# Patient Record
Sex: Male | Born: 1935 | Race: White | Hispanic: No | Marital: Married | Smoking: Former smoker
Health system: Southern US, Community
[De-identification: ages and names within clinical notes are randomized; demographics above are authoritative.]

## PROBLEM LIST (undated history)

## (undated) DIAGNOSIS — R05 Cough: Secondary | ICD-10-CM

## (undated) DIAGNOSIS — I639 Cerebral infarction, unspecified: Secondary | ICD-10-CM

## (undated) DIAGNOSIS — I219 Acute myocardial infarction, unspecified: Secondary | ICD-10-CM

## (undated) DIAGNOSIS — K219 Gastro-esophageal reflux disease without esophagitis: Secondary | ICD-10-CM

## (undated) DIAGNOSIS — F419 Anxiety disorder, unspecified: Secondary | ICD-10-CM

## (undated) DIAGNOSIS — R058 Other specified cough: Secondary | ICD-10-CM

## (undated) DIAGNOSIS — J40 Bronchitis, not specified as acute or chronic: Secondary | ICD-10-CM

## (undated) DIAGNOSIS — M2550 Pain in unspecified joint: Secondary | ICD-10-CM

## (undated) DIAGNOSIS — E119 Type 2 diabetes mellitus without complications: Secondary | ICD-10-CM

## (undated) DIAGNOSIS — J449 Chronic obstructive pulmonary disease, unspecified: Secondary | ICD-10-CM

## (undated) DIAGNOSIS — E785 Hyperlipidemia, unspecified: Secondary | ICD-10-CM

## (undated) DIAGNOSIS — M1990 Unspecified osteoarthritis, unspecified site: Secondary | ICD-10-CM

## (undated) HISTORY — DX: Anxiety disorder, unspecified: F41.9

## (undated) HISTORY — DX: Chronic obstructive pulmonary disease, unspecified: J44.9

## (undated) HISTORY — DX: Acute myocardial infarction, unspecified: I21.9

## (undated) HISTORY — DX: Pain in unspecified joint: M25.50

## (undated) HISTORY — DX: Cough: R05

## (undated) HISTORY — PX: LUMBAR DISC SURGERY: SHX700

## (undated) HISTORY — DX: Unspecified osteoarthritis, unspecified site: M19.90

## (undated) HISTORY — DX: Bronchitis, not specified as acute or chronic: J40

## (undated) HISTORY — DX: Hyperlipidemia, unspecified: E78.5

## (undated) HISTORY — DX: Other specified cough: R05.8

---

## 1998-04-30 ENCOUNTER — Ambulatory Visit (HOSPITAL_COMMUNITY): Admission: RE | Admit: 1998-04-30 | Discharge: 1998-04-30 | Payer: Self-pay | Admitting: Orthopedic Surgery

## 1998-05-14 ENCOUNTER — Observation Stay (HOSPITAL_COMMUNITY): Admission: RE | Admit: 1998-05-14 | Discharge: 1998-05-15 | Payer: Self-pay | Admitting: Orthopedic Surgery

## 1998-09-28 ENCOUNTER — Encounter: Payer: Self-pay | Admitting: Orthopedic Surgery

## 1998-10-02 ENCOUNTER — Encounter: Payer: Self-pay | Admitting: Orthopedic Surgery

## 1998-10-02 ENCOUNTER — Ambulatory Visit (HOSPITAL_COMMUNITY): Admission: RE | Admit: 1998-10-02 | Discharge: 1998-10-02 | Payer: Self-pay | Admitting: Orthopedic Surgery

## 1998-10-04 ENCOUNTER — Encounter: Payer: Self-pay | Admitting: Orthopedic Surgery

## 1998-10-04 ENCOUNTER — Observation Stay (HOSPITAL_COMMUNITY): Admission: RE | Admit: 1998-10-04 | Discharge: 1998-10-05 | Payer: Self-pay | Admitting: Orthopedic Surgery

## 1999-05-08 ENCOUNTER — Ambulatory Visit (HOSPITAL_COMMUNITY): Admission: RE | Admit: 1999-05-08 | Discharge: 1999-05-08 | Payer: Self-pay | Admitting: Cardiology

## 1999-09-25 ENCOUNTER — Encounter: Payer: Self-pay | Admitting: *Deleted

## 1999-09-26 ENCOUNTER — Ambulatory Visit: Admission: RE | Admit: 1999-09-26 | Discharge: 1999-09-26 | Payer: Self-pay | Admitting: *Deleted

## 1999-10-01 ENCOUNTER — Encounter: Payer: Self-pay | Admitting: *Deleted

## 1999-10-02 ENCOUNTER — Inpatient Hospital Stay (HOSPITAL_COMMUNITY): Admission: RE | Admit: 1999-10-02 | Discharge: 1999-10-04 | Payer: Self-pay | Admitting: *Deleted

## 1999-12-25 ENCOUNTER — Ambulatory Visit (HOSPITAL_COMMUNITY): Admission: RE | Admit: 1999-12-25 | Discharge: 1999-12-25 | Payer: Self-pay | Admitting: *Deleted

## 1999-12-25 ENCOUNTER — Encounter: Payer: Self-pay | Admitting: *Deleted

## 2001-09-15 DIAGNOSIS — I219 Acute myocardial infarction, unspecified: Secondary | ICD-10-CM

## 2001-09-15 HISTORY — PX: CORONARY ARTERY BYPASS GRAFT: SHX141

## 2001-09-15 HISTORY — DX: Acute myocardial infarction, unspecified: I21.9

## 2001-09-15 HISTORY — PX: FEMORAL ARTERY - FEMORAL ARTERY BYPASS GRAFT: SUR179

## 2002-04-15 ENCOUNTER — Encounter: Payer: Self-pay | Admitting: Cardiothoracic Surgery

## 2002-04-16 ENCOUNTER — Inpatient Hospital Stay (HOSPITAL_COMMUNITY): Admission: AD | Admit: 2002-04-16 | Discharge: 2002-04-23 | Payer: Self-pay | Admitting: Cardiology

## 2002-04-18 ENCOUNTER — Encounter: Payer: Self-pay | Admitting: Thoracic Surgery (Cardiothoracic Vascular Surgery)

## 2002-04-19 ENCOUNTER — Encounter: Payer: Self-pay | Admitting: Thoracic Surgery (Cardiothoracic Vascular Surgery)

## 2002-04-20 ENCOUNTER — Encounter: Payer: Self-pay | Admitting: Thoracic Surgery (Cardiothoracic Vascular Surgery)

## 2002-06-27 ENCOUNTER — Encounter (HOSPITAL_COMMUNITY): Admission: RE | Admit: 2002-06-27 | Discharge: 2002-09-25 | Payer: Self-pay | Admitting: Cardiology

## 2002-09-26 ENCOUNTER — Encounter (HOSPITAL_COMMUNITY): Admission: RE | Admit: 2002-09-26 | Discharge: 2002-09-29 | Payer: Self-pay | Admitting: Cardiology

## 2002-09-29 ENCOUNTER — Encounter (HOSPITAL_COMMUNITY): Admission: RE | Admit: 2002-09-29 | Discharge: 2002-12-28 | Payer: Self-pay | Admitting: Cardiology

## 2003-01-14 ENCOUNTER — Encounter (HOSPITAL_COMMUNITY): Admission: RE | Admit: 2003-01-14 | Discharge: 2003-04-14 | Payer: Self-pay | Admitting: Cardiology

## 2003-04-16 ENCOUNTER — Encounter (HOSPITAL_COMMUNITY): Admission: RE | Admit: 2003-04-16 | Discharge: 2003-07-15 | Payer: Self-pay | Admitting: Cardiology

## 2003-07-17 ENCOUNTER — Encounter (HOSPITAL_COMMUNITY): Admission: RE | Admit: 2003-07-17 | Discharge: 2003-10-15 | Payer: Self-pay | Admitting: Cardiology

## 2003-09-16 HISTORY — PX: TOTAL HIP ARTHROPLASTY: SHX124

## 2003-10-02 ENCOUNTER — Inpatient Hospital Stay (HOSPITAL_COMMUNITY): Admission: RE | Admit: 2003-10-02 | Discharge: 2003-10-07 | Payer: Self-pay | Admitting: Orthopedic Surgery

## 2004-01-30 ENCOUNTER — Encounter (HOSPITAL_COMMUNITY): Admission: RE | Admit: 2004-01-30 | Discharge: 2004-04-29 | Payer: Self-pay | Admitting: Cardiology

## 2004-05-16 ENCOUNTER — Encounter (HOSPITAL_COMMUNITY): Admission: RE | Admit: 2004-05-16 | Discharge: 2004-08-14 | Payer: Self-pay | Admitting: Cardiology

## 2004-06-12 ENCOUNTER — Ambulatory Visit (HOSPITAL_COMMUNITY): Admission: RE | Admit: 2004-06-12 | Discharge: 2004-06-12 | Payer: Self-pay | Admitting: Gastroenterology

## 2004-06-12 ENCOUNTER — Encounter (INDEPENDENT_AMBULATORY_CARE_PROVIDER_SITE_OTHER): Payer: Self-pay | Admitting: *Deleted

## 2004-08-15 ENCOUNTER — Encounter (HOSPITAL_COMMUNITY): Admission: RE | Admit: 2004-08-15 | Discharge: 2004-11-13 | Payer: Self-pay | Admitting: Cardiology

## 2004-12-14 ENCOUNTER — Encounter (HOSPITAL_COMMUNITY): Admission: RE | Admit: 2004-12-14 | Discharge: 2005-03-14 | Payer: Self-pay | Admitting: Cardiology

## 2005-02-26 ENCOUNTER — Ambulatory Visit (HOSPITAL_COMMUNITY): Admission: RE | Admit: 2005-02-26 | Discharge: 2005-02-26 | Payer: Self-pay | Admitting: Endocrinology

## 2005-03-18 ENCOUNTER — Encounter (HOSPITAL_COMMUNITY): Admission: RE | Admit: 2005-03-18 | Discharge: 2005-06-16 | Payer: Self-pay | Admitting: Cardiology

## 2005-06-17 ENCOUNTER — Encounter (HOSPITAL_COMMUNITY): Admission: RE | Admit: 2005-06-17 | Discharge: 2005-09-14 | Payer: Self-pay | Admitting: Cardiology

## 2005-09-16 ENCOUNTER — Encounter (HOSPITAL_COMMUNITY): Admission: RE | Admit: 2005-09-16 | Discharge: 2005-12-15 | Payer: Self-pay | Admitting: Cardiology

## 2005-12-16 ENCOUNTER — Encounter (HOSPITAL_COMMUNITY): Admission: RE | Admit: 2005-12-16 | Discharge: 2006-03-16 | Payer: Self-pay | Admitting: Cardiology

## 2006-03-17 ENCOUNTER — Encounter (HOSPITAL_COMMUNITY): Admission: RE | Admit: 2006-03-17 | Discharge: 2006-05-20 | Payer: Self-pay | Admitting: Cardiology

## 2008-05-26 ENCOUNTER — Ambulatory Visit: Payer: Self-pay | Admitting: Internal Medicine

## 2008-05-26 DIAGNOSIS — F411 Generalized anxiety disorder: Secondary | ICD-10-CM | POA: Insufficient documentation

## 2008-05-26 DIAGNOSIS — J449 Chronic obstructive pulmonary disease, unspecified: Secondary | ICD-10-CM | POA: Insufficient documentation

## 2008-07-25 ENCOUNTER — Ambulatory Visit: Payer: Self-pay | Admitting: Internal Medicine

## 2009-12-24 ENCOUNTER — Ambulatory Visit: Payer: Self-pay | Admitting: Surgery

## 2010-02-04 ENCOUNTER — Ambulatory Visit: Payer: Self-pay | Admitting: Surgery

## 2011-01-28 NOTE — Assessment & Plan Note (Signed)
OFFICE VISIT   ROLLINS, WRIGHTSON  DOB:  1935/06/15                                       02/04/2010  EAVWU#:[id number]   Patient returns today for follow-up of bilateral claudication.  He is a  former patient of Dr. Madilyn Fireman, who has been having calf pain for the past  6 months.  He is having difficulty to get to his mailbox, which is 75  feet away.  The right leg is equal to the left.  His pain is alleviated  with resting.  He has a right-to-left femoral-femoral bypass graft  placed by Madilyn Fireman in 2001 for a left external iliac occlusion.  He has a  known contrast reaction consisting of itching and eye swelling.  He has  had a subsequent catheterization, where premedication prevented him from  having a problem.  When I saw him last, I had given him a prescription  for Pletal to see how this would manage his symptoms.  He said with  taking the medicine, he did have relief of his symptoms.  However, he  could not continue the medicine due to headaches.   PHYSICAL EXAMINATION:  Heart rate is 88, blood pressure 122/68, O2 sat  98%.  Generally, he is well-appearing in no distress.  HEENT:  Within  normal limits.  Respirations are nonlabored.  Extremities are warm and  well-perfused.  No ulceration.  Pedal pulses are not palpable.  Neuro:  He has a nonfocal exam.   DIAGNOSTIC STUDIES:  I have independently reviewed his ultrasound.  This  reveals patent right-to-left femoral-femoral bypass graft.  There are  elevated velocities within the right superficial femoral artery.  ABI on  the right is 0.81.  On the left is 0.85.   ASSESSMENT:  Bilateral claudication.   PLAN:  Since the patient had a good response to full-dose Pletal but had  to come off the medication secondary to side effects, I feel like it is  reasonable to proceed with cutting the dose in half and seeing if this  helps to manage his symptoms.  I told him to give this a 3-week period,  assuming he does  not have any further side effects and to see how his  symptoms are.  If he is still not tolerating this, we will schedule him  for an arteriogram with a bilateral runoff.  He will need to be  premedicated with prednisone, Benadryl and Pepcid prior to this  procedure to prevent his contrast reaction.     Jorge Ny, MD  Electronically Signed   VWB/MEDQ  D:  02/04/2010  T:  02/05/2010  Job:  [id number]   cc:   Georga Hacking, M.D.  Veverly Fells. Altheimer, M.D.

## 2011-01-28 NOTE — Procedures (Signed)
BYPASS GRAFT EVALUATION   INDICATION:  Follow up fem-fem bypass graft.   HISTORY:  Diabetes:  No.  Cardiac:  CABG.  Hypertension:  Yes.  Smoking:  Previous.  Previous Surgery:  Fem-fem bypass graft in 2001.   SINGLE LEVEL ARTERIAL EXAM                               RIGHT              LEFT  Brachial:                    149                157  Anterior tibial:             87                 133  Posterior tibial:            127                127  Peroneal:  Ankle/brachial index:        0.81               0.85   PREVIOUS ABI:  Date: 12/24/09  RIGHT:  0.87  LEFT:  0.90   LOWER EXTREMITY BYPASS GRAFT DUPLEX EXAM:   DUPLEX:  Patent right fem-fem bypass graft.  Elevated velocities noted  in the right SFA in the proximal region of 412 cm/s and in the mid  region of 298 cm/s.   IMPRESSION:  1. Ankle brachial indices bilaterally suggest mild disease with      biphasic waveforms on the left and monophasic waveforms on the      right.  2. Patent femoral-femoral bypass graft with elevated velocities in the      right superficial femoral artery proximal and mid section, as noted      above.   ___________________________________________  V. Charlena Cross, MD   NT/MEDQ  D:  02/04/2010  T:  02/04/2010  Job:  [id number]

## 2011-01-28 NOTE — Assessment & Plan Note (Signed)
OFFICE VISIT   Ronald Frank, Ronald Frank  DOB:  03-22-1935                                       12/24/2009  ZOXWR#:[id number]   REASON FOR VISIT:  Bilateral leg pain.   HISTORY:  Patient is a very pleasant 76 year old gentleman, former  patient of Dr. Madilyn Fireman that I am seeing at the request of Dr. Leslie Dales  for bilateral leg pain.  The patient states that for the past 6 months,  he has been having calf cramping, only when he walks out to his mailbox,  which is approximately 75 feet away.  The right leg cramping is equal to  that in the left leg.  It is alleviated with rest.  The patient has a  history of undergoing a right-to-left femoral-femoral bypass graft by  Dr. Madilyn Fireman in 2001 for left external iliac occlusion.  During the time of  his procedure in 2001, he did have a contrast reaction, which consist of  itching and eye-swelling.  It was treated with Benadryl.  He has  subsequent had a cardiac catheterization, which he was premedicated for  and did not have any problems.   The patient has a history of coronary artery disease and suffered a  heart attack in 2003, at which time he underwent open heart surgery.  He  also suffers from COPD.  He is a diabetic, which is managed medically.  His blood sugars run in the 90-120 range.  Diabetes was diagnosed in  2010.  He suffers from hyperlipidemia, which is managed medically.   REVIEW OF SYSTEMS:  GENERAL:  Negative fevers, chills, weight gain,  weight loss.  CARDIAC:  Positive chest pain and shortness breath on exertion.  PULMONARY:  Positive for bronchitis.  GI:  Negative.  GU:  Negative.  VASCULAR:  Pain in the legs with walking.  NEURO:  Positive for dizziness.  MUSCULOSKELETAL:  Positive for arthritis and muscle pain.  PSYCH:  Positive for anxiety.  SKIN:  Positive for a rash on his arms.  EENT:  Negative.  HEME:  Negative.   PAST MEDICAL HISTORY:  Positive for type 2 diabetes, hyperlipidemia,  coronary  artery disease, peripheral artery disease, COPD, vitamin D  deficiency,  benign prostatic hypertrophy, osteoarthritis,  diverticulitis, chronic insomnia.   FAMILY HISTORY:  Negative for cardiovascular disease at an early age.   SOCIAL HISTORY:  He is married with 2 children.  He works as a [employer] officer.  He has a history smoking, quit in 2003.   ALLERGIES:  Erythromycin, Lipitor, Actos, Lexapro, Contrast dye.   MEDICATIONS:  Please see medical record.   PHYSICAL EXAMINATION:  Heart rate is 85, blood pressure is 135/79, O2  sats are 98%.  Generally, he is well-appearing in no distress.  HEENT:  Within normal limits.  Lungs are clear bilaterally.  Cardiovascular:  Regular rate and rhythm.  Abdomen:  Soft, nontender, obese.  Musculoskeletal is without major deformities.  Neuro:  He has no focal  weakness or paresthesias.  Vascular:  He has a palpable femoral-femoral  bypass graft.  Feet are warm and well-perfused.  No ulceration.   Diagnostic studies also have been independently reviewed.  ABI on the  right is 0.87.  The left is 0.90.  He has monophasic waveforms on the  right and biphasic on the left.   ASSESSMENT:  Bilateral leg pain.  PLAN:  The patient's description of his symptoms appear classic for  peripheral vascular disease.  However, by ultrasound, his disease is not  that significant.  He has a history of a femoral-femoral bypass graft  which remains patent.  I suspect that if we exercise the patient, his  ABIs would go down.  Regardless, the severity of his symptoms is not  lifestyle-limiting and with his contrast dye allergy, I have recommended  treating him medically first.  I am going to add cilostazol to his  medication list.  He does not have a history of congestive failure and  has no signs of congestive failure at this time.  I am going to give him  6 week's worth of a trial and then reevaluate at that time.  When he  comes back in for reevaluation, he will have a  formal arterial study to  evaluate for stenosis within his bypass graft as well as for inflow-  outflow disease.  If he has had no relief from the cilostazol and wishes  to have an intervention done, we can consider getting an arteriogram in  6 weeks.     Jorge Ny, MD  Electronically Signed   VWB/MEDQ  D:  12/24/2009  Frank:  12/25/2009  Job:  [id number]   cc:   Georga Hacking, M.D.  Veverly Fells. Altheimer, M.D.

## 2011-01-31 NOTE — Op Note (Signed)
Ronald Frank, RORIE NO.:  [id number]   MEDICAL RECORD NO.:  [id number]                   PATIENT TYPE:  INP   LOCATION:  2020                                 FACILITY:  [facility]   PHYSICIAN:  Salvatore Decent. Dorris Fetch, M.D.         DATE OF BIRTH:  Feb 19, 1935   DATE OF PROCEDURE:  04/18/2002  DATE OF DISCHARGE:                                 OPERATIVE REPORT   PREOPERATIVE DIAGNOSIS:  Left main disease with unstable angina.   POSTOPERATIVE DIAGNOSIS:  Left main disease with unstable angina.   PROCEDURE:  Median sternotomy, extracorporeal circulation, coronary artery  bypass grafting x4  (left internal mammary to left anterior descending,  saphenous vein graft to ramus intermedius, sequential saphenous vein graft  to first and second obtuse marginal).   SURGEON:  Salvatore Decent. Dorris Fetch, M.D.   ASSISTANT:  Jody P. Diamond Nickel.   ANESTHESIA:  General.   FINDINGS:  Good quality targets, good quality conduits.  Left PDA too small  to graft.   INDICATIONS:  Mr. Holderness is a 76 year old gentleman who presented with  unstable angina.  He underwent cardiac catheterization which revealed a left  dominant circulation with significant left main stenosis.  He also had an  ostial circumflex lesion.  The patient was referred for coronary artery  bypass graft.  The indications, risks, benefits and alternative treatments  were discussed in detail with the patient.  He understood and accepted the  risks associated with coronary artery bypass grafting and agreed to proceed.   DESCRIPTION OF PROCEDURE:  Mr. Butler was brought to the preop holding area  on April 18, 2002.  Lines were placed to monitor, central venous and  pulmonary artery pressure.  EKG leads were placed for continuous telemetry.  The patient was taken to the operating room, anesthetized and intubated.  A  Foley catheter was placed.  Intravenous antibiotics were administered.  The  chest, abdomen,  and legs were prepared and draped in the usual fashion.   A median sternotomy then was  performed.  The left internal mammary artery  was harvested using standard technique.  Simultaneously an incision was made  in the medial aspect of the right leg and the greater saphenous vein was  harvested from the ankle to the knee.  Of note, the saphenous vein and  mammary artery were of good quality.  The patient was fully heparinized  prior to dilating the distal end of the mammary artery.   The pericardium was opened, and the ascending aorta was inspected and  palpated.  There was no palpable atherosclerotic disease.  The aorta was  cannulated via concentric 2-0 Ethibond nonpledgeted pursestring sutures.  A  dual staged venous cannula was placed via pursestring suture in the right  atrial appendage.  Cardiopulmonary bypass was instituted, and the patient  was cooled at 32 degrees Celsius.  The coronary arteries were inspected and  there anastomotic sites chosen.  The conduits were inspected and cut to  length.  foam pad was placed in the pericardium to protect the left phrenic nerves.  A temperature probe was placed in the myocardial septum and a cardioplegia  cannula was placed in the ascending aorta.   The aorta was cross-clamped.  The left ventricle was entered via the aortic  root.  Cardiac arrest then was achieved with a combination of cold antegrade  cardioplegia. and topical iced saline.  After achieving complete diastolic  arrest and myocardial septal temperature of 12 degrees Celsius, the  following distal anastomosis were performed.   First, a reverse saphenous vein graft was placed end-to-side to the ramus  intermedius.  This was a 1.5 mm good quality vessel.  The anastomosis was  performed with a running 7-0 Prolene suture in an end-to-side fashion.  At  its completion, this anastomosis and all subsequent ones were probed  proximally and distally to verify patency.  The graft  flushed easily.  Cardioplegia was administered, and there was good hemostasis.   Next, a reverse saphenous vein graft was placed sequentially to obtuse  marginals I and II.  These arose as a large common dominant lateral branch.  There was plaque at the bifurcation of this vessel involving the takeoff of  OM-I.  Therefore both distal branches were grated.  A side-to-side  anastomosis was performed to the first was performed to the first branch  using a running 7-0 Prolene suture, and an end-to-side was performed to the  second branch.  Both were performed using running 7-0 Prolene sutures.  The  graft flushed easily.  Again, cardioplegia was administered was  administered, and there was good hemostasis.   Next, the left internal mammary artery was brought through the window in the  pericardium and the distal end of the mammary artery was spatulated.  It was  anastomosed end-to-side to the distal LAD.  The LAD was a diffusely diseased  vessel.  There was palpable plaque throughout its course.  The site of the  anastomosis was of good quality.  It was 2 mm in diameter.  The mammary  artery was  approximately 2.5 mm in diameter.  The anastomosis was performed  with a running 8-0 Prolene suture in an end-to-side fashion.  After  completion of the mammary to the LAD anastomosis, the clamps, the bulldog  clamps were briefly removed to check for hemostasis but then was replaced.   The vein grafts were cut to length.  The cardioplegia cannula was removed  from the ascending aorta.  The proximal vein graft anastomosis were  performed to 4.0 mm punch aortotomies with running 6-0 Prolene sutures.  At  the completion of the final proximal anastomosis, the patient was placed in  Trendelenburg position.  The bulldog clamp was removed from the mammary  artery.  Immediate and rapid septal rewarming was noted.  Lidocaine was administered.  The aortic root was deaired.  The cross-clamp was removed.  The  total cross-clamp time was 57 minutes.   All proximal and distal anastomoses were inspected for hemostasis.  Epicardial pacing wires were placed on the right ventricle and right atrium.  The patient did not require defibrillation but was bradycardiac and required  DDD sequential pacing.  After the patient had been rewarmed to a core  temperature of 37 degrees Celsius, he was weaned from cardiopulmonary bypass  without difficulty.  He was DDD paced for rate and then on no inotropic  support at the time of  separation from bypass.  Total bypass time was 111  minutes.   The initial cardiac index was greater than 2 liters per minute per sq m. and  the patient remained hemodynamically stable throughout the post bypass  period.  A test dose of Protamine was administered and was well tolerated.  The atrial and aortic cannulae were removed.  The remainder of the Protamine  was administered without incident.  The chest was irrigated with one liter  of warm normal saline containing 1 g of vancomycin.  Hemostasis was  achieved.  The left pleural and two mediastinal pleural and two mediastinal  chest tubes were placed for through separate subcostal incisions.  The  pericardium was loosely reapproximated with interrupted 3-0 silk sutures  that came together easily without tension.  The sternum was closed with  heavy gauge interrupted stainless steel wires.  The pectoralis fascia was  closed with a running #1 Vicryl suture.  The subcutaneous tissue was closed  with a running 2-0  Vicryl suture, and the skin was closed with a 3-0 Vicryl subcuticular  suture.  All sponge, needle and instrument counts were correct at the end of  the procedure.  The patient was taken from the operating room to the  surgical intensive care unit intubated and in stable condition.                                               Salvatore Decent Dorris Fetch, M.D.    SCH/MEDQ  D:  04/18/2002  T:  04/22/2002  Job:  [id number]   cc:    W. Ashley Royalty., M.D.

## 2011-01-31 NOTE — Discharge Summary (Signed)
[facility]  Patient:    Ronald Frank                         MRN: [id number] Adm. Date:  [id number] Disc. Date: 10/04/99 Attending:  Melvenia Needles Dictator:   Loura Pardon, P.A. CC:         Denman George, M.D.             Darden Palmer., M.D.             Veverly Fells. Altheimer, M.D.                           Discharge Summary  DATE OF BIRTH:  Jun 28, 1935  FINAL DIAGNOSES: 1. 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 2. Left lower extremity claudication.  SECONDARY DIAGNOSES: 1. History of long-term, continued tobacco habituation. 2. Degenerative joint disease. 3. Anxiety. 4. Insomnia. 5. Hypercholesterolemia. 6. Status post lumbar surgery x 2 in 1999 and in 2000. 7. 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  PROCEDURES:  October 02, 1999, right-to-left femoral-to-femoral bypass with placement of 8 mm Sulzer vascular Dacron graft.  SURGEON:  Dr. Denman George.  DISPOSITION:  Ronald Frank is judged a suitable candidate for discharge on  postoperative day #2.  He has been afebrile in the postoperative period.  His appetite is good.  He has full GI tract function.  His mental status is clear. His incisions are healing nicely in both groins.  He has palpable pedal pulses bilaterally.  He is ambulating independently.  DISCHARGE MEDICATIONS: 1. Percocet 5/325 1-2 tablets p.o. q.4-6h. p.r.n. pain. 2. Vioxx 25 mg daily. 3. Nicoderm patch 21 mg per day, apply daily to the upper outer arm. 4. Pravachol 40 mg at bedtime. 5. Ambien 10 mg at bedtime. 6. Viagra as needed. 7. Lorazepam 0.5 mg p.o. b.i.d.  ACTIVITY:  Ambulation as tolerated.  He is asked not to drive while taking Percocet or Vicodin.  DIET:  Low sodium, low cholesterol.  WOUND CARE:  He may shower daily, keeping his incisions clean and dry.  FOLLOW-UP:  Office visit [facility] on Monday, October 14, 1999, at 10 a.m. for staple removal.  He has an office visit scheduled with Dr. Madilyn Fireman for two weeks after discharge.  This will be arranged October 14, 1999.  HISTORY OF PRESENT ILLNESS:  Ronald Frank is a 76 year old male with a history of left lower extremity claudication.  He presented in October 2000, with complaints of claudication symptoms originally.  Doppler studies showed ankle brachial indices of 0.63 and 1.0 on the right.  He was scheduled for an angiogram to further evaluate the condition.  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  An angiogram was performed September 25, 1999.  The study showed left iliac artery occlusion, which is consistent with left lower extremity claudication.  Ankle brachial indices were retaken on October 01, 1999.  On the left they were 0.51 nd on the right 1.0.  His claudication symptoms have increased since October 2000.  They start after he walks about one block.  He has occasional rest pain and pain at night.  He presents for surgery electively on October 02, 1999.  HOSPITAL COURSE:  Ronald Frank was admitted to  [facility] on October 02, 1999.  He underwent right-to-left femoral-to-femoral bypass the same day.  He tolerated the procedure well, and was transferred to the recovery room in stable and satisfactory condition.  His postoperative course is as mentioned in the discharge disposition.  He has palpable pedal pulses.  He is ambulating independently.  He has no recurrence of left lower extremity claudication symptoms. His graft has a palpable pulse.  He is taking all nourishment well and tolerating it.  His mental status has been clear.  His incisions are healing nicely.  He will go home on the medications as dictated, with follow-up with Dr. Madilyn Fireman. DD:  10/03/99 TD:  10/04/99 Job: [id number]

## 2011-01-31 NOTE — Cardiovascular Report (Signed)
NAMEROMELO, SCIANDRA NO.:  [id number]   MEDICAL RECORD NO.:  [id number]                   PATIENT TYPE:  INP   LOCATION:  2303                                 FACILITY:  [facility]   PHYSICIAN:  W. Ashley Royalty., M.D.         DATE OF BIRTH:  1935-03-25   DATE OF PROCEDURE:  04/14/2002  DATE OF DISCHARGE:                              CARDIAC CATHETERIZATION   HISTORY:  A 76 year old male with peripheral vascular disease, cigarette  smoking, and a recent chest pain history that has occurred at rest.   PROCEDURE:  Left heart catheterization with coronary angiograms and left  ventriculogram.   COMMENTS ABOUT PROCEDURE:  The patient was premedicated with prednisone,  Pepcid, and Benadryl and tolerated the procedure well.  The procedure was  done through the right groin.  There was significant catheter  ventricularization and damping with both the standard 6 [place] and 5 [place]  catheters.  Multiple attempts were made, but there was severe damping and  ventricularization with all left coronary injections despite angulation and  views.  He tolerated the procedure well and following the procedure had good  hemostasis and peripheral pulses present.   HEMODYNAMIC DATA:  1. Aorta postcontrast 116/52.  2. LV postcontrast 116/8.   ANGIOGRAPHIC DATA::  1. Left ventriculogram:  Performed in the 30 degree RAO projection.  The     aortic valve was normal.  The mitral valve was normal. The left ventricle     appears normal in size.  The estimated ejection fraction is 55-60%.     Coronary arteries arise and distribute normally.  Calcifications noted in     the left coronary system.  This system is left dominant.  2. Left main coronary artery:  Diffuse 60-70% narrowing with calcification.  3. Left anterior descending:  There is moderate irregularity proximally.     There is moderate diffuse disease in the mid portion with a     calcification.  4. Circumflex  coronary artery dominant vessel:  There is an ostial 80%     stenosis.  The vessel is dominant with several marginal branches in the     posterolateral branch noted.   Right coronary artery is a nondominant vessel which is congenitally small  and __________ in right ventricular branches.    IMPRESSION:  1. Significant left main coronary artery disease  2. Ostial circumflex coronary artery disease with dominant circulation.   RECOMMENDATIONS:  Consideration of a coronary artery bypass grafting.                                               Darden Palmer., M.D.    WST/MEDQ  D:  04/14/2002  T:  04/19/2002  Job:  [id number]   cc:   Veverly Fells. Altheimer, M.D.  Cardiac Cath Lab

## 2011-01-31 NOTE — Discharge Summary (Signed)
Ronald Frank, Ronald Frank NO.:  [id number]   MEDICAL RECORD NO.:  [id number]                   PATIENT TYPE:  INP   LOCATION:  2020                                 FACILITY:  [facility]   PHYSICIAN:  Salvatore Decent. Dorris Fetch, M.D.         DATE OF BIRTH:  05/08/1935   DATE OF ADMISSION:  04/14/2002  DATE OF DISCHARGE:                                 DISCHARGE SUMMARY   ADMISSION DIAGNOSES:  Coronary artery disease.   SECONDARY DIAGNOSES:  1. Hypertension.  2. Postoperative anemia secondary to blood loss.  3. __________.   DISCHARGE DIAGNOSES:  Coronary artery disease.   HISTORY OF PRESENT ILLNESS:  The patient was admitted to [facility]  on April 14, 2002, and underwent cardiac catheterization.  Catheterization  revealed the patient had significant coronary artery disease.  Because of  this Dr. Dorris Fetch was consulted.  On April 18, 2002, the patient  underwent a coronary artery bypass graft x4 to the left internal mammary  artery anastomosed to the left anterior descending artery, saphenous vein  graft to the ramus artery and sequential saphenous vein graft to the first  and second obtuse marginal arteries.  No complications were noted during the  procedure.  Postoperatively the patient had a relatively uneventful hospital  course.  His hemoglobin and hematocrit were stable at 10 and 31,  respectively.  BUN and creatinine were 16 and 1.1.  He did have some  postoperative nausea.  This was alleviated on its own without any  intervention.  He was subsequently deemed stable for discharge home on  postoperative day #5, April 23, 2002.  No new medications at the time of  discharge.   DISCHARGE MEDICATIONS:  1. Vicodin 1-2 tablets p.o. q.4-6h. as needed for pain.  2. Aspirin 325 mg one daily.  3. Wellbutrin 150 mg one twice daily.  4. Toprol XL 25 mg one daily.  5. Pravachol 40 mg one daily.   DISCHARGE ACTIVITIES:  The patient was told no  driving, strenuous activity  or lifting heavy objects.  He was told to walk daily and continue bending  exercises.   DISCHARGE DIET:  Low-fat and low-salt.   WOUND CARE:  The patient was told he could shower and clean his incisions  with soap and water.   DISPOSITION:  To home.    FOLLOWUP APPOINTMENTS:  The patient was told to call his cardiologist, Dr.  __________, for a two-week appointment.  The patient was told to see Dr.  Dorris Fetch in his office in approximately three weeks.  He is also to call  and verify the time of his appointment.  He was told to bring a chest x-ray  with him.     Levin Erp. Steward, P.A.                      Salvatore Decent Dorris Fetch, M.D.    BGS/MEDQ  D:  04/23/2002  T:  04/27/2002  Job:  [id number]   cc:   Darden Palmer., M.D.

## 2011-01-31 NOTE — Discharge Summary (Signed)
NAMEEKANSH, SHERK NO.:  [id number]   MEDICAL RECORD NO.:  [id number]                   PATIENT TYPE:  INP   LOCATION:  0452                                 FACILITY:  [facility]   PHYSICIAN:  Georges Lynch. Darrelyn Hillock, M.D.             DATE OF BIRTH:  1934-12-26   DATE OF ADMISSION:  10/02/2003  DATE OF DISCHARGE:  10/07/2003                                 DISCHARGE SUMMARY   ADMISSION DIAGNOSES:  1. Degenerative arthritis, left hip.  2. Anxiety.  3. Coronary artery disease.  4. Chronic obstructive pulmonary disease.  5. Hypertension.  6. Benign prostatic hypertrophy.  7. Hypercholesterolemia.   DISCHARGE DIAGNOSES:  1. Degenerative arthritis, left hip, status post left total hip     arthroplasty.  2. Anxiety.  3. Coronary artery disease.  4. Chronic obstructive pulmonary disease.  5. Degenerative arthritis.  6. Hypertension.  7. Benign prostatic hypertrophy.  8. Hypercholesterolemia.   PROCEDURES:  The patient was taken to the operating room on October 02, 2003  to undergo a left total hip arthroplasty.  Surgeon was Ranee Gosselin, M.D.  Assistant was Marlowe Kays, M.D.  Surgery was performed under spinal  anesthesia.   LABORATORY DATA:  Admission CBC revealed WBC 7.6, RBC 4.33, hemoglobin 14.3,  hematocrit 41.6, platelet count 262.  Admission PT 12.3, INR 0.9, PTT 32.  Admission chemistry revealed sodium high at 148, potassium 4.8, chloride  111, CO2 29, glucose elevated at 143, BUN 17, creatinine 1.3, calcium 9.4,  albumin 4.1.  Admission urinalysis is normal.  The patient's blood type is O  positive, negative antibody screen.  Preoperative x-ray of the left hip  revealed advanced osteoarthritis of left hip.  Postoperative x-ray of the  left hip revealed prosthesis to be in good alignment.  Preoperative chest x-  ray and EKG - unable to locate on medical record.   HOSPITAL COURSE:  The patient was admitted to [facility] and  taken  to the operating room.  He underwent the above-stated procedure without  complication.  The patient tolerated the procedure well, was returned to the  recovery room, and then to the orthopedic floor to continue his  postoperative care.  PCA was ordered for pain control.  The patient's pain  was very well controlled, and he was weaned over to oral analgesics, and the  PCA was discontinued on postoperative day #2.  PT was consulted for gait  training ambulation.  The patient was very slow to progress, fatigued very  easily, and it was felt that he could possibly benefit from a __________  rehabilitation admission.  He was evaluated for admission, but, with no beds  available, he was unable to be transferred.  The patient's hemoglobin and  hematocrit were followed throughout his hospitalization.  He did have a  postoperative decrease in his hemoglobin, but that stabilized prior to blood  transfusion, and his discharge hemoglobin was  10.2.  The patient continued  to work with the therapist, and he was able to gain some strength, was able  to ambulate greater than 100 feet by postoperative day #5, and it was felt  that he could safely be discharged home.   DISPOSITION:  The patient was discharged home on October 07, 2003.   DISCHARGE MEDICATIONS:  1. Percocet 10/650, 1-2 q.6h. as needed for pain.  2. Robaxin 500 mg one q.4-6h. as needed for muscle spasm.  3. Coumadin 2 mg daily.  4. Keflex 500 mg four times daily for 1 week.   DIET:  As tolerated.   ACTIVITY:  Touchdown weightbearing with walker.  [facility] for home care.   FOLLOW UP:  The patient is scheduled to follow up with Dr. Darrelyn Hillock 2 weeks  from the date of surgery.   WOUND CARE:  The patient should keep the wound dry and clean.   CONDITION AT THE TIME OF DISCHARGE:  Improved.     Ronald Frank. Paitsel, P.A.                     Ronald A. Darrelyn Hillock, M.D.    Tilden Dome  D:  11/08/2003  T:  11/08/2003  Job:  [id number]

## 2011-01-31 NOTE — Op Note (Signed)
NAMERIKER, COLLIER NO.:  [id number]   MEDICAL RECORD NO.:  [id number]          PATIENT TYPE:  AMB   LOCATION:  ENDO                         FACILITY:  [facility]   PHYSICIAN:  Graylin Shiver, M.D.   DATE OF BIRTH:  1934/09/27   DATE OF PROCEDURE:  06/12/2004  DATE OF DISCHARGE:                                 OPERATIVE REPORT   PROCEDURE PERFORMED:  Colonoscopy.   INDICATIONS FOR PROCEDURE:  Screening.   Informed consent was obtained after explanation of the risks of bleeding,  infection, and perforation.   PREMEDICATIONS:  Fentanyl 100 mcg  IV, Versed 10 mg IV.   DESCRIPTION OF PROCEDURE:  With the patient in the left lateral decubitus  position, a rectal exam was performed and no masses were felt.  The Olympus  colonoscope was inserted into the rectum and advanced around the colon to  the cecum.  Cecal landmarks were identified.  The cecum and ascending colon  were normal.  The transverse colon normal.  The descending colon and sigmoid  revealed a few scattered diverticula.  The rectum revealed a 3 mm polyp  biopsied off with cold forceps.  The patient tolerated the procedure well  without complications.   IMPRESSION:  1.  Diverticulosis.  2.  Small rectal polyp, 2114.   PLAN:  The pathology will be checked.       SFG/MEDQ  D:  06/12/2004  T:  06/12/2004  Job:  [id number]   cc:   Veverly Fells. Altheimer, M.D.  [address]  [place]  [place] [address]  Fax: [phone]

## 2011-01-31 NOTE — Op Note (Signed)
NAMEJAGAR, LUA NO.:  [id number]   MEDICAL RECORD NO.:  [id number]                   PATIENT TYPE:  INP   LOCATION:  0452                                 FACILITY:  [facility]   PHYSICIAN:  Georges Lynch. Gioffre, M.D.             DATE OF BIRTH:  Jun 20, 1935   DATE OF PROCEDURE:  10/02/2003  DATE OF DISCHARGE:                                 OPERATIVE REPORT   PREOPERATIVE DIAGNOSIS:  Severe degenerative arthritis, left hip.   POSTOPERATIVE DIAGNOSIS:  Severe degenerative arthritis, left hip.   OPERATION:  Left total hip arthroplasty utilizing the [employer] system.  This is a porous coated-type prosthesis.  The sizes used were a size 56 mm  Trident cup with two cancellous bone screws for fixation.  The insert was a  10 degree polyethylene insert, 36 mm diameter.  The femoral component was a  size 10 primary Secure Fit Plus stem.  The C-tapered head was a +5 C-tapered  head.   SURGEON:  Georges Lynch. Darrelyn Hillock, M.D.   ASSISTANT:  Marlowe Kays, M.D.   DESCRIPTION OF PROCEDURE:  Under spinal anesthesia, routine orthopedic prep  and draping of the left hip was carried out.  The patient had 1 g of IV  Ancef.  The incision was made over the posterolateral aspect of the left  hip, the self-retaining retractor were inserted, bleeders identified and  cauterized.  I then incised the iliotibial band.  I then dissected the  distal gluteal muscles by blunt dissection.  Bleeders identified and  cauterized.  At all times great care was taken not to injure the sciatic  nerve.  Following this I did a partial release of the Zickel band.  I then  detached the external rotators in the usual fashion with great care taken to  protect the sciatic nerve.  Following this I did a capsulectomy, dislocated  the hip and amputated the femoral head at the appropriate neck length.  At  this time a guide pin was inserted down into the femoral canal.  An initial  drill hole was made, and  I then reamed and rasped the femoral shaft up to a  size 10 Secure Fit stem, porous-coated.  The distal tip was reamed to a  14.5.  Following this I then prepared the acetabulum by completing the  capsulectomy.  I then reamed the acetabulum up to a 54 mm in diameter.  I  then went through the appropriate trials in the usual fashion.  We finally  selected a +5 C-tapered head because it was the most stable, and it was a 36  mm in diameter head.  We then removed the trials, inserted our permanent  Trident cup.  I inserted the trial insert and went through trials once again  for positioning purposes.  We had excellent position and excellent  stability.  I then removed the trial insert and then utilized two cancellous  screws for fixation of the cup.  Following this I inserted a 10 degree  polyethylene insert at the appropriate angle.  I did utilize the bur to bur  down the medial wall of the acetabulum in order to get a nice secure fit  with the cup.  I then inserted the permanent cup, as I mentioned.  We then  went down and inserted our permanent size 10 Secure Fit femoral stem.  We  then went through trials once again and selected a +5 C-tapered head because  of stability purposes.  We then removed the trial and inserted our permanent  36 mm diameter +5 C-tapered head, reduced the hip after we cleared the  acetabulum, took the hip through motion, and we had excellent stability.  We  thoroughly  irrigated out the area.  I reattached the external rotators and closed the  main part of the wound in layers in the usual fashion.  The skin was closed  with metal staples.  A sterile Neosporin dressing was applied.  He had 1 g  of IV Ancef preop.                                               Ronald A. Darrelyn Hillock, M.D.    RAG/MEDQ  D:  10/02/2003  T:  10/02/2003  Job:  [id number]

## 2011-01-31 NOTE — Consult Note (Signed)
NAMEGREENE, DIODATO NO.:  [id number]   MEDICAL RECORD NO.:  [id number]                   PATIENT TYPE:  OIB   LOCATION:  3707                                 FACILITY:  [facility]   PHYSICIAN:  Salvatore Decent. Dorris Fetch, M.D.         DATE OF BIRTH:  02/07/1935   DATE OF CONSULTATION:  04/14/2002  DATE OF DISCHARGE:                       CARDIOVASCULAR/THORACIC CONSULTATION   REASON FOR CONSULTATION:  Left main disease.   CHIEF COMPLAINT:  Chest pain.   HISTORY OF PRESENT ILLNESS:  The patient is a 76 year old gentleman with a  history of tobacco abuse, peripheral vascular disease, anxiety and  hypercholesterolemia.  He presents with a two to three-week history of chest  pain.  He says it starts in the mid to left side of his chest, radiates  around towards the back and often affects his left arm as well.  This is  usually exertional although he has had a couple of episodes that occurred at  rest.  He has taken aspirin and the pain resolves.  He was seen in  consultation by Dr. Viann Fish and cardiac catheterization was  recommended.  Cardiac catheterization performed today revealed 70% left main  stenosis and 80% ostial left circumflex in a left dominant circulation.  His  ejection fraction was approximately 55% with apical hypokinesis.  The  patient has not experienced nausea, vomiting or diaphoresis with his  discomfort.  He has experienced shortness of breath.  He also has exertional  shortness of breath.  He denies wheezing.  He denies orthopnea, paroxysmal  nocturnal dyspnea or peripheral edema.  He has had no syncope or presyncope.   MEDICATIONS ON ADMISSION:  1. Ativan 0.5 mg p.o. b.i.d.  2. Aspirin 325 mg p.o. q.d.  3. Vicodin 5/500 one to two p.o. b.i.d.  4. __________  80 mg p.o. q.d.  5. Norvasc 5 mg p.o. q.d.  6. Toprol 25 mg p.o. q.d.  7. Zetia 10 mg p.o. q.d.  8. Zyban 150 mg p.o. b.i.d.   ALLERGIES:  Erythromycin.   PAST  MEDICAL HISTORY:  Peripheral vascular disease, status post right to  left fem-fem bypass in 2001.  History of long-term tobacco abuse.  Degenerative joint disease.  Anxiety.  Insomnia.  Hypercholesterolemia.  Hypertension.  Previous lumbar surgery times two.  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   FAMILY HISTORY:  No coronary disease.   SOCIAL HISTORY:  The patient is married.  He has two children and six  grandchildren.  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  He smoked a  pack and a half of cigarettes for 45 years.  He does not use ethanol.   REVIEW OF SYSTEMS:  No history of stroke or transient ischemic attack.  No  abnormal bleeding or clotting.  No claudication.  Denies wheezing or  frequent bronchitis.  All other systems are negative.   PHYSICAL EXAMINATION:  GENERAL:  The patient is  a 76 year old white male who  is well-developed, well-nourished and in no acute distress.  VITAL SIGNS:  Blood pressure is 124/64, pulse is 80, respirations are 16.  NEUROLOGIC:  He is alert and oriented times three and grossly intact.  HEENT:  He has had previous ear surgery.  Otherwise unremarkable.  NECK:  Supple with a faint right carotid bruit.  No thyromegaly or  adenopathy.  CARDIAC:  Regular rate and rhythm, normal S1 and S2, no gross murmurs or  gallops.  LUNGS:  Clear to auscultation and percussion with no wheezing bilaterally.  ABDOMEN:  Benign.  EXTREMITIES:  Without cyanosis, clubbing or edema.  He has 2+ dorsalis pedis  pulses bilaterally, 2+ radial pulses bilaterally.  SKIN:  Warm, pink and dry.   LABORATORY DATA:  White blood cell count 9.6, hematocrit 40, platelet count  240,000.  PTT 25.8,  PTT 11.5.  Electrolytes were within normal limits.  BUN  and creatinine 16 and 1.1.  EKG was normal sinus rhythm.  Cardiac  catheterization is described in HPI.   IMPRESSION:  Mr. Battaglini is a 76 year old gentleman who presents with new  onset unstable angina.  He is found to have  left main disease in a left  dominant circulation.  He has reasonably well preserved left ventricular  function.  Coronary artery bypass grafting is indicated for left main  disease.  The indications, risks, benefits and alternative procedures were  discussed in detail with the patient and his family.  They understand that  the risks of surgery include but are not limited to death, stroke, MI,  bleeding, possible need for blood transfusions, infections, DVT, PE, as well  as other __________  dysfunction including respiratory, renal, hepatic or GI  complications.  He understands these risks and agrees to proceed.  Mr.  Sacks is currently stable.  Surgery will be planned for Monday morning,  August 4 as the first case.  If he were to develop recurrent symptoms in the  interim, he may need to be done more urgently as operating room time allows.                                               Salvatore Decent Dorris Fetch, M.D.    SCH/MEDQ  D:  04/14/2002  T:  04/20/2002  Job:  [id number]   cc:   W. Ashley Royalty., M.D.   Veverly Fells. Altheimer, M.D.

## 2011-01-31 NOTE — H&P (Signed)
Ronald Frank, Ronald Frank NO.:  [id number]   MEDICAL RECORD NO.:  [id number]                    PATIENT TYPE:   LOCATION:                                       FACILITY:   PHYSICIAN:  W. Ashley Royalty., M.D.         DATE OF BIRTH:   DATE OF ADMISSION:  04/14/2002  DATE OF DISCHARGE:                                HISTORY & PHYSICAL   HISTORY:  A 76 year old male who was brought in for elective cardiac  catheterization.  The patient has a history of atypical chest pain and  dyspnea, a long-standing history of smoking, and enteric glucose tolerance.  He was evaluated in 2000 at which time he had atypical chest pain and had a  normal Adenosine Cardiolite scan.  He developed severe peripheral vascular  disease and had a right femoral to left femoral bypass graft done because of  an occluded left iliac artery.  He has continued to smoke since that time.  He presented to Dr. Altheimer's office one week ago with a two week history  of progressive chest pain.  The chest pain began on and off about two weeks  lasting around 20 minutes and was described as a left-sided aching with  radiation into the left arm.  He has become significantly short of breath  and had difficulty with the pain and it was not sharp.  It would usually  occur in the morning hours and would be described as a catch in the  shoulder.  When he was seen at the office he was placed on low dose Toprol  as well as Norvasc and given nitroglycerin.  Since that time he has had  continued chest discomfort and has taken nitroglycerin for relief on at  least one occasion.  Because of his continued chest pain, multiple risk  factors, it is recommended that he be admitted at this time for  catheterization to exclude significant coronary artery disease as the cause  for his symptoms.   PAST MEDICAL HISTORY:  Remarkable for hypertension previously, long-standing  cigarette abuse, previous atherosclerotic  peripheral vascular disease.   PAST SURGICAL HISTORY:  Lumbar laminectomy twice, polyps removed nose and  throat, cyst removed left face, right toe surgery, and tonsillectomy.   ALLERGIES:  Intolerance to ERYTHROMYCIN.   CURRENT MEDICATIONS:  1. Lorazepam 0.5 b.i.d.  2. Ambien 10 q.h.s.  3. Vioxx 25 daily.  4. Vicodin 5/500 b.i.d.  5. Pravachol 40 daily.  6. Aspirin daily.  7. Multivitamins daily.  8. Norvasc daily.  9. Toprol XL 25 daily.  10.      Zetia daily.  11.      Zyban daily.   FAMILY HISTORY:  Father died age 35 with alcohol abuse.  Mother died age 58  with alcohol abuse.  Half-brother has a brain tumor previously.  One sister  and daughter healthy.   SOCIAL HISTORY:  Part owner of [employer].  Smoked one and a half  to two packs a day for greater than 50 years.  He is a recovered alcoholic.  Has not abused it since 1983.  He has been married for many years.   REVIEW OF SYMPTOMS:  History of prostatitis and microhematuria.  He had  chronic left pain and numbness of toes.  Significant peripheral vascular  disease for which he was taken care of previously by Dr. Madilyn Fireman.  He has a  history of dyslipidemia, chronic bronchitis, history of erectile dysfunction  treated previously with Viagra which he does not take any longer,  significant anxiety and insomnia.  Other than as noted above, remainder of  the review of systems unremarkable.   PHYSICAL EXAMINATION:   GENERAL:  He is an elderly  male who smells heavily of cigarette smoke.   VITAL SIGNS:  Blood pressure 158/70 sitting, 160/70 standing, pulse 88,  weight 190.   SKIN:  Warm and dry.  Some seborrheic keratoses are noted.   HEENT:  He wears glasses.  EOMI.  PERLA.  CNS clear.  Fundi unremarkable.  Pharynx negative.   NECK:  Supple without masses.  No JVD.  No carotid bruits.   LUNGS:  Clear.   CARDIAC:  Normal S1 and S2.  There is no S3.   ABDOMEN:  Soft and nontender.  No hepatosplenomegaly or  mass.  Peripheral  pulse present.  No aneurysm.  There is soft bilateral femoral bruits noted.  Peripheral pulses are 2+ on the right and 1+ on the left.   LABORATORIES:  A 12-lead electrocardiogram was normal.   IMPRESSION:  1. Chest discomfort, some typical, other atypical features in a patient with     multiple risk factors occurring at rest in spite intensive medical     therapy.  Rule out coronary artery disease.  2. Peripheral vascular disease with previous femoral-femoral bypass.  3. Dyslipidemia.  4. Cigarette abuse, ongoing.  5. Anxiety.  6. Osteoarthritis.   RECOMMENDATIONS:  Brought in at this time for same day cardiac  catheterization.  The procedure discussed with the patient fully including  risks of myocardial infarction, objective cerebrovascular accident and he is  agreeable and willing to proceed.                                                 Darden Palmer., M.D.   WST/MEDQ  D:  04/12/2002  T:  04/13/2002  Job:  [id number]   cc:   Veverly Fells. Altheimer, M.D.

## 2011-01-31 NOTE — H&P (Signed)
Ronald Frank, Ronald Frank NO.:  [id number]   MEDICAL RECORD NO.:  [id number]                   PATIENT TYPE:   LOCATION:                                       FACILITY:   PHYSICIAN:  Georges Lynch. Gioffre, M.D.             DATE OF BIRTH:  1935/06/12   DATE OF ADMISSION:  10/02/2003  DATE OF DISCHARGE:                                HISTORY & PHYSICAL   HISTORY:  The patient has had left hip pain for the past 10 years.  He has  had increasing pain.  He has been on nonsteroidal anti-inflammatories in the  past.  They do not seem to be helping.  His symptoms were better when he was  able to take the Vioxx.  Since that has been withdrawn from the market he  has tried Celebrex but he does not get pain relief.  His increasing hip pain  has made it difficult for him to ambulate.  The patient was evaluated in the  office by Dr. Darrelyn Hillock.  Was found to have significant degenerative arthritis  left hip and the patient elects to proceed with a left total hip  arthroplasty.  The patient's primary care [place] Winf[place] is Dr. Leslie Dales,  cardiologist Dr. Donnie Aho.   ALLERGIES:  ERYTHROMYCIN and IVP DYE cause a rash.   PAST MEDICAL HISTORY:  1. Anxiety.  2. Coronary artery disease.  3. COPD.  4. Degenerative arthritis.  5. Hypertension.  6. BPH.  7. Hypercholesterolemia.   PAST SURGICAL HISTORY:  1. The patient had quadruple bypass surgery in 2003.  2. In 2001 had fem-pop bypass in his leg.  3. In 2000 he had back surgery.  4. In 1999 he had back surgery.   CURRENT MEDICATIONS:  1. Aspirin 325 mg daily.  2. Flomax 0.4 mg daily.  3. Toprol 12.5 mg daily.  4. Diovan 80 mg daily.  5. Lorazepam 0.5 mg t.i.d. p.r.n. anxiety.  6. Ambien 10 mg h.s.  7. Crestor 10 mg daily.  8. Sublingual nitroglycerin p.r.n. chest pain.  9. Chlorzoxazone b.i.d. p.r.n. leg cramps.   FAMILY HISTORY:  Not available.   REVIEW OF SYSTEMS:  GENERAL:  Denies weight change, fever, chills,  fatigue.  HEENT:  Denies headache, visual changes, tinnitus, hearing loss, sore  throat.  CARDIOVASCULAR:  Occasionally has chest pain which is relieved  generally with sublingual nitroglycerin.  He does have some shortness of  breath with exertion.  PULMONARY:  Denies dyspnea, wheezing, cough, sputum  production, hemoptysis.  GASTROINTESTINAL:  Denies dysphagia, nausea,  vomiting, hematemesis, nor abdominal pain.  GENITOURINARY:  Denies dysuria,  frequency, urgency, hematuria.  ENDOCRINE:  Denies polyuria, polydipsia,  appetite change, heat or cold intolerance.  MUSCULOSKELETAL:  The patient  has left hip pain worse with ambulation.  NEUROLOGIC:  Denies dizziness,  vertigo, syncope, seizures.  SKIN:  Denies itching, rashes, masses, or  moles.   PHYSICAL EXAMINATION:  VITAL  SIGNS:  Temperature 98.8, pulse 68,  respirations 18, blood pressure 110/70 left arm sitting.  GENERAL:  A 76 year old male in no acute distress.  HEENT:  PERRL.  EOMs intact.  Pharynx clear.  NECK:  Supple without masses.  CHEST:  Clear to auscultation bilaterally.  HEART:  Regular rate and rhythm without murmur.  ABDOMEN:  Positive bowel sounds, soft, nontender.  EXTREMITIES:  Examination of his left hip reveals leg length discrepancy.  His left leg is 1/2 inch shorter than the right.  He has painful range of  motion which is limited.  SKIN:  Warm and dry.   LABORATORIES:  X-ray of his left hip reveals degenerative arthritis of his  left hip.   IMPRESSION:  Degenerative arthritis of left hip.   PLAN:  The patient is to be admitted to [facility] October 02, 2003 to undergo a left total hip arthroplasty.     Ebbie Ridge. Paitsel, P.A.                     Ronald A. Darrelyn Hillock, M.D.    [place] Dome  D:  09/28/2003  T:  09/28/2003  Job:  [id number]

## 2011-03-31 ENCOUNTER — Encounter (INDEPENDENT_AMBULATORY_CARE_PROVIDER_SITE_OTHER): Payer: Medicare Other

## 2011-03-31 ENCOUNTER — Ambulatory Visit (INDEPENDENT_AMBULATORY_CARE_PROVIDER_SITE_OTHER): Payer: Medicare Other | Admitting: Surgery

## 2011-03-31 DIAGNOSIS — I739 Peripheral vascular disease, unspecified: Secondary | ICD-10-CM

## 2011-03-31 DIAGNOSIS — I70219 Atherosclerosis of native arteries of extremities with intermittent claudication, unspecified extremity: Secondary | ICD-10-CM

## 2011-03-31 DIAGNOSIS — Z48812 Encounter for surgical aftercare following surgery on the circulatory system: Secondary | ICD-10-CM

## 2011-04-01 NOTE — Assessment & Plan Note (Signed)
OFFICE VISIT  DHYAN, NOAH DOB:  06-08-1935                                       03/31/2011 HQION#:[id number]  The patient comes back today for followup of his claudication.  It has been over a year since I last saw him.  He has been dealing with his wife's health and has not been able to follow up.  He is a former patient of Dr. Madilyn Fireman who I saw in May of 2011 for claudication.  He has a history of a right to left femoral-femoral bypass graft in 2001 for left external iliac occlusion.  He also has a history of contrast reaction consisting of itching and eye swelling.  He did have benefit from Pletal however he could not tolerate it secondary to its side effects.  He states that currently he can walk approximately 100 feet before his legs start to hurt initially in the calf but occasionally in the thigh.  These are tolerable for him at this time.  PHYSICAL EXAMINATION:  Vital signs:  Heart rate 84, blood pressure 149/82, O2 sat 97%.  General:  He is well-appearing, in no distress. Respirations:  Nonlabored.  Abdomen:  Obese but soft.  Extremities: Warm and well-perfused.  Pedal pulses are not palpable.  DIAGNOSTIC STUDIES:  Ultrasound was performed today which shows ABIs of 0.88 on the left and 0.9 on the right.  These are essentially unchanged from a year ago.  There are elevated velocities within the distal external iliac artery on the right and proximal fem-fem bypass graft. The highest velocity is 303 within the proximal bypass.  ASSESSMENT AND PLAN:  Bilateral claudication status post femoral-femoral bypass.  I presented 2 scenarios for the patient; 1) would be to premedicate him and proceed with angiogram and possible intervention to further define his arterial anatomy, 2) the other would be to follow up in 6 months with a repeat ultrasound paying close attention to the stenosis within the native external iliac artery on the right as well  as the proximal bypass graft.  At this time the patient wishes to tolerate his symptoms and come back to see me in 6 months.    Jorge Ny, MD Electronically Signed  VWB/MEDQ  D:  03/31/2011  T:  04/01/2011  Job:  [id number]  cc:   Georga Hacking, M.D. Veverly Fells. Altheimer, M.D.

## 2011-04-07 NOTE — Procedures (Unsigned)
BYPASS GRAFT EVALUATION  INDICATION:  Followup peripheral vascular disease.  HISTORY: Diabetes:  No. Cardiac:  CABG. Hypertension:  Yes. Smoking:  Previous. Previous Surgery:  Right to left femoral to femoral bypass graft in 2003.  SINGLE LEVEL ARTERIAL EXAM                              RIGHT              LEFT Brachial:                    168                164 Anterior tibial:             146                136 Posterior tibial:            151                148 Peroneal: Ankle/brachial index:        0.90               0.88  PREVIOUS ABI:  Date:  02/04/2010  RIGHT:  0.81  LEFT:  0.85  LOWER EXTREMITY BYPASS GRAFT DUPLEX EXAM:  DUPLEX:  Elevated velocities in the distal external iliac artery on the right lower extremity in the 50%-75% stenosis range and the proximal femoral to femoral bypass graft anastomosis.  IMPRESSION: 1. Patent right to left femoral to femoral bypass graft with stenosis     as noted above. 2. Right inflow stenosis in the right distal external iliac artery as     noted above. 3. Stable ankle brachial indices bilaterally since previous study on     02/04/2010.         ___________________________________________ V. Charlena Cross, MD  SH/MEDQ  D:  03/31/2011  T:  03/31/2011  Job:  [id number]

## 2011-08-25 ENCOUNTER — Encounter: Payer: Self-pay | Admitting: Surgery

## 2011-09-26 ENCOUNTER — Encounter: Payer: Self-pay | Admitting: Surgery

## 2011-09-29 ENCOUNTER — Ambulatory Visit (INDEPENDENT_AMBULATORY_CARE_PROVIDER_SITE_OTHER): Payer: Medicare Other | Admitting: Surgery

## 2011-09-29 ENCOUNTER — Ambulatory Visit: Payer: Medicare Other | Admitting: Surgery

## 2011-09-29 ENCOUNTER — Other Ambulatory Visit: Payer: Medicare Other

## 2011-09-29 ENCOUNTER — Ambulatory Visit (INDEPENDENT_AMBULATORY_CARE_PROVIDER_SITE_OTHER): Payer: Medicare Other | Admitting: *Deleted

## 2011-09-29 ENCOUNTER — Other Ambulatory Visit (INDEPENDENT_AMBULATORY_CARE_PROVIDER_SITE_OTHER): Payer: Medicare Other | Admitting: *Deleted

## 2011-09-29 ENCOUNTER — Encounter: Payer: Self-pay | Admitting: Surgery

## 2011-09-29 VITALS — BP 152/82 | HR 90 | Resp 16 | Ht 70.0 in | Wt 226.0 lb

## 2011-09-29 DIAGNOSIS — I739 Peripheral vascular disease, unspecified: Secondary | ICD-10-CM

## 2011-09-29 DIAGNOSIS — Z48812 Encounter for surgical aftercare following surgery on the circulatory system: Secondary | ICD-10-CM

## 2011-09-29 NOTE — Progress Notes (Signed)
[facility] [place]   Patient name: Ronald Frank MRN: [id number] DOB: 08/03/1935 Sex: male     Chief Complaint  Patient presents with  . PVD    6 month f/up -  bilateral calf's  hurts    HISTORY OF PRESENT ILLNESS: The patient is back today for continued followup is bilateral claudication. He is status post right to left femoral-femoral bypass graft in 2001 by Dr. Madilyn Fireman for a left external iliac occlusion. He does have a history of a contrast reaction consisting of itching and I swelling. He has subsequently had contrast with premedication and no symptoms. A year ago he can walk approximately 100 feet before his legs began to her and clamp now let us down to approximately 50-60 feet. We have discussed intervening and his proximal fem-fem bypass graft stenosis a year ago however due to his wife's health he had elected not to get this done. He is back today for further followup. He does not have any active ulceration at this time.  Past Medical History  Diagnosis Date  . COPD (chronic obstructive pulmonary disease)   . Hyperlipidemia   . Myocardial infarction 2003  . Arthritis   . Joint pain   . Bronchitis   . Productive cough   . Anxiety     Past Surgical History  Procedure Date  . Total hip arthroplasty 2005  . Coronary artery bypass graft 2003  . Femoral artery - femoral artery bypass graft 2003    right to left  . Lumbar disc surgery     x2 1999 & 2000    History   Social History  . Marital Status: Married    Spouse Name: N/A    Number of Children: N/A  . Years of Education: N/A   Occupational History  . Not on file.   Social History Main Topics  . Smoking status: Former Smoker    Types: Cigarettes    Quit date: 09/15/2001  . Smokeless tobacco: Not on file  . Alcohol Use: No  . Drug Use:   . Sexually Active:    Other Topics Concern  . Not on file   Social History Narrative  . No narrative on file    History reviewed. No  pertinent family history.  Allergies as of 09/29/2011 - Review Complete 09/29/2011  Allergen Reaction Noted  . Actos (pioglitazone hydrochloride)  08/25/2011  . Erythromycin  08/25/2011  . Lexapro  08/25/2011  . Lipitor (atorvastatin calcium)  08/25/2011  . Omnipaque (iohexol)  08/25/2011  . Pletaal (cilostazol)  08/25/2011    Current Outpatient Prescriptions on File Prior to Visit  Medication Sig Dispense Refill  . ALBUTEROL IN Inhale into the lungs.        Marland Kitchen aspirin EC 81 MG tablet Take 81 mg by mouth daily.        . Cinnamon 500 MG capsule Take 500 mg by mouth daily.        Marland Kitchen ezetimibe (ZETIA) 10 MG tablet Take 10 mg by mouth daily.        Marland Kitchen Fexofenadine HCl (ALLEGRA PO) Take by mouth 2 (two) times daily.        Marland Kitchen HYDROcodone-acetaminophen (VICODIN) 5-500 MG per tablet Take 1 tablet by mouth every 6 (six) hours as needed.        Marland Kitchen LORazepam (ATIVAN) 0.5 MG tablet Take 1-1.5 mg by mouth daily.        Marland Kitchen losartan (COZAAR) 50 MG tablet Take 50 mg  by mouth 2 (two) times daily.        . metFORMIN (GLUCOPHAGE) 500 MG tablet Take 500 mg by mouth 2 (two) times daily with a meal.        . metoprolol succinate (TOPROL-XL) 12.5 mg TB24 Take 12.5 mg by mouth daily.        Marland Kitchen omeprazole (PRILOSEC) 20 MG capsule Take 20 mg by mouth daily.        . rosuvastatin (CRESTOR) 40 MG tablet Take 40 mg by mouth daily.        . Tamsulosin HCl (FLOMAX) 0.4 MG CAPS Take 0.4 mg by mouth daily.        Marland Kitchen tiotropium (SPIRIVA) 18 MCG inhalation capsule Place 18 mcg into inhaler and inhale daily.        Marland Kitchen venlafaxine (EFFEXOR-XR) 75 MG 24 hr capsule Take 75 mg by mouth 2 (two) times daily.        . vitamin C (ASCORBIC ACID) 500 MG tablet Take 500 mg by mouth daily.        . Vitamin D, Ergocalciferol, (DRISDOL) 50000 UNITS CAPS Take 50,000 Units by mouth every 7 (seven) days.        Marland Kitchen zolpidem (AMBIEN) 10 MG tablet Take 10 mg by mouth at bedtime as needed.           REVIEW OF SYSTEMS: Positive for chest pain,  shortness of breath when lying flat shortness of breath with exertion pain in his legs with walking weakness in his arms and legs numbness in his arms and legs and a skin rash. All other review of systems are negative.  PHYSICAL EXAMINATION:   Vital signs are BP 152/82  Pulse 90  Resp 16  Ht 5\' 10"  (1.778 m)  Wt 226 lb (102.513 kg)  BMI 32.43 kg/m2  SpO2 90% General: The patient appears their stated age. HEENT:  No gross abnormalities Pulmonary:  Non labored breathing Abdomen: Soft and non-tender, obese Musculoskeletal: There are no major deformities. Neurologic: No focal weakness or paresthesias are detected, Skin: There are no ulcer or rashes noted. Psychiatric: The patient has normal affect. Cardiovascular: There is a regular rate and rhythm without significant murmur appreciated. Nonpalpable pedal pulse   Diagnostic Studies Duplex ultrasound was performed today and interpreted. This shows an elevation in the proximal portion of the femoral-femoral bypass graft on the right groin. Peak velocity of 403 cm/s. This is increased from 300 one year ago. There are elevated velocities in the external iliac artery on the right measuring 209 cm/s. There is a mid graft stenosis with velocities of 173 cm/s  Assessment: Bilateral claudication with history of right to left femoral-femoral bypass graft, now with bypass graft stenosis. Plan: The patient's bypass graft stenosis is in the origin of the right groin anastomosis. I don't isolate make this describes or explains his bilateral symptoms however I do feel that he needs to have a formal arteriogram to have this formally evaluated. I believe the best way to start [place] to access the femoral-femoral bypass graft in the left groin and to try to get a catheter into the aorta from the air I can then perform bilateral runoff to get an evaluation both legs and potentially intervene in the right groin. The patient will contact me to get this scheduled as he  is to arrange transportation with his daughter. He will be premedicated porous contrast allergy prior to the procedure  V. Charlena Cross, M.D. [facility]  Office: [phone] Pager:  [phone]

## 2011-10-06 NOTE — Procedures (Unsigned)
BYPASS GRAFT EVALUATION  INDICATION:  Followup right to left fem-fem graft  HISTORY: Diabetes:  No Cardiac: Hypertension:  Yes Smoking:  Previous Previous Surgery:  Fem-fem graft 2001  SINGLE LEVEL ARTERIAL EXAM                              RIGHT              LEFT Brachial: Anterior tibial: Posterior tibial: Peroneal: Ankle/brachial index:        0.91               0.87  PREVIOUS ABI:  Date:  03/31/2011  RIGHT:  0.88  LEFT:  0.90  LOWER EXTREMITY BYPASS GRAFT DUPLEX EXAM:  DUPLEX:  Elevated velocities are observed at the proximal anastomosis of the right to left fem-fem graft.  Mild to moderate diffuse disease is observed in the right external iliac native inflow artery.  However, 2-D image of plaque amount does not support the stenosis category.  Angle of anastomosis with graft is suspected for some degree of elevated velocities, however, cannot rule out severe stenosis.  IMPRESSION: 1. Patent right to left femoral-femoral graft with significantly     elevated velocities at the proximal anastomosis as described above. 2. Please see attached diagram for details.  ___________________________________________ V. Charlena Cross, MD  LT/MEDQ  D:  09/30/2011  T:  09/30/2011  Job:  [id number]

## 2011-10-17 ENCOUNTER — Other Ambulatory Visit: Payer: Self-pay | Admitting: Allergy

## 2011-10-17 MED ORDER — ALBUTEROL SULFATE HFA 108 (90 BASE) MCG/ACT IN AERS: 2.0000 | INHALATION_SPRAY | Freq: Four times a day (QID) | RESPIRATORY_TRACT | Status: DC | PRN

## 2011-10-17 NOTE — Telephone Encounter (Signed)
[facility]  VENTOLIN   2 PUFFS 4 TIMES DAY AS NEEDED PT NEEDS AN APPT BEFORE REFILL RUNS OUT.

## 2011-10-23 ENCOUNTER — Other Ambulatory Visit: Payer: Self-pay | Admitting: Allergy

## 2011-10-23 MED ORDER — ALBUTEROL SULFATE HFA 108 (90 BASE) MCG/ACT IN AERS: 2.0000 | INHALATION_SPRAY | Freq: Four times a day (QID) | RESPIRATORY_TRACT | Status: DC | PRN

## 2011-11-28 ENCOUNTER — Other Ambulatory Visit: Payer: Self-pay

## 2011-11-28 DIAGNOSIS — Z91041 Radiographic dye allergy status: Secondary | ICD-10-CM

## 2011-11-28 MED ORDER — PREDNISONE 50 MG PO TABS: ORAL_TABLET | ORAL | Status: AC

## 2011-11-28 MED ORDER — DIPHENHYDRAMINE HCL 50 MG PO CAPS: ORAL_CAPSULE | ORAL | Status: DC

## 2011-12-17 ENCOUNTER — Encounter (HOSPITAL_COMMUNITY): Payer: Self-pay | Admitting: Pharmacy Technician

## 2011-12-29 MED ORDER — SODIUM CHLORIDE 0.9 % IV SOLN
INTRAVENOUS | Status: DC
  Administered 2011-12-30: 08:00:00 via INTRAVENOUS

## 2011-12-30 ENCOUNTER — Ambulatory Visit (HOSPITAL_COMMUNITY)
Admission: RE | Admit: 2011-12-30 | Discharge: 2011-12-30 | Disposition: A | Discharge status: Home / Self Care | Payer: Medicare Other | Source: Ambulatory Visit | Attending: Surgery | Admitting: Surgery

## 2011-12-30 ENCOUNTER — Encounter (HOSPITAL_COMMUNITY)
Admission: RE | Disposition: A | Discharge status: Home / Self Care | Payer: Self-pay | Source: Ambulatory Visit | Attending: Surgery

## 2011-12-30 DIAGNOSIS — I70219 Atherosclerosis of native arteries of extremities with intermittent claudication, unspecified extremity: Secondary | ICD-10-CM | POA: Insufficient documentation

## 2011-12-30 DIAGNOSIS — E785 Hyperlipidemia, unspecified: Secondary | ICD-10-CM | POA: Insufficient documentation

## 2011-12-30 DIAGNOSIS — Z96649 Presence of unspecified artificial hip joint: Secondary | ICD-10-CM | POA: Insufficient documentation

## 2011-12-30 DIAGNOSIS — J4489 Other specified chronic obstructive pulmonary disease: Secondary | ICD-10-CM | POA: Insufficient documentation

## 2011-12-30 DIAGNOSIS — J449 Chronic obstructive pulmonary disease, unspecified: Secondary | ICD-10-CM | POA: Insufficient documentation

## 2011-12-30 HISTORY — PX: ABDOMINAL AORTAGRAM: SHX5454

## 2011-12-30 HISTORY — PX: PERCUTANEOUS STENT INTERVENTION: SHX5500

## 2011-12-30 LAB — POCT I-STAT, CHEM 8
BUN: 22 mg/dL (ref 6–23)
Chloride: 108 mEq/L (ref 96–112)
Glucose, Bld: 240 mg/dL — ABNORMAL HIGH (ref 70–99)
HCT: 44 % (ref 39.0–52.0)
Potassium: 5.3 mEq/L — ABNORMAL HIGH (ref 3.5–5.1)

## 2011-12-30 LAB — GLUCOSE, CAPILLARY: Glucose-Capillary: 155 mg/dL — ABNORMAL HIGH (ref 70–99)

## 2011-12-30 LAB — POCT ACTIVATED CLOTTING TIME
Activated Clotting Time: 221 seconds
Activated Clotting Time: 166 seconds

## 2011-12-30 SURGERY — ABDOMINAL AORTAGRAM
Anesthesia: LOCAL | Laterality: Right

## 2011-12-30 MED ORDER — ONDANSETRON HCL 4 MG/2ML IJ SOLN: 4.0000 mg | Freq: Four times a day (QID) | INTRAMUSCULAR | Status: DC | PRN

## 2011-12-30 MED ORDER — ACETAMINOPHEN 325 MG RE SUPP: 325.0000 mg | RECTAL | Status: DC | PRN

## 2011-12-30 MED ORDER — METOPROLOL TARTRATE 1 MG/ML IV SOLN: 2.0000 mg | INTRAVENOUS | Status: DC | PRN

## 2011-12-30 MED ORDER — MORPHINE SULFATE 10 MG/ML IJ SOLN: 2.0000 mg | INTRAMUSCULAR | Status: DC | PRN

## 2011-12-30 MED ORDER — HEPARIN SODIUM (PORCINE) 1000 UNIT/ML IJ SOLN
INTRAMUSCULAR | Status: AC
  Filled 2011-12-30: qty 1

## 2011-12-30 MED ORDER — OXYCODONE HCL 5 MG PO TABS: 5.0000 mg | ORAL_TABLET | ORAL | Status: DC | PRN

## 2011-12-30 MED ORDER — CLONIDINE HCL 0.2 MG PO TABS: 0.2000 mg | ORAL_TABLET | ORAL | Status: DC | PRN

## 2011-12-30 MED ORDER — FENTANYL CITRATE 0.05 MG/ML IJ SOLN
INTRAMUSCULAR | Status: AC
  Filled 2011-12-30: qty 2

## 2011-12-30 MED ORDER — LIDOCAINE HCL (PF) 1 % IJ SOLN
INTRAMUSCULAR | Status: AC
  Filled 2011-12-30: qty 30

## 2011-12-30 MED ORDER — HEPARIN (PORCINE) IN NACL 2-0.9 UNIT/ML-% IJ SOLN
INTRAMUSCULAR | Status: AC
  Filled 2011-12-30: qty 1000

## 2011-12-30 MED ORDER — MIDAZOLAM HCL 2 MG/2ML IJ SOLN
INTRAMUSCULAR | Status: AC
  Filled 2011-12-30: qty 2

## 2011-12-30 MED ORDER — CLOPIDOGREL BISULFATE 75 MG PO TABS: 75.0000 mg | ORAL_TABLET | Freq: Every day | ORAL | Status: AC

## 2011-12-30 MED ORDER — ACETAMINOPHEN 325 MG PO TABS: 325.0000 mg | ORAL_TABLET | ORAL | Status: DC | PRN

## 2011-12-30 MED ORDER — SODIUM CHLORIDE 0.9 % IV SOLN: 1.0000 mL/kg/h | INTRAVENOUS | Status: DC

## 2011-12-30 NOTE — H&P (Signed)
Jorge Ny, MD 09/29/2011 6:00 PM Signed  [facility] [place]  Patient name: Ronald Frank MRN: [id number] DOB: 1934-11-11 Sex: male  Chief Complaint   Patient presents with   .  PVD     6 month f/up - bilateral calf's hurts    HISTORY OF PRESENT ILLNESS:  The patient is back today for continued followup is bilateral claudication. He is status post right to left femoral-femoral bypass graft in 2001 by Dr. Madilyn Fireman for a left external iliac occlusion. He does have a history of a contrast reaction consisting of itching and I swelling. He has subsequently had contrast with premedication and no symptoms. A year ago he could walk approximately 100 feet before his legs began to her and clamp now let us down to approximately 50-60 feet. We have discussed intervening and his proximal fem-fem bypass graft stenosis a year ago however due to his wife's health he had elected not to get this done. He is back today for further followup. He does not have any active ulceration at this time.  Past Medical History   Diagnosis  Date   .  COPD (chronic obstructive pulmonary disease)    .  Hyperlipidemia    .  Myocardial infarction  2003   .  Arthritis    .  Joint pain    .  Bronchitis    .  Productive cough    .  Anxiety     Past Surgical History   Procedure  Date   .  Total hip arthroplasty  2005   .  Coronary artery bypass graft  2003   .  Femoral artery - femoral artery bypass graft  2003     right to left   .  Lumbar disc surgery      x2 1999 & 2000    History    Social History   .  Marital Status:  Married     Spouse Name:  N/A     Number of Children:  N/A   .  Years of Education:  N/A    Occupational History   .  Not on file.    Social History Main Topics   .  Smoking status:  Former Smoker     Types:  Cigarettes     Quit date:  09/15/2001   .  Smokeless tobacco:  Not on file   .  Alcohol Use:  No   .  Drug Use:    .  Sexually Active:     Other Topics   Concern   .  Not on file    Social History Narrative   .  No narrative on file    History reviewed. No pertinent family history.  Allergies as of 09/29/2011 - Review Complete 09/29/2011   Allergen  Reaction  Noted   .  Actos (pioglitazone hydrochloride)   08/25/2011   .  Erythromycin   08/25/2011   .  Lexapro   08/25/2011   .  Lipitor (atorvastatin calcium)   08/25/2011   .  Omnipaque (iohexol)   08/25/2011   .  Pletaal (cilostazol)   08/25/2011    Current Outpatient Prescriptions on File Prior to Visit   Medication  Sig  Dispense  Refill   .  ALBUTEROL IN  Inhale into the lungs.     Marland Kitchen  aspirin EC 81 MG tablet  Take 81 mg by mouth daily.     Hassan Buckler  500 MG capsule  Take 500 mg by mouth daily.     Marland Kitchen  ezetimibe (ZETIA) 10 MG tablet  Take 10 mg by mouth daily.     Marland Kitchen  Fexofenadine HCl (ALLEGRA PO)  Take by mouth 2 (two) times daily.     Marland Kitchen  HYDROcodone-acetaminophen (VICODIN) 5-500 MG per tablet  Take 1 tablet by mouth every 6 (six) hours as needed.     Marland Kitchen  LORazepam (ATIVAN) 0.5 MG tablet  Take 1-1.5 mg by mouth daily.     Marland Kitchen  losartan (COZAAR) 50 MG tablet  Take 50 mg by mouth 2 (two) times daily.     .  metFORMIN (GLUCOPHAGE) 500 MG tablet  Take 500 mg by mouth 2 (two) times daily with a meal.     .  metoprolol succinate (TOPROL-XL) 12.5 mg TB24  Take 12.5 mg by mouth daily.     Marland Kitchen  omeprazole (PRILOSEC) 20 MG capsule  Take 20 mg by mouth daily.     .  rosuvastatin (CRESTOR) 40 MG tablet  Take 40 mg by mouth daily.     .  Tamsulosin HCl (FLOMAX) 0.4 MG CAPS  Take 0.4 mg by mouth daily.     Marland Kitchen  tiotropium (SPIRIVA) 18 MCG inhalation capsule  Place 18 mcg into inhaler and inhale daily.     Marland Kitchen  venlafaxine (EFFEXOR-XR) 75 MG 24 hr capsule  Take 75 mg by mouth 2 (two) times daily.     .  vitamin C (ASCORBIC ACID) 500 MG tablet  Take 500 mg by mouth daily.     .  Vitamin D, Ergocalciferol, (DRISDOL) 50000 UNITS CAPS  Take 50,000 Units by mouth every 7 (seven) days.     Marland Kitchen  zolpidem (AMBIEN)  10 MG tablet  Take 10 mg by mouth at bedtime as needed.      REVIEW OF SYSTEMS:  Positive for chest pain, shortness of breath when lying flat shortness of breath with exertion pain in his legs with walking weakness in his arms and legs numbness in his arms and legs and a skin rash. All other review of systems are negative.  PHYSICAL EXAMINATION:  Vital signs are BP 152/82  Pulse 90  Resp 16  Ht 5\' 10"  (1.778 m)  Wt 226 lb (102.513 kg)  BMI 32.43 kg/m2  SpO2 90%  General: The patient appears their stated age.  HEENT: No gross abnormalities  Pulmonary: Non labored breathing  Abdomen: Soft and non-tender, obese  Musculoskeletal: There are no major deformities.  Neurologic: No focal weakness or paresthesias are detected,  Skin: There are no ulcer or rashes noted.  Psychiatric: The patient has normal affect.  Cardiovascular: There is a regular rate and rhythm without significant murmur appreciated. Nonpalpable pedal pulse  Diagnostic Studies  Duplex ultrasound was performed today and interpreted. This shows an elevation in the proximal portion of the femoral-femoral bypass graft on the right groin. Peak velocity of 403 cm/s. This is increased from 300 one year ago. There are elevated velocities in the external iliac artery on the right measuring 209 cm/s. There is a mid graft stenosis with velocities of 173 cm/s  Assessment:  Bilateral claudication with history of right to left femoral-femoral bypass graft, now with bypass graft stenosis.  Plan:  The patient's bypass graft stenosis is in the origin of the right groin anastomosis. I don't think this explains his bilateral symptoms however I do feel that he needs to have a formal arteriogram to have  this formally evaluated. I believe the best way to start is to access the femoral-femoral bypass graft in the left groin and to try to get a catheter into the aorta from there I can then perform bilateral runoff to get an evaluation both legs and  potentially intervene in the right groin. The patient will contact me to get this scheduled as he is to arrange transportation with his daughter. He will be premedicated for his contrast allergy prior to the procedure    V. Charlena Cross, M.D.  [facility] [place]  Office: [phone]  Pager: [phone]

## 2011-12-30 NOTE — Progress Notes (Signed)
Left groin dressing changed and placed bandaid to site.  D/c instructions reviewed with pt and pt family.  Prescription given to son. Orthostatic vs done and pt tolerated well.  Pt ambulated in hall and tolerated well.  Pt D/C home with son via wheelchair.

## 2011-12-30 NOTE — Op Note (Signed)
[facility] [place]  Patient name: Ronald Frank MRN: [id number] DOB: 12-23-1934 Sex: male  12/30/2011 Pre-operative Diagnosis: bilateral claudication Post-operative diagnosis:  Same Surgeon:  Jorge Ny Procedure Performed:  1.  Ultra sound access  2.  Aortogram with bilateral lower extremity  3.  stent right superficial femoral and popliteal artery    Indications:  The patient has a history of a right to left femoral-femoral bypass graft. He has severe lifestyle limiting claudication. Ultrasound has suggested there may be a stenosis in the right femoral anastomosis. He comes in today for evaluation and possible intervention  Procedure:  The patient was identified in the holding area and taken to room 8.  The patient was then placed supine on the table and prepped and draped in the usual sterile fashion.  A time out was called.  Ultrasound was used to evaluate the femoral-femoral bypass graft.  It was patent .  A digital ultrasound image was acquired.  A micropuncture needle was used to access the graft on the left side under ultrasound guidance.  An 018 wire was advanced without resistance and a micropuncture sheath was placed.  The 018 wire was removed and a Amplatz wire was placed.  The micropuncture sheath was exchanged for a 5 french sheath.  An omniflush catheter was used to navigate it into the aorta. The catheter was then advanced to the level of L-1.  An abdominal angiogram was obtained.  The catheter was then brought down to the aortic bifurcation and bilateral lower extremity runoff was performed  Findings:   Aortogram:  The visualized portions of the suprarenal abdominal aorta showed no significant disease. There are single renal arteries which are widely patent. The infrarenal abdominal aorta is patent throughout it's course. There is occlusion of the left iliac arterial system. I did not see any stenosis within the right common or external iliac  artery. There is a patent right to left femoral-femoral bypass graft without evidence of stenosis at the groin anastomoses.  Right Lower Extremity:  The right femoral anastomosis is widely patent. The right profunda femoris artery is widely patent. There is diffuse disease throughout the proximal superficial femoral artery with a total occlusion distal superficial femoral artery and reconstitution of the above-knee popliteal artery. The dominant runoff vessel is the posterior tibial artery, however all 3 vessels are patent to the ankle  Left Lower Extremity:  The left limb of the femoral-femoral bypass graft is widely patent. The anastomosis is to the superficial femoral artery. The superficial femoral artery is patent throughout its course however in the adductor canal there is a high grade, greater than 80% stenosis. The remaining portion the popliteal artery is widely patent is three-vessel runoff.  Intervention:  After the above images were obtained the decision was made to intervene. Over an Amplatz wire a 6 [place] 1 sheath was advanced into the right superficial femoral artery. The patient was fully heparinized. I used a Glidewire and a quick cross catheter to cross the occlusion in the superficial femoral artery. Once the occlusion was crossed a Margarito Schein wire was placed. I elected to primarily stent in this area. 3 overlapping 7 x 100 Abbott  absolute pro stents were placed. They were then dilated using a 6 mm balloon. A completion arteriogram was then performed there did appear to be a more prominent lesion distal to the stent which I felt needed to be addressed. I extended the stents with a 7 x 80  and molded it with a 6 mm balloon. A completion arteriogram revealed widely patent flow through the superficial femoral artery with runoff that was unchanged.  At this point the decision was made to terminate the procedure. Catheters and wires were removed. The patient was taken to the holding area  for sheath pull.  Impression:  #1  widely patent right to left femoral-femoral bypass graft with no evidence of anastomotic stenosis. The in flow to the bypass graft within the right iliac system is widely patent.  #2  successful stenting of a right superficial femoral and popliteal artery occlusion using 7 mm self-expanding stents  #3  high-grade left superficial femoral artery stenosis within the adductor canal    V. Durene Cal, M.D. [facility] [place] Office: [phone] Pager:  [phone]

## 2011-12-30 NOTE — Discharge Instructions (Signed)

## 2012-01-05 ENCOUNTER — Other Ambulatory Visit: Payer: Self-pay | Admitting: *Deleted

## 2012-01-05 ENCOUNTER — Encounter: Payer: Self-pay | Admitting: *Deleted

## 2012-01-05 ENCOUNTER — Encounter (HOSPITAL_COMMUNITY): Payer: Self-pay | Admitting: Pharmacy Technician

## 2012-01-05 DIAGNOSIS — Z91041 Radiographic dye allergy status: Secondary | ICD-10-CM

## 2012-01-05 MED ORDER — PREDNISONE 50 MG PO TABS: ORAL_TABLET | ORAL | Status: DC

## 2012-01-19 MED ORDER — SODIUM CHLORIDE 0.9 % IV SOLN
INTRAVENOUS | Status: DC
  Administered 2012-01-20: 1000 mL via INTRAVENOUS

## 2012-01-20 ENCOUNTER — Telehealth: Payer: Self-pay | Admitting: Vascular Surgery

## 2012-01-20 ENCOUNTER — Encounter (HOSPITAL_COMMUNITY)
Admission: RE | Disposition: A | Discharge status: Home / Self Care | Payer: Self-pay | Source: Ambulatory Visit | Attending: Surgery

## 2012-01-20 ENCOUNTER — Telehealth: Payer: Self-pay | Admitting: Surgery

## 2012-01-20 ENCOUNTER — Ambulatory Visit (HOSPITAL_COMMUNITY)
Admission: RE | Admit: 2012-01-20 | Discharge: 2012-01-20 | Disposition: A | Discharge status: Home / Self Care | Payer: Medicare Other | Source: Ambulatory Visit | Attending: Surgery | Admitting: Surgery

## 2012-01-20 ENCOUNTER — Other Ambulatory Visit: Payer: Self-pay | Admitting: *Deleted

## 2012-01-20 DIAGNOSIS — I70219 Atherosclerosis of native arteries of extremities with intermittent claudication, unspecified extremity: Secondary | ICD-10-CM

## 2012-01-20 DIAGNOSIS — T82898A Other specified complication of vascular prosthetic devices, implants and grafts, initial encounter: Secondary | ICD-10-CM | POA: Insufficient documentation

## 2012-01-20 DIAGNOSIS — E785 Hyperlipidemia, unspecified: Secondary | ICD-10-CM | POA: Insufficient documentation

## 2012-01-20 DIAGNOSIS — M129 Arthropathy, unspecified: Secondary | ICD-10-CM | POA: Insufficient documentation

## 2012-01-20 DIAGNOSIS — Y831 Surgical operation with implant of artificial internal device as the cause of abnormal reaction of the patient, or of later complication, without mention of misadventure at the time of the procedure: Secondary | ICD-10-CM | POA: Insufficient documentation

## 2012-01-20 DIAGNOSIS — Z96649 Presence of unspecified artificial hip joint: Secondary | ICD-10-CM | POA: Insufficient documentation

## 2012-01-20 DIAGNOSIS — J449 Chronic obstructive pulmonary disease, unspecified: Secondary | ICD-10-CM | POA: Insufficient documentation

## 2012-01-20 DIAGNOSIS — F411 Generalized anxiety disorder: Secondary | ICD-10-CM | POA: Insufficient documentation

## 2012-01-20 DIAGNOSIS — J4489 Other specified chronic obstructive pulmonary disease: Secondary | ICD-10-CM | POA: Insufficient documentation

## 2012-01-20 HISTORY — PX: OTHER SURGICAL HISTORY: SHX169

## 2012-01-20 HISTORY — PX: ABDOMINAL AORTAGRAM: SHX5454

## 2012-01-20 LAB — POCT I-STAT, CHEM 8
Calcium, Ion: 1.14 mmol/L (ref 1.12–1.32)
Chloride: 106 mEq/L (ref 96–112)
Creatinine, Ser: 1.3 mg/dL (ref 0.50–1.35)
Glucose, Bld: 167 mg/dL — ABNORMAL HIGH (ref 70–99)
HCT: 41 % (ref 39.0–52.0)
Hemoglobin: 13.9 g/dL (ref 13.0–17.0)

## 2012-01-20 LAB — GLUCOSE, CAPILLARY: Glucose-Capillary: 143 mg/dL — ABNORMAL HIGH (ref 70–99)

## 2012-01-20 SURGERY — ABDOMINAL AORTAGRAM
Anesthesia: LOCAL | Laterality: Right

## 2012-01-20 MED ORDER — HEPARIN SODIUM (PORCINE) 1000 UNIT/ML IJ SOLN
INTRAMUSCULAR | Status: AC
  Filled 2012-01-20: qty 1

## 2012-01-20 MED ORDER — MIDAZOLAM HCL 2 MG/2ML IJ SOLN
INTRAMUSCULAR | Status: AC
  Filled 2012-01-20: qty 2

## 2012-01-20 MED ORDER — HEPARIN (PORCINE) IN NACL 2-0.9 UNIT/ML-% IJ SOLN
INTRAMUSCULAR | Status: AC
  Filled 2012-01-20: qty 1000

## 2012-01-20 MED ORDER — FENTANYL CITRATE 0.05 MG/ML IJ SOLN
INTRAMUSCULAR | Status: AC
  Filled 2012-01-20: qty 2

## 2012-01-20 MED ORDER — LIDOCAINE HCL (PF) 1 % IJ SOLN
INTRAMUSCULAR | Status: AC
  Filled 2012-01-20: qty 30

## 2012-01-20 NOTE — Op Note (Addendum)
[facility] [place]  Patient name: Ronald Frank MRN: [id number] DOB: 1935/01/18 Sex: male  01/20/2012 Pre-operative Diagnosis: Right bypass graft stenosis Post-operative diagnosis:  Same Surgeon:  Jorge Ny Procedure Performed:  1.  ultrasound access left femoral artery  2.  abdominal aortogram  3.  bilateral lower extremity runoff  4.  second order catheterization  5.  angioplasty right femoral artery  6.  followup x1    Indications:  The patient has a history of right to left femoral-femoral bypass graft and right superficial stenting. Ultrasound detected an elevation in the velocities at the right femoral anastomosis. The patient has had some recurrence of his symptoms he comes in today for further evaluation and possible intervention  Procedure:  The patient was identified in the holding area and taken to room 8.  The patient was then placed supine on the table and prepped and draped in the usual sterile fashion.  A time out was called.  Ultrasound was used to evaluate the left common femoral artery.  It was patent .  A digital ultrasound image was acquired.  A micropuncture needle was used to access the femoral-femoral bypass graft within the left groin. Micropuncture wire was advanced N/A sheath followed. I then inserted an Amplatz wire and a 5 French sheath was placed. Using a crossover catheter and a Benson wire access was obtained into the abdominal aorta. An omni-flush catheter was advanced to the level of L1 and an abdominal aortogram was performed. Next the catheter was pulled down to the aortic bifurcation and a pelvic angiogram was performed. I then pulled the Omni flush catheter down into the external iliac artery on the right and bilateral lower extremity runoff was performed. Findings:   Aortogram:  The visualized portions of the suprarenal abdominal aorta showed no significant disease. The right renal artery is widely patent. I do not see the  left renal artery. The infrarenal abdominal aorta is widely patent. The left common iliac artery is occluded. No stenosis is identified within the right common or external iliac artery.  Right Lower Extremity:  The right common femoral artery is widely patent. There is a stenosis within the proximal portion of the bypass graft. The right profunda femoral artery is widely patent. The stents within the right superficial femoral artery are widely patent. There are three-vessel runoff however proximal disease and slow passage of the contrast limits. Visualization.  Left Lower Extremity:  The left common femoral artery is patent throughout it's course left profunda and superficial femoral artery are widely patent. Within the adductor canal, the stenosis was approximately 30-40%.  The popliteal artery is patent throughout it's course. There is three-vessel runoff however these are not well visualized to 2 progression of contrast. The femoral-femoral bypass graft is patent however multiple areas of irregularity are identified.  Intervention:  Over a Amplatz superstiff wire a 6 French sheath was placed. The patient was fully heparinized. I advanced an 014 wire into the aorta and a 5 x 40 angiosculpt was used to perform balloon angioplasty within the right common femoral artery and the proximal anastomosis to the femoral-femoral bypass graft. The balloon was taken to 10 atmospheres for 1 minute. I then perform balloon angioplasty within the femoral-femoral bypass graft taking the balloon to 18 atmospheres. A followup study showed suboptimal result and therefore I elected to up size the balloon. Over a 014 wire a 7 x 40 [employer] balloon was used. This is taken to 15  atmospheres within the right femoral anastomosis and throughout the bypass graft. Followup angiography revealed improved results within the right groin as well as within the bypass graft. There was approximately a 10 mm gradient between the  aorta and the left groin. I felt this is most likely due to 2 the femoral-femoral bypass graft and therefore did not intervene further. The catheters and wires were removed and the patient will be taken to the holding area for sheath pull once his heparin is reversed.  Impression:  #1  successful angioplasty of the stenosis within the proximal anastomosis of the right to left femoral-femoral bypass                        graft within the right groin. This was done with a 7 x 40 balloon.  #2  widely patent right superficial femoral artery stents.  #3  widely patent inflow to the right to left femoral-femoral bypass graft  #4  three-vessel runoff bilaterally  #5  The left SFA lesion did not look as significant today    V. Durene Cal, M.D. [facility] [place] Office: [phone] Pager:  [phone]

## 2012-01-20 NOTE — Telephone Encounter (Signed)
Message copied by Rosalyn Charters on Tue Jan 20, 2012  2:45 PM ------      Message from: Melene Plan      Created: Tue Jan 20, 2012 12:24 PM       IMAGINE I HAVE BEEN INTERRUPTED & HAVE NOOOO IDEA IF THIS WAS SENT ALREADY      ----- Message -----         From: Nada Libman, MD         Sent: 01/20/2012  11:49 AM           To: Almetta Lovely, RN            01/20/2012, the patient had the following procedures:             1.  ultrasound access left femoral artery       2.  abdominal aortogram       3.  bilateral lower extremity runoff       4.  second order catheterization       5.  angioplasty right femoral artery       6.  followup x1                  Please schedule the patient to come back in 3 months for followup with a duplex ultrasound of his bilateral lower extremity and ABI

## 2012-01-20 NOTE — Discharge Instructions (Signed)

## 2012-01-20 NOTE — Interval H&P Note (Signed)
History and Physical Interval Note:  01/20/2012 9:49 AM  Ronald Frank  has presented today for surgery, with the diagnosis of PVD  The various methods of treatment have been discussed with the patient and family. After consideration of risks, benefits and other options for treatment, the patient has consented to  Procedure(s) (LRB): ABDOMINAL AORTAGRAM (N/A) as a surgical intervention .  The patients' history has been reviewed, patient examined, no change in status, stable for surgery.  I have reviewed the patients' chart and labs.  Questions were answered to the patient's satisfaction.     Lindzee Gouge IV, V. WELLS

## 2012-01-20 NOTE — Telephone Encounter (Addendum)
Message copied by Rosalyn Charters on Tue Jan 20, 2012  2:36 PM ------      Message from: Melene Plan      Created: Tue Jan 20, 2012 12:24 PM       IMAGINE I HAVE BEEN INTERRUPTED & HAVE NOOOO IDEA IF THIS WAS SENT ALREADY      ----- Message -----         From: Nada Libman, MD         Sent: 01/20/2012  11:49 AM           To: Almetta Lovely, RN            01/20/2012, the patient had the following procedures:             1.  ultrasound access left femoral artery       2.  abdominal aortogram       3.  bilateral lower extremity runoff       4.  second order catheterization       5.  angioplasty right femoral artery       6.  followup x1                  Please schedule the patient to come back in 3 months for followup with a duplex ultrasound of his bilateral lower extremity and ABI        05-07-013 UNABLE TO REACH PT. BY PHONE MAILED APPT. INFO [employer]

## 2012-01-20 NOTE — H&P (View-Only) (Signed)
Notnamed Croucher IV, V. WELLS, MD 09/29/2011 6:00 PM Signed  [facility]   Patient name: Ronald Frank MRN: [id number] DOB: 04/19/1935 Sex: male  Chief Complaint   Patient presents with   .  PVD     6 month f/up - bilateral calf's hurts    HISTORY OF PRESENT ILLNESS:  The patient is back today for continued followup is bilateral claudication. He is status post right to left femoral-femoral bypass graft in 2001 by Dr. Hayes for a left external iliac occlusion. He does have a history of a contrast reaction consisting of itching and I swelling. He has subsequently had contrast with premedication and no symptoms. A year ago he could walk approximately 100 feet before his legs began to her and clamp now let us down to approximately 50-60 feet. We have discussed intervening and his proximal fem-fem bypass graft stenosis a year ago however due to his wife's health he had elected not to get this done. He is back today for further followup. He does not have any active ulceration at this time.  Past Medical History   Diagnosis  Date   .  COPD (chronic obstructive pulmonary disease)    .  Hyperlipidemia    .  Myocardial infarction  2003   .  Arthritis    .  Joint pain    .  Bronchitis    .  Productive cough    .  Anxiety     Past Surgical History   Procedure  Date   .  Total hip arthroplasty  2005   .  Coronary artery bypass graft  2003   .  Femoral artery - femoral artery bypass graft  2003     right to left   .  Lumbar disc surgery      x2 1999 & 2000    History    Social History   .  Marital Status:  Married     Spouse Name:  N/A     Number of Children:  N/A   .  Years of Education:  N/A    Occupational History   .  Not on file.    Social History Main Topics   .  Smoking status:  Former Smoker     Types:  Cigarettes     Quit date:  09/15/2001   .  Smokeless tobacco:  Not on file   .  Alcohol Use:  No   .  Drug Use:    .  Sexually Active:     Other Topics   Concern   .  Not on file    Social History Narrative   .  No narrative on file    History reviewed. No pertinent family history.  Allergies as of 09/29/2011 - Review Complete 09/29/2011   Allergen  Reaction  Noted   .  Actos (pioglitazone hydrochloride)   08/25/2011   .  Erythromycin   08/25/2011   .  Lexapro   08/25/2011   .  Lipitor (atorvastatin calcium)   08/25/2011   .  Omnipaque (iohexol)   08/25/2011   .  Pletaal (cilostazol)   08/25/2011    Current Outpatient Prescriptions on File Prior to Visit   Medication  Sig  Dispense  Refill   .  ALBUTEROL IN  Inhale into the lungs.     .  aspirin EC 81 MG tablet  Take 81 mg by mouth daily.     .  Cinnamon   500 MG capsule  Take 500 mg by mouth daily.     .  ezetimibe (ZETIA) 10 MG tablet  Take 10 mg by mouth daily.     .  Fexofenadine HCl (ALLEGRA PO)  Take by mouth 2 (two) times daily.     .  HYDROcodone-acetaminophen (VICODIN) 5-500 MG per tablet  Take 1 tablet by mouth every 6 (six) hours as needed.     .  LORazepam (ATIVAN) 0.5 MG tablet  Take 1-1.5 mg by mouth daily.     .  losartan (COZAAR) 50 MG tablet  Take 50 mg by mouth 2 (two) times daily.     .  metFORMIN (GLUCOPHAGE) 500 MG tablet  Take 500 mg by mouth 2 (two) times daily with a meal.     .  metoprolol succinate (TOPROL-XL) 12.5 mg TB24  Take 12.5 mg by mouth daily.     .  omeprazole (PRILOSEC) 20 MG capsule  Take 20 mg by mouth daily.     .  rosuvastatin (CRESTOR) 40 MG tablet  Take 40 mg by mouth daily.     .  Tamsulosin HCl (FLOMAX) 0.4 MG CAPS  Take 0.4 mg by mouth daily.     .  tiotropium (SPIRIVA) 18 MCG inhalation capsule  Place 18 mcg into inhaler and inhale daily.     .  venlafaxine (EFFEXOR-XR) 75 MG 24 hr capsule  Take 75 mg by mouth 2 (two) times daily.     .  vitamin C (ASCORBIC ACID) 500 MG tablet  Take 500 mg by mouth daily.     .  Vitamin D, Ergocalciferol, (DRISDOL) 50000 UNITS CAPS  Take 50,000 Units by mouth every 7 (seven) days.     .  zolpidem (AMBIEN)  10 MG tablet  Take 10 mg by mouth at bedtime as needed.      REVIEW OF SYSTEMS:  Positive for chest pain, shortness of breath when lying flat shortness of breath with exertion pain in his legs with walking weakness in his arms and legs numbness in his arms and legs and a skin rash. All other review of systems are negative.  PHYSICAL EXAMINATION:  Vital signs are BP 152/82  Pulse 90  Resp 16  Ht 5' 10" (1.778 m)  Wt 226 lb (102.513 kg)  BMI 32.43 kg/m2  SpO2 90%  General: The patient appears their stated age.  HEENT: No gross abnormalities  Pulmonary: Non labored breathing  Abdomen: Soft and non-tender, obese  Musculoskeletal: There are no major deformities.  Neurologic: No focal weakness or paresthesias are detected,  Skin: There are no ulcer or rashes noted.  Psychiatric: The patient has normal affect.  Cardiovascular: There is a regular rate and rhythm without significant murmur appreciated. Nonpalpable pedal pulse  Diagnostic Studies  Duplex ultrasound was performed today and interpreted. This shows an elevation in the proximal portion of the femoral-femoral bypass graft on the right groin. Peak velocity of 403 cm/s. This is increased from 300 one year ago. There are elevated velocities in the external iliac artery on the right measuring 209 cm/s. There is a mid graft stenosis with velocities of 173 cm/s  Assessment:  Bilateral claudication with history of right to left femoral-femoral bypass graft, now with bypass graft stenosis.  Plan:  The patient's bypass graft stenosis is in the origin of the right groin anastomosis. I don't think this explains his bilateral symptoms however I do feel that he needs to have a formal arteriogram to have   this formally evaluated. I believe the best way to start is to access the femoral-femoral bypass graft in the left groin and to try to get a catheter into the aorta from there I can then perform bilateral runoff to get an evaluation both legs and  potentially intervene in the right groin. The patient will contact me to get this scheduled as he is to arrange transportation with his daughter. He will be premedicated for his contrast allergy prior to the procedure    V. Wells Demian Maisel IV, M.D.  [facility] [place]  Office: [phone]  Pager: [phone]      

## 2012-04-16 ENCOUNTER — Encounter: Payer: Self-pay | Admitting: Neurosurgery

## 2012-04-19 ENCOUNTER — Encounter (INDEPENDENT_AMBULATORY_CARE_PROVIDER_SITE_OTHER): Payer: Medicare Other | Admitting: *Deleted

## 2012-04-19 ENCOUNTER — Ambulatory Visit (INDEPENDENT_AMBULATORY_CARE_PROVIDER_SITE_OTHER): Payer: Medicare Other | Admitting: Neurosurgery

## 2012-04-19 ENCOUNTER — Encounter: Payer: Self-pay | Admitting: Neurosurgery

## 2012-04-19 VITALS — BP 150/87 | HR 98 | Resp 20 | Ht 70.5 in | Wt 228.2 lb

## 2012-04-19 DIAGNOSIS — I70219 Atherosclerosis of native arteries of extremities with intermittent claudication, unspecified extremity: Secondary | ICD-10-CM

## 2012-04-19 DIAGNOSIS — I739 Peripheral vascular disease, unspecified: Secondary | ICD-10-CM

## 2012-04-19 DIAGNOSIS — Z48812 Encounter for surgical aftercare following surgery on the circulatory system: Secondary | ICD-10-CM

## 2012-04-19 DIAGNOSIS — T82898A Other specified complication of vascular prosthetic devices, implants and grafts, initial encounter: Secondary | ICD-10-CM

## 2012-04-19 NOTE — Progress Notes (Addendum)
[facility] [place] PAD/PVD Office Note  CC: Annual PVD surveillance Referring Physician: Brabham  History of Present Illness: 77 year old male patient of Dr. Myra Gianotti that status post a right to left fem-fem bypass graft with Dr. Madilyn Fireman in 2001. The patient states he does have some calf pain from time to time but is mostly when he is climbing steps or an incline. The patient does not report true claudication, rest pain or open ulcerations on the lower extremities. The patient denies any new medical diagnoses recent surgery.  Past Medical History  Diagnosis Date  . COPD (chronic obstructive pulmonary disease)   . Hyperlipidemia   . Myocardial infarction 2003  . Arthritis   . Joint pain   . Bronchitis   . Productive cough   . Anxiety     ROS: [x]  Positive   [ ]  Denies    General: [ ]  Weight loss, [ ]  Fever, [ ]  chills Neurologic: [ ]  Dizziness, [ ]  Blackouts, [ ]  Seizure [ ]  Stroke, [ ]  "Mini stroke", [ ]  Slurred speech, [ ]  Temporary blindness; x ] weakness in arms or legs, [ ]  Hoarseness Cardiac: [ ]  Chest pain/pressure, [x ] Shortness of breath at rest [ ]  Shortness of breath with exertion, [ ]  Atrial fibrillation or irregular heartbeat Vascular: [ x] Pain in legs with walking, [ ]  Pain in legs at rest, [ ]  Pain in legs at night,  [ ]  Non-healing ulcer, [ ]  Blood clot in vein/DVT,   Pulmonary: [ ]  Home oxygen, [ ]  Productive cough, [ ]  Coughing up blood, [ ]  Asthma,  [ ]  Wheezing Musculoskeletal:  [ ]  Arthritis, [ ]  Low back pain, [ ]  Joint pain Hematologic: [ ]  Easy Bruising, [ ]  Anemia; [ ]  Hepatitis Gastrointestinal: [ ]  Blood in stool, [ ]  Gastroesophageal Reflux/heartburn, [ ]  Trouble swallowing Urinary: [ ]  chronic Kidney disease, [ ]  on HD - [ ]  MWF or [ ]  TTHS, [ ]  Burning with urination, [ ]  Difficulty urinating Skin: [ ]  Rashes, [ ]  Wounds Psychological: [ ]  Anxiety, [ ]  Depression   Social History History  Substance Use Topics  . Smoking  status: Former Smoker    Types: Cigarettes    Quit date: 09/15/2001  . Smokeless tobacco: Not on file  . Alcohol Use: No    Family History History reviewed. No pertinent family history.  Allergies  Allergen Reactions  . Actos (Pioglitazone Hydrochloride) Other (See Comments)    unkknown  . Erythromycin Hives and Itching  . Escitalopram Oxalate Hives and Itching  . Lipitor (Atorvastatin Calcium) Other (See Comments)    Weakness   . Omnipaque (Iohexol) Other (See Comments)    unknown  . Pletaal (Cilostazol) Other (See Comments)    unknown    Current Outpatient Prescriptions  Medication Sig Dispense Refill  . albuterol (PROVENTIL HFA;VENTOLIN HFA) 108 (90 BASE) MCG/ACT inhaler Inhale 2 puffs into the lungs every 6 (six) hours as needed. For wheezing      . aspirin EC 81 MG tablet Take 81 mg by mouth daily.        . Cinnamon 500 MG capsule Take 500 mg by mouth daily.       . clopidogrel (PLAVIX) 75 MG tablet Take 1 tablet (75 mg total) by mouth daily.  30 tablet  11  . ezetimibe (ZETIA) 10 MG tablet Take 10 mg by mouth daily.        Marland Kitchen Fexofenadine HCl (ALLEGRA PO) Take 1 tablet  by mouth 2 (two) times daily.       Marland Kitchen HYDROcodone-acetaminophen (VICODIN) 5-500 MG per tablet Take 1 tablet by mouth 2 (two) times daily as needed. For pain      . LORazepam (ATIVAN) 0.5 MG tablet Take 1 mg by mouth 3 (three) times daily. For anxiety      . losartan (COZAAR) 50 MG tablet Take 50 mg by mouth 2 (two) times daily.        . metFORMIN (GLUCOPHAGE) 500 MG tablet Take 500 mg by mouth 2 (two) times daily with a meal.        . methocarbamol (ROBAXIN) 500 MG tablet Take by mouth as needed.      . metoprolol succinate (TOPROL-XL) 12.5 mg TB24 Take 12.5 mg by mouth daily.        . Multiple Vitamins-Minerals (EYE VITAMINS PO) Take 1 tablet by mouth 2 (two) times daily.       Marland Kitchen omeprazole (PRILOSEC) 20 MG capsule Take 20 mg by mouth daily.        . rosuvastatin (CRESTOR) 40 MG tablet Take 20 mg by mouth  daily.       . Tamsulosin HCl (FLOMAX) 0.4 MG CAPS Take 0.4 mg by mouth daily.       Marland Kitchen tiotropium (SPIRIVA) 18 MCG inhalation capsule Place 18 mcg into inhaler and inhale daily.       Marland Kitchen venlafaxine (EFFEXOR-XR) 75 MG 24 hr capsule Take 75 mg by mouth daily.       . vitamin C (ASCORBIC ACID) 500 MG tablet Take 500 mg by mouth daily.        . Vitamin D, Ergocalciferol, (DRISDOL) 50000 UNITS CAPS Take 50,000 Units by mouth every 7 (seven) days. On Fridays      . zolpidem (AMBIEN) 10 MG tablet Take 10 mg by mouth at bedtime.         Physical Examination  Filed Vitals:   04/19/12 1536  BP: 150/87  Pulse: 98  Resp: 20    Body mass index is 32.28 kg/(m^2).  General:  WDWN in NAD Gait: Normal HEENT: WNL Eyes: Pupils equal Pulmonary: normal non-labored breathing , without Rales, rhonchi,  wheezing Cardiac: RRR, without  Murmurs, rubs or gallops; No carotid bruits Abdomen: soft, NT, no masses Skin: no rashes, ulcers noted Vascular Exam/Pulses: Palpable femoral pulses bilaterally, PT and DP pulses are present bilaterally  Extremities without ischemic changes, no Gangrene , no cellulitis; no open wounds;  Musculoskeletal: no muscle wasting or atrophy  Neurologic: A&O X 3; Appropriate Affect ; SENSATION: normal; MOTOR FUNCTION:  moving all extremities equally. Speech is fluent/normal  Non-Invasive Vascular Imaging: ABIs today are 1.37 and biphasic on the right, 0.85 and biphasic on the left with a patent right to left fem-fem bypass graft with some elevation in velocities at the proximal anastomosis.  ASSESSMENT/PLAN: Patient is asymptomatic from symptoms of claudication. The patient will followup here in one year with repeat ABIs and graft duplex. The patient's questions were encouraged and answered, he is in agreement with this plan.  Lauree Chandler ANP  Clinic M.D.: Myra Gianotti

## 2012-04-26 NOTE — Procedures (Unsigned)
BYPASS GRAFT EVALUATION  INDICATION:  Right-to-left femoral-to-femoral bypass graft placed 2001 by Dr. Madilyn Fireman.  HISTORY: Diabetes:  Yes Cardiac: Hypertension:  Yes. Smoking:  Previous. Previous Surgery:  Right-to-left femoral-to-femoral bypass graft.  SINGLE LEVEL ARTERIAL EXAM                              RIGHT              LEFT Brachial: Anterior tibial: Posterior tibial: Peroneal: Ankle/brachial index:        1.37               0.85  PREVIOUS ABI:  Date:  09/29/2011  RIGHT:  0.91  LEFT:  0.87  LOWER EXTREMITY BYPASS GRAFT DUPLEX EXAM:  DUPLEX:  Elevated velocities are observed at the proximal anastomosis of the right-to-left femoral-femoral bypass graft.  Mild to moderate diffuse disease is observed in the right external iliac native inflow artery.  IMPRESSION: 1. Patent right-to-left femoral-to-femoral bypass graft with elevated     velocities at the proximal anastomosis/proximal graft. 2. Please see attached diagram for details.  ___________________________________________ V. Charlena Cross, MD  SS/MEDQ  D:  04/19/2012  T:  04/19/2012  Job:  [id number]

## 2012-06-04 ENCOUNTER — Ambulatory Visit
Admission: RE | Admit: 2012-06-04 | Discharge: 2012-06-04 | Disposition: A | Discharge status: Home / Self Care | Payer: Medicare Other | Source: Ambulatory Visit | Attending: Cardiology | Admitting: Cardiology

## 2012-06-04 ENCOUNTER — Other Ambulatory Visit: Payer: Self-pay | Admitting: Cardiology

## 2012-06-04 DIAGNOSIS — R0789 Other chest pain: Secondary | ICD-10-CM

## 2012-06-16 ENCOUNTER — Other Ambulatory Visit: Payer: Self-pay | Admitting: *Deleted

## 2012-06-16 DIAGNOSIS — I70219 Atherosclerosis of native arteries of extremities with intermittent claudication, unspecified extremity: Secondary | ICD-10-CM

## 2012-06-16 DIAGNOSIS — Z48812 Encounter for surgical aftercare following surgery on the circulatory system: Secondary | ICD-10-CM

## 2012-07-24 ENCOUNTER — Emergency Department (HOSPITAL_COMMUNITY): Payer: Medicare Other

## 2012-07-24 ENCOUNTER — Encounter (HOSPITAL_COMMUNITY): Payer: Self-pay | Admitting: *Deleted

## 2012-07-24 ENCOUNTER — Inpatient Hospital Stay (HOSPITAL_COMMUNITY)
Admission: EM | Admit: 2012-07-24 | Discharge: 2012-07-27 | DRG: 065 | Disposition: A | Discharge status: Home Health | Payer: Medicare Other | Attending: Family Medicine | Admitting: Family Medicine

## 2012-07-24 DIAGNOSIS — F411 Generalized anxiety disorder: Secondary | ICD-10-CM | POA: Diagnosis present

## 2012-07-24 DIAGNOSIS — I6350 Cerebral infarction due to unspecified occlusion or stenosis of unspecified cerebral artery: Principal | ICD-10-CM

## 2012-07-24 DIAGNOSIS — Z951 Presence of aortocoronary bypass graft: Secondary | ICD-10-CM

## 2012-07-24 DIAGNOSIS — G8190 Hemiplegia, unspecified affecting unspecified side: Secondary | ICD-10-CM | POA: Diagnosis present

## 2012-07-24 DIAGNOSIS — I639 Cerebral infarction, unspecified: Secondary | ICD-10-CM | POA: Diagnosis present

## 2012-07-24 DIAGNOSIS — Z79899 Other long term (current) drug therapy: Secondary | ICD-10-CM

## 2012-07-24 DIAGNOSIS — Z7982 Long term (current) use of aspirin: Secondary | ICD-10-CM

## 2012-07-24 DIAGNOSIS — Z888 Allergy status to other drugs, medicaments and biological substances status: Secondary | ICD-10-CM

## 2012-07-24 DIAGNOSIS — J449 Chronic obstructive pulmonary disease, unspecified: Secondary | ICD-10-CM

## 2012-07-24 DIAGNOSIS — R471 Dysarthria and anarthria: Secondary | ICD-10-CM | POA: Diagnosis present

## 2012-07-24 DIAGNOSIS — E785 Hyperlipidemia, unspecified: Secondary | ICD-10-CM | POA: Diagnosis present

## 2012-07-24 DIAGNOSIS — E119 Type 2 diabetes mellitus without complications: Secondary | ICD-10-CM | POA: Diagnosis present

## 2012-07-24 DIAGNOSIS — I10 Essential (primary) hypertension: Secondary | ICD-10-CM | POA: Diagnosis present

## 2012-07-24 DIAGNOSIS — Z881 Allergy status to other antibiotic agents status: Secondary | ICD-10-CM

## 2012-07-24 DIAGNOSIS — J4489 Other specified chronic obstructive pulmonary disease: Secondary | ICD-10-CM | POA: Diagnosis present

## 2012-07-24 DIAGNOSIS — Z96649 Presence of unspecified artificial hip joint: Secondary | ICD-10-CM

## 2012-07-24 DIAGNOSIS — I739 Peripheral vascular disease, unspecified: Secondary | ICD-10-CM | POA: Diagnosis present

## 2012-07-24 DIAGNOSIS — I2510 Atherosclerotic heart disease of native coronary artery without angina pectoris: Secondary | ICD-10-CM

## 2012-07-24 DIAGNOSIS — Z7902 Long term (current) use of antithrombotics/antiplatelets: Secondary | ICD-10-CM

## 2012-07-24 DIAGNOSIS — Z87891 Personal history of nicotine dependence: Secondary | ICD-10-CM

## 2012-07-24 HISTORY — DX: Gastro-esophageal reflux disease without esophagitis: K21.9

## 2012-07-24 HISTORY — DX: Type 2 diabetes mellitus without complications: E11.9

## 2012-07-24 HISTORY — DX: Cerebral infarction, unspecified: I63.9

## 2012-07-24 LAB — CBC WITH DIFFERENTIAL/PLATELET
Eosinophils Relative: 1 % (ref 0–5)
Lymphocytes Relative: 26 % (ref 12–46)
Lymphs Abs: 2.2 10*3/uL (ref 0.7–4.0)
MCV: 96 fL (ref 78.0–100.0)
Neutrophils Relative %: 66 % (ref 43–77)
Platelets: 213 10*3/uL (ref 150–400)
RBC: 4.03 MIL/uL — ABNORMAL LOW (ref 4.22–5.81)
WBC: 8.4 10*3/uL (ref 4.0–10.5)

## 2012-07-24 LAB — POCT I-STAT, CHEM 8
Chloride: 105 mEq/L (ref 96–112)
Creatinine, Ser: 1 mg/dL (ref 0.50–1.35)
Glucose, Bld: 126 mg/dL — ABNORMAL HIGH (ref 70–99)
Potassium: 4.1 mEq/L (ref 3.5–5.1)

## 2012-07-24 LAB — CBC
HCT: 36.9 % — ABNORMAL LOW (ref 39.0–52.0)
Hemoglobin: 12.7 g/dL — ABNORMAL LOW (ref 13.0–17.0)
MCH: 33.2 pg (ref 26.0–34.0)
MCHC: 34.4 g/dL (ref 30.0–36.0)
MCV: 96.3 fL (ref 78.0–100.0)
RBC: 3.83 MIL/uL — ABNORMAL LOW (ref 4.22–5.81)

## 2012-07-24 LAB — PROTIME-INR
INR: 0.88 (ref 0.00–1.49)
Prothrombin Time: 11.9 seconds (ref 11.6–15.2)

## 2012-07-24 LAB — CREATININE, SERUM: Creatinine, Ser: 0.95 mg/dL (ref 0.50–1.35)

## 2012-07-24 LAB — GLUCOSE, CAPILLARY

## 2012-07-24 MED ORDER — SODIUM CHLORIDE 0.9 % IV SOLN
INTRAVENOUS | Status: AC
  Administered 2012-07-24: 22:00:00 via INTRAVENOUS

## 2012-07-24 MED ORDER — CLOPIDOGREL BISULFATE 75 MG PO TABS
75.0000 mg | ORAL_TABLET | Freq: Every day | ORAL | Status: DC
Start: 2012-07-24 — End: 2012-07-24

## 2012-07-24 MED ORDER — LORAZEPAM 1 MG PO TABS
1.0000 mg | ORAL_TABLET | Freq: Three times a day (TID) | ORAL | Status: DC
  Administered 2012-07-24 – 2012-07-26 (×7): 1 mg via ORAL
  Filled 2012-07-24 (×7): qty 1

## 2012-07-24 MED ORDER — CLOPIDOGREL 0.02 MG/30 ML ORAL SUSPENSION: 0.0200 mg | Freq: Every day | ORAL | Status: DC

## 2012-07-24 MED ORDER — VENLAFAXINE HCL ER 75 MG PO CP24
75.0000 mg | ORAL_CAPSULE | Freq: Two times a day (BID) | ORAL | Status: DC
  Filled 2012-07-24: qty 1

## 2012-07-24 MED ORDER — ASPIRIN 325 MG PO TABS
325.0000 mg | ORAL_TABLET | Freq: Every day | ORAL | Status: DC
  Administered 2012-07-24 – 2012-07-25 (×2): 325 mg via ORAL
  Filled 2012-07-24 (×2): qty 1

## 2012-07-24 MED ORDER — SENNOSIDES-DOCUSATE SODIUM 8.6-50 MG PO TABS: 1.0000 | ORAL_TABLET | Freq: Every evening | ORAL | Status: DC | PRN

## 2012-07-24 MED ORDER — TAMSULOSIN HCL 0.4 MG PO CAPS
0.4000 mg | ORAL_CAPSULE | Freq: Every day | ORAL | Status: DC
  Administered 2012-07-24 – 2012-07-27 (×4): 0.4 mg via ORAL
  Filled 2012-07-24 (×4): qty 1

## 2012-07-24 MED ORDER — CLOPIDOGREL BISULFATE 75 MG PO TABS
75.0000 mg | ORAL_TABLET | Freq: Every day | ORAL | Status: DC
  Administered 2012-07-25 – 2012-07-27 (×3): 75 mg via ORAL
  Filled 2012-07-24 (×4): qty 1

## 2012-07-24 MED ORDER — VENLAFAXINE HCL ER 150 MG PO CP24
150.0000 mg | ORAL_CAPSULE | Freq: Every day | ORAL | Status: DC
  Administered 2012-07-24 – 2012-07-26 (×3): 150 mg via ORAL
  Filled 2012-07-24 (×4): qty 1

## 2012-07-24 MED ORDER — ZOLPIDEM TARTRATE 10 MG PO TABS
10.0000 mg | ORAL_TABLET | Freq: Every day | ORAL | Status: DC
  Administered 2012-07-24 – 2012-07-25 (×2): 10 mg via ORAL
  Filled 2012-07-24 (×2): qty 1

## 2012-07-24 MED ORDER — SODIUM CHLORIDE 0.9 % IV SOLN
INTRAVENOUS | Status: DC
  Administered 2012-07-24: 1000 mL via INTRAVENOUS
  Administered 2012-07-24: 17:00:00 via INTRAVENOUS

## 2012-07-24 MED ORDER — ONDANSETRON HCL 4 MG/2ML IJ SOLN: 4.0000 mg | Freq: Four times a day (QID) | INTRAMUSCULAR | Status: DC | PRN

## 2012-07-24 MED ORDER — ATORVASTATIN CALCIUM 40 MG PO TABS
40.0000 mg | ORAL_TABLET | Freq: Every day | ORAL | Status: DC
  Administered 2012-07-24: 40 mg via ORAL
  Filled 2012-07-24 (×2): qty 1

## 2012-07-24 MED ORDER — ASPIRIN 300 MG RE SUPP
300.0000 mg | Freq: Every day | RECTAL | Status: DC
  Filled 2012-07-24 (×2): qty 1

## 2012-07-24 MED ORDER — METOPROLOL SUCCINATE 12.5 MG HALF TABLET
12.5000 mg | ORAL_TABLET | Freq: Every day | ORAL | Status: DC
  Administered 2012-07-25 – 2012-07-27 (×3): 12.5 mg via ORAL
  Filled 2012-07-24 (×3): qty 1

## 2012-07-24 MED ORDER — HYDRALAZINE HCL 20 MG/ML IJ SOLN
10.0000 mg | Freq: Four times a day (QID) | INTRAMUSCULAR | Status: DC | PRN
  Filled 2012-07-24: qty 0.5

## 2012-07-24 MED ORDER — ENOXAPARIN SODIUM 40 MG/0.4ML ~~LOC~~ SOLN
40.0000 mg | SUBCUTANEOUS | Status: DC
  Administered 2012-07-24 – 2012-07-26 (×3): 40 mg via SUBCUTANEOUS
  Filled 2012-07-24 (×4): qty 0.4

## 2012-07-24 MED ORDER — PANTOPRAZOLE SODIUM 40 MG PO TBEC
40.0000 mg | DELAYED_RELEASE_TABLET | Freq: Every day | ORAL | Status: DC
  Administered 2012-07-25 – 2012-07-26 (×2): 40 mg via ORAL
  Filled 2012-07-24: qty 1

## 2012-07-24 MED ORDER — TIOTROPIUM BROMIDE MONOHYDRATE 18 MCG IN CAPS
18.0000 ug | ORAL_CAPSULE | Freq: Every day | RESPIRATORY_TRACT | Status: DC
  Filled 2012-07-24: qty 5

## 2012-07-24 MED ORDER — EZETIMIBE 10 MG PO TABS
10.0000 mg | ORAL_TABLET | Freq: Every day | ORAL | Status: DC
  Administered 2012-07-25 – 2012-07-27 (×3): 10 mg via ORAL
  Filled 2012-07-24 (×3): qty 1

## 2012-07-24 MED ORDER — INSULIN ASPART 100 UNIT/ML ~~LOC~~ SOLN
0.0000 [IU] | Freq: Three times a day (TID) | SUBCUTANEOUS | Status: DC
  Administered 2012-07-25 – 2012-07-26 (×4): 1 [IU] via SUBCUTANEOUS

## 2012-07-24 NOTE — ED Notes (Signed)
I gave the patient a cup of ice and a diet coke. 

## 2012-07-24 NOTE — ED Notes (Signed)
Report called to [facility] RN room [phone] bed is ready

## 2012-07-24 NOTE — Consult Note (Signed)
Referring Physician: Radford Pax    Chief Complaint: Left sided weakness  HPI: Ronald Frank is a (RH) 77 y.o. male who reports noting at about 3AM that he was unable to use his left arm well.  The patient reports that he was unable to sleep because he did not fel well.  Went to reach for something and noted that his left arm was weak.  He waited to see if the weakness would resolve on its own but it did not.  His daughter called EMS and the patient was brought in for evaluation.   Patient on ASA and Plavix at home.  LSN: [id number] tPA Given: No: Outside time window  Past Medical History  Diagnosis Date  . COPD (chronic obstructive pulmonary disease)   . Hyperlipidemia   . Myocardial infarction 2003  . Arthritis   . Joint pain   . Bronchitis   . Productive cough   . Anxiety     Past Surgical History  Procedure Date  . Total hip arthroplasty 2005  . Coronary artery bypass graft 2003  . Femoral artery - femoral artery bypass graft 2003    right to left  . Lumbar disc surgery     x2 1999 & 2000  . Aortagram 01/20/2012    Abdominal Aortagram    Family history:  No family history of stroke.  Both mother and father are deceased and died of complications of alcoholism.  Social History:  reports that he quit smoking about 10 years ago. His smoking use included Cigarettes. He does not have any smokeless tobacco history on file. He reports that he does not drink alcohol or use illicit drugs.  Allergies:  Allergies  Allergen Reactions  . Actos (Pioglitazone Hydrochloride) Other (See Comments)    unkknown  . Erythromycin Hives and Itching  . Escitalopram Oxalate Hives and Itching  . Lipitor (Atorvastatin Calcium) Other (See Comments)    Weakness   . Omnipaque (Iohexol) Other (See Comments)    unknown  . Pletaal (Cilostazol) Other (See Comments)    unknown    Medications: I have reviewed the patient's current medications. Prior to Admission:   Current outpatient  prescriptions: albuterol (PROVENTIL HFA;VENTOLIN HFA) 108 (90 BASE) MCG/ACT inhaler, Inhale 2 puffs into the lungs every 6 (six) hours as needed. For wheezing, Disp: , Rfl: ;   aspirin EC 81 MG tablet, Take 81 mg by mouth daily.  , Disp: , Rfl: ;   clopidogrel (PLAVIX) 75 MG tablet, Take 1 tablet (75 mg total) by mouth daily., Disp: 30 tablet, Rfl: 11;   ezetimibe (ZETIA) 10 MG tablet, Take 10 mg by mouth daily.  , Disp: , Rfl:  Fexofenadine HCl (ALLEGRA PO), Take 1 tablet by mouth 2 (two) times daily. , Disp: , Rfl: ;   HYDROcodone-acetaminophen (VICODIN) 5-500 MG per tablet, Take 1 tablet by mouth 2 (two) times daily as needed. For pain, Disp: , Rfl: ;  LORazepam (ATIVAN) 0.5 MG tablet, Take 1 mg by mouth 3 (three) times daily. For anxiety, Disp: , Rfl: ;   losartan (COZAAR) 50 MG tablet, Take 50 mg by mouth 2 (two) times daily.  , Disp: , Rfl:  metFORMIN (GLUCOPHAGE) 500 MG tablet, Take 500 mg by mouth 2 (two) times daily with a meal.  , Disp: , Rfl: ;   metoprolol succinate (TOPROL-XL) 12.5 mg TB24, Take 12.5 mg by mouth daily.  , Disp: , Rfl: ;   Multiple Vitamins-Minerals (EYE VITAMINS PO), Take 1 tablet by mouth  2 (two) times daily. , Disp: , Rfl: ;  omeprazole (PRILOSEC) 20 MG capsule, Take 20 mg by mouth daily.  , Disp: , Rfl:  rosuvastatin (CRESTOR) 40 MG tablet, Take 20 mg by mouth daily. , Disp: , Rfl: ;   Tamsulosin HCl (FLOMAX) 0.4 MG CAPS, Take 0.4 mg by mouth daily. , Disp: , Rfl: ;   tiotropium (SPIRIVA) 18 MCG inhalation capsule, Place 18 mcg into inhaler and inhale daily. , Disp: , Rfl: ;   venlafaxine (EFFEXOR-XR) 75 MG 24 hr capsule, Take 75 mg by mouth 2 (two) times daily. , Disp: , Rfl:  vitamin C (ASCORBIC ACID) 500 MG tablet, Take 500 mg by mouth daily.  , Disp: , Rfl: ;   Vitamin D, Ergocalciferol, (DRISDOL) 50000 UNITS CAPS, Take 50,000 Units by mouth every 7 (seven) days. On Fridays, Disp: , Rfl: ;   zolpidem (AMBIEN) 10 MG tablet, Take 10 mg by mouth at bedtime. , Disp:  , Rfl: ;   methocarbamol (ROBAXIN) 500 MG tablet, Take by mouth as needed., Disp: , Rfl:   ROS: History obtained from the patient  General ROS: negative for - chills, fatigue, fever, night sweats, weight gain or weight loss Psychological ROS: negative for - behavioral disorder, hallucinations, memory difficulties, mood swings or suicidal ideation Ophthalmic ROS: negative for - blurry vision, double vision, eye pain or loss of vision ENT ROS: negative for - epistaxis, nasal discharge, oral lesions, sore throat, tinnitus or vertigo Allergy and Immunology ROS: negative for - hives or itchy/watery eyes Hematological and Lymphatic ROS: negative for - bleeding problems, bruising or swollen lymph nodes Endocrine ROS: negative for - galactorrhea, hair pattern changes, polydipsia/polyuria or temperature intolerance Respiratory ROS: negative for - cough, hemoptysis, shortness of breath or wheezing Cardiovascular ROS: negative for - chest pain, dyspnea on exertion, edema or irregular heartbeat Gastrointestinal ROS: negative for - abdominal pain, diarrhea, hematemesis, nausea/vomiting or stool incontinence Genito-Urinary ROS: negative for - dysuria, hematuria, incontinence or urinary frequency/urgency Musculoskeletal ROS: negative for - joint swelling or muscular weakness Neurological ROS: as noted in HPI Dermatological ROS: negative for rash and skin lesion changes  Physical Examination: Blood pressure 176/78, pulse 100, temperature 98.2 F (36.8 C), temperature source Oral, resp. rate 23, height 5\' 10"  (1.778 m), weight 101.152 kg (223 lb), SpO2 98.00%.  Neurologic Examination: Mental Status: Alert, oriented, thought content appropriate.  Speech fluent without evidence of aphasia but slurred.  Able to follow 3 step commands without difficulty. Cranial Nerves: II: Discs flat bilaterally; Visual fields grossly normal, pupils equal, round, reactive to light and accommodation III,IV, VI: ptosis not  present, extra-ocular motions intact bilaterally V,VII: left facial droop, facial light touch sensation normal bilaterally VIII: hearing normal bilaterally IX,X: gag reflex reduced XI: bilateral shoulder shrug XII: midline tongue extension Motor: Right : Upper extremity   5/5    Left:     Upper extremity   3/5; Hand grip 1/5  Lower extremity   5/5     Lower extremity   5/5 Tone and bulk:normal tone throughout; no atrophy noted Sensory: Pinprick and light touch intact throughout, bilaterally Deep Tendon Reflexes: 2+ in the upper extremities, 1+ at the left knee, 2+ at the right knee, 1+ right ankle jerk, absent left ankle jerk Plantars: Right: downgoing   Left: downgoing Cerebellar: normal finger-to-nose using the right upper extremity and normal heel-to-shin testing bilaterally Gait: unable to test CV: pulses palpable throughout   Laboratory Studies:  Basic Metabolic Panel:  Lab 07/24/12 4098  NA 140  K 4.1  CL 105  CO2 --  GLUCOSE 126*  BUN 13  CREATININE 1.00  CALCIUM --  MG --  PHOS --    Liver Function Tests: No results found for this basename: AST:5,ALT:5,ALKPHOS:5,BILITOT:5,PROT:5,ALBUMIN:5 in the last 168 hours No results found for this basename: LIPASE:5,AMYLASE:5 in the last 168 hours No results found for this basename: AMMONIA:3 in the last 168 hours  CBC:  Lab 07/24/12 1621 07/24/12 1549  WBC -- 8.4  NEUTROABS -- 5.6  HGB 13.9 13.3  HCT 41.0 38.7*  MCV -- 96.0  PLT -- 213    Cardiac Enzymes: No results found for this basename: CKTOTAL:5,CKMB:5,CKMBINDEX:5,TROPONINI:5 in the last 168 hours  BNP: No components found with this basename: POCBNP:5  CBG: No results found for this basename: GLUCAP:5 in the last 168 hours  Microbiology: No results found for this or any previous visit.  Coagulation Studies:  Basename 07/24/12 1549  LABPROT 11.9  INR 0.88    Urinalysis: No results found for this basename:  COLORURINE:2,APPERANCEUR:2,LABSPEC:2,PHURINE:2,GLUCOSEU:2,HGBUR:2,BILIRUBINUR:2,KETONESUR:2,PROTEINUR:2,UROBILINOGEN:2,NITRITE:2,LEUKOCYTESUR:2 in the last 168 hours  Lipid Panel: No results found for this basename: chol, trig, hdl, cholhdl, vldl, ldlcalc    HgbA1C:  No results found for this basename: HGBA1C    Urine Drug Screen:   No results found for this basename: labopia, cocainscrnur, labbenz, amphetmu, thcu, labbarb    Alcohol Level: No results found for this basename: ETH:2 in the last 168 hours   Imaging: Dg Chest 2 View  07/24/2012  *RADIOLOGY REPORT*  Clinical Data: Short of breath.  Left-sided weakness.  Previous CABG.  CHEST - 2 VIEW  Comparison: 06/04/2012  Findings: There has been previous median sternotomy and CABG. Heart size is normal.  There is chronic scarring in the region of the lingula.  No infiltrate, mass, effusion or collapse.  No pulmonary edema.  Slightly chronically prominent interstitial markings.  IMPRESSION: No active disease.  Previous CABG.  Chronic scarring.   Original Report Authenticated By: Paulina Fusi, M.D.    Ct Head Wo Contrast  07/24/2012  *RADIOLOGY REPORT*  Clinical Data: Left-sided weakness.  CT HEAD WITHOUT CONTRAST  Technique:  Contiguous axial images were obtained from the base of the skull through the vertex without contrast.  Comparison: None.  Findings: The brain shows generalized atrophy.  There are extensive chronic appearing small vessel changes throughout the hemispheric white matter.  There is a cortical/subcortical infarction in the left parietal lobe.  There are a few old lacunar infarctions in the basal ganglia.  No identifiably acute infarction, mass lesion, hemorrhage, hydrocephalus or extra-axial collection.  There is atherosclerotic calcification of the major vessels at the base of the brain.  The calvarium is unremarkable.  Sinuses, middle ears and mastoids are clear.  IMPRESSION: No identifiably acute insult.  Atrophy.  Extensive  chronic small vessel disease.  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   Original Report Authenticated By: Paulina Fusi, M.D.     Assessment: 77 y.o. male presenting with left facial droop and left upper extremity weakness.  CT shows no acute abnormalities.  Exam and clinical history consistent with a right brain acute infarct.  Further work up recommended.  Stroke Risk Factors - hyperlipidemia and CAD  Plan: 1. HgbA1c, fasting lipid panel 2. MRI, MRA  of the brain without contrast 3. PT consult, OT consult, Speech consult 4. Echocardiogram 5. Carotid dopplers 6. Prophylactic therapy-Continue ASA and Plavix 7. Risk factor modification 8. Telemetry monitoring 9. Frequent neuro checks  Case discussed with  Dr. Cecilio Asper, MD [facility] [phone] 07/24/2012, 6:05 PM

## 2012-07-24 NOTE — ED Provider Notes (Signed)
History     CSN: [id number]  Arrival date & time 07/24/12  1541   First MD Initiated Contact with Patient 07/24/12 1544      Chief Complaint  Patient presents with  . Extremity Weakness     HPI Pt states he was getting ready for bed around 0200 this am, noticed left sided weakness around 0300. Pt states that he does not have a history of strokes in the past. States that he was not able to lift his left arm up or grip with his left hand  Past Medical History  Diagnosis Date  . COPD (chronic obstructive pulmonary disease)   . Hyperlipidemia   . Myocardial infarction 2003  . Arthritis   . Joint pain   . Bronchitis   . Productive cough   . Anxiety     Past Surgical History  Procedure Date  . Total hip arthroplasty 2005  . Coronary artery bypass graft 2003  . Femoral artery - femoral artery bypass graft 2003    right to left  . Lumbar disc surgery     x2 1999 & 2000  . Aortagram 01/20/2012    Abdominal Aortagram    No family history on file.  History  Substance Use Topics  . Smoking status: Former Smoker    Types: Cigarettes    Quit date: 09/15/2001  . Smokeless tobacco: Not on file  . Alcohol Use: No      Review of Systems  All other systems reviewed and are negative.    Allergies  Actos; Erythromycin; Escitalopram oxalate; Lipitor; Omnipaque; and Pletaal  Home Medications   Current Outpatient Rx  Name  Route  Sig  Dispense  Refill  . ALBUTEROL SULFATE HFA 108 (90 BASE) MCG/ACT IN AERS   Inhalation   Inhale 2 puffs into the lungs every 6 (six) hours as needed. For wheezing         . ASPIRIN EC 81 MG PO TBEC   Oral   Take 81 mg by mouth daily.           Marland Kitchen CLOPIDOGREL BISULFATE 75 MG PO TABS   Oral   Take 1 tablet (75 mg total) by mouth daily.   30 tablet   11   . EZETIMIBE 10 MG PO TABS   Oral   Take 10 mg by mouth daily.           Joyce Copa PO   Oral   Take 1 tablet by mouth 2 (two) times daily.          Marland Kitchen  HYDROCODONE-ACETAMINOPHEN 5-500 MG PO TABS   Oral   Take 1 tablet by mouth 2 (two) times daily as needed. For pain         . LORAZEPAM 0.5 MG PO TABS   Oral   Take 1 mg by mouth 3 (three) times daily. For anxiety         . LOSARTAN POTASSIUM 50 MG PO TABS   Oral   Take 50 mg by mouth 2 (two) times daily.           Marland Kitchen METFORMIN HCL 500 MG PO TABS   Oral   Take 500 mg by mouth 2 (two) times daily with a meal.           . METOPROLOL SUCCINATE 12.5 MG HALF TABLET   Oral   Take 12.5 mg by mouth daily.           Marland Kitchen EYE VITAMINS PO  Oral   Take 1 tablet by mouth 2 (two) times daily.          Marland Kitchen OMEPRAZOLE 20 MG PO CPDR   Oral   Take 20 mg by mouth daily.           Marland Kitchen ROSUVASTATIN CALCIUM 40 MG PO TABS   Oral   Take 20 mg by mouth daily.          Marland Kitchen TAMSULOSIN HCL 0.4 MG PO CAPS   Oral   Take 0.4 mg by mouth daily.          Marland Kitchen TIOTROPIUM BROMIDE MONOHYDRATE 18 MCG IN CAPS   Inhalation   Place 18 mcg into inhaler and inhale daily.          . VENLAFAXINE HCL ER 75 MG PO CP24   Oral   Take 75 mg by mouth 2 (two) times daily.          Marland Kitchen VITAMIN C 500 MG PO TABS   Oral   Take 500 mg by mouth daily.           Marland Kitchen VITAMIN D (ERGOCALCIFEROL) 50000 UNITS PO CAPS   Oral   Take 50,000 Units by mouth every 7 (seven) days. On Fridays         . ZOLPIDEM TARTRATE 10 MG PO TABS   Oral   Take 10 mg by mouth at bedtime.          . METHOCARBAMOL 500 MG PO TABS   Oral   Take by mouth as needed.           BP 152/70  Pulse 104  Temp 98.2 F (36.8 C) (Oral)  Resp 18  Ht 5\' 10"  (1.778 m)  Wt 223 lb (101.152 kg)  BMI 32.00 kg/m2  SpO2 96%  Physical Exam  Nursing note and vitals reviewed. Constitutional: He is oriented to person, place, and time. He appears well-developed and well-nourished. No distress.  HENT:  Head: Normocephalic and atraumatic.  Eyes: Pupils are equal, round, and reactive to light.  Neck: Normal range of motion.  Cardiovascular:  Normal rate and intact distal pulses.   Pulmonary/Chest: No respiratory distress.  Abdominal: Normal appearance. He exhibits no distension.  Musculoskeletal: Normal range of motion.  Neurological: He is alert and oriented to person, place, and time. A cranial nerve deficit (Left-sided facial droop) is present. Coordination (Left hand coordination is abnormal) abnormal. GCS eye subscore is 4. GCS verbal subscore is 5. GCS motor subscore is 6.       Marked left arm/hand/leg weakness noted on exam.  Skin: Skin is warm and dry. No rash noted.  Psychiatric: He has a normal mood and affect. His behavior is normal.    ED Course  Procedures (including critical care time) EKG  Date: 07/24/2012  Rate: 103  Rhythm: normal sinus rhythm  QRS Axis: normal  Intervals: normal  ST/T Wave abnormalities: normal  Conduction Disutrbances: Right bundle branch block  Narrative Interpretation: Unchanged from previous tracing     Labs Reviewed  CBC WITH DIFFERENTIAL - Abnormal; Notable for the following:    RBC 4.03 (*)     HCT 38.7 (*)     All other components within normal limits  POCT I-STAT, CHEM 8 - Abnormal; Notable for the following:    Glucose, Bld 126 (*)     All other components within normal limits  PROTIME-INR   Dg Chest 2 View  07/24/2012  *RADIOLOGY REPORT*  Clinical Data: Short  of breath.  Left-sided weakness.  Previous CABG.  CHEST - 2 VIEW  Comparison: 06/04/2012  Findings: There has been previous median sternotomy and CABG. Heart size is normal.  There is chronic scarring in the region of the lingula.  No infiltrate, mass, effusion or collapse.  No pulmonary edema.  Slightly chronically prominent interstitial markings.  IMPRESSION: No active disease.  Previous CABG.  Chronic scarring.   Original Report Authenticated By: Paulina Fusi, M.D.    Ct Head Wo Contrast  07/24/2012  *RADIOLOGY REPORT*  Clinical Data: Left-sided weakness.  CT HEAD WITHOUT CONTRAST  Technique:  Contiguous axial  images were obtained from the base of the skull through the vertex without contrast.  Comparison: None.  Findings: The brain shows generalized atrophy.  There are extensive chronic appearing small vessel changes throughout the hemispheric white matter.  There is a cortical/subcortical infarction in the left parietal lobe.  There are a few old lacunar infarctions in the basal ganglia.  No identifiably acute infarction, mass lesion, hemorrhage, hydrocephalus or extra-axial collection.  There is atherosclerotic calcification of the major vessels at the base of the brain.  The calvarium is unremarkable.  Sinuses, middle ears and mastoids are clear.  IMPRESSION: No identifiably acute insult.  Atrophy.  Extensive chronic small vessel disease.  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   Original Report Authenticated By: Paulina Fusi, M.D.      1. CVA (cerebral infarction)       MDM  We'll admit to telemetry floor with neurology consult       Nelia Shi, MD 07/24/12 1739

## 2012-07-24 NOTE — ED Notes (Signed)
Pt states he was getting ready for bed around 0200 this am, noticed left sided weakness around 0300. Pt states that he does not have a history of strokes in the past. States that he was not able to lift his left arm up or grip with his left hand.

## 2012-07-24 NOTE — H&P (Signed)
PATIENT DETAILS Name: Ronald Frank Age: 77 y.o. Sex: male Date of Birth: 01-Apr-1935 Admit Date: 07/24/2012 ZOX:[id number] D, MD   CHIEF COMPLAINT:  Left sided weakness  HPI: Patient is a 77 year old right-handed Caucasian male with a past medical history of diabetes, hypertension, dyslipidemia, coronary artery disease, peripheral vascular disease who presents to the hospital for the above-noted complaints. Per patient he noticed left upper extremity weakness at 3 AM today. He decided not to come to the hospital right away. Since the weakness was persistent and he also started having dysarthria he presented to the hospital. A CT of the head was done which was negative. Patient denies any prior history of CVA. He denies any headache. He denies any fever. I was subsequently asked to admit this patient for further evaluation and treatment.   ALLERGIES:   Allergies  Allergen Reactions  . Actos (Pioglitazone Hydrochloride) Other (See Comments)    unkknown  . Erythromycin Hives and Itching  . Escitalopram Oxalate Hives and Itching  . Lipitor (Atorvastatin Calcium) Other (See Comments)    Weakness   . Omnipaque (Iohexol) Other (See Comments)    unknown  . Pletaal (Cilostazol) Other (See Comments)    unknown    PAST MEDICAL HISTORY: Past Medical History  Diagnosis Date  . COPD (chronic obstructive pulmonary disease)   . Hyperlipidemia   . Myocardial infarction 2003  . Arthritis   . Joint pain   . Bronchitis   . Productive cough   . Anxiety     PAST SURGICAL HISTORY: Past Surgical History  Procedure Date  . Total hip arthroplasty 2005  . Coronary artery bypass graft 2003  . Femoral artery - femoral artery bypass graft 2003    right to left  . Lumbar disc surgery     x2 1999 & 2000  . Aortagram 01/20/2012    Abdominal Aortagram    MEDICATIONS AT HOME: Prior to Admission medications   Medication Sig Start Date End Date Taking? Authorizing Provider  albuterol  (PROVENTIL HFA;VENTOLIN HFA) 108 (90 BASE) MCG/ACT inhaler Inhale 2 puffs into the lungs every 6 (six) hours as needed. For wheezing 10/23/11 10/22/12 Yes Nyoka Cowden, MD  aspirin EC 81 MG tablet Take 81 mg by mouth daily.     Yes Historical Provider, MD  clopidogrel (PLAVIX) 75 MG tablet Take 1 tablet (75 mg total) by mouth daily. 12/30/11 12/29/12 Yes Nada Libman, MD  ezetimibe (ZETIA) 10 MG tablet Take 10 mg by mouth daily.     Yes Historical Provider, MD  Fexofenadine HCl (ALLEGRA PO) Take 1 tablet by mouth 2 (two) times daily.    Yes Historical Provider, MD  HYDROcodone-acetaminophen (VICODIN) 5-500 MG per tablet Take 1 tablet by mouth 2 (two) times daily as needed. For pain   Yes Historical Provider, MD  LORazepam (ATIVAN) 0.5 MG tablet Take 1 mg by mouth 3 (three) times daily. For anxiety   Yes Historical Provider, MD  losartan (COZAAR) 50 MG tablet Take 50 mg by mouth 2 (two) times daily.     Yes Historical Provider, MD  metFORMIN (GLUCOPHAGE) 500 MG tablet Take 500 mg by mouth 2 (two) times daily with a meal.     Yes Historical Provider, MD  metoprolol succinate (TOPROL-XL) 12.5 mg TB24 Take 12.5 mg by mouth daily.     Yes Historical Provider, MD  Multiple Vitamins-Minerals (EYE VITAMINS PO) Take 1 tablet by mouth 2 (two) times daily.    Yes Historical Provider, MD  omeprazole (  PRILOSEC) 20 MG capsule Take 20 mg by mouth daily.     Yes Historical Provider, MD  rosuvastatin (CRESTOR) 40 MG tablet Take 20 mg by mouth daily.    Yes Historical Provider, MD  Tamsulosin HCl (FLOMAX) 0.4 MG CAPS Take 0.4 mg by mouth daily.    Yes Historical Provider, MD  tiotropium (SPIRIVA) 18 MCG inhalation capsule Place 18 mcg into inhaler and inhale daily.    Yes Historical Provider, MD  venlafaxine (EFFEXOR-XR) 75 MG 24 hr capsule Take 75 mg by mouth 2 (two) times daily.    Yes Historical Provider, MD  vitamin C (ASCORBIC ACID) 500 MG tablet Take 500 mg by mouth daily.     Yes Historical Provider, MD    Vitamin D, Ergocalciferol, (DRISDOL) 50000 UNITS CAPS Take 50,000 Units by mouth every 7 (seven) days. On Fridays   Yes Historical Provider, MD  zolpidem (AMBIEN) 10 MG tablet Take 10 mg by mouth at bedtime.    Yes Historical Provider, MD  methocarbamol (ROBAXIN) 500 MG tablet Take by mouth as needed. 03/16/12   Historical Provider, MD    FAMILY HISTORY: No family history on file.  SOCIAL HISTORY:  reports that he quit smoking about 10 years ago. His smoking use included Cigarettes. He does not have any smokeless tobacco history on file. He reports that he does not drink alcohol or use illicit drugs.  REVIEW OF SYSTEMS:  Constitutional:   No  weight loss, night sweats,  Fevers, chills, fatigue.  HEENT:    No headaches, Difficulty swallowing,Tooth/dental problems,Sore throat,  No sneezing, itching, ear ache, nasal congestion, post nasal drip,   Cardio-vascular: No chest pain,  Orthopnea, PND, swelling in lower extremities, anasarca,  dizziness, palpitations  GI:  No heartburn, indigestion, abdominal pain, nausea, vomiting, diarrhea, change in       bowel habits, loss of appetite  Resp: No shortness of breath with exertion or at rest.  No excess muc no productive cough, No non-productive cough,  No coughing up of blood.No change in color of mucus.No wheezing.No chest wall deformity  Skin:  no rash or lesions.  GU:  no dysuria, change in color of urine, no urgency or frequency.  No flank pain.  Musculoskeletal: No joint pain or swelling.  No decreased range of motion.  No back pain.  Psych: No change in mood or affect. No depression or anxiety.  No memory loss.   PHYSICAL EXAM: Blood pressure 176/78, pulse 100, temperature 98.2 F (36.8 C), temperature source Oral, resp. rate 23, height 5\' 10"  (1.778 m), weight 101.152 kg (223 lb), SpO2 98.00%.  General appearance :Awake, alert, not in any distress. Speech Clear. Not toxic Looking HEENT: Atraumatic and Normocephalic, pupils  equally reactive to light and accomodation Neck: supple, no JVD. No cervical lymphadenopathy.  Chest:Good air entry bilaterally, no added sounds  CVS: S1 S2 regular, no murmurs.  Abdomen: Bowel sounds present, Non tender and not distended with no gaurding, rigidity or rebound. Extremities: B/L Lower Ext shows no edema, both legs are warm to touch, with  dorsalis pedis pulses palpable. Neurology: Awake alert, and oriented X 3, Left facial droop, LUE 3/5, LLE-4/5.The right upper and lowerext 5 out of 5 strength  Skin:No Rash Wounds:N/A  LABS ON ADMISSION:   Basename 07/24/12 1621  NA 140  K 4.1  CL 105  CO2 --  GLUCOSE 126*  BUN 13  CREATININE 1.00  CALCIUM --  MG --  PHOS --   No results found for  this basename: AST:2,ALT:2,ALKPHOS:2,BILITOT:2,PROT:2,ALBUMIN:2 in the last 72 hours No results found for this basename: LIPASE:2,AMYLASE:2 in the last 72 hours  Basename 07/24/12 1621 07/24/12 1549  WBC -- 8.4  NEUTROABS -- 5.6  HGB 13.9 13.3  HCT 41.0 38.7*  MCV -- 96.0  PLT -- 213   No results found for this basename: CKTOTAL:3,CKMB:3,CKMBINDEX:3,TROPONINI:3 in the last 72 hours No results found for this basename: DDIMER:2 in the last 72 hours No components found with this basename: POCBNP:3   RADIOLOGIC STUDIES ON ADMISSION: Dg Chest 2 View  07/24/2012  *RADIOLOGY REPORT*  Clinical Data: Short of breath.  Left-sided weakness.  Previous CABG.  CHEST - 2 VIEW  Comparison: 06/04/2012  Findings: There has been previous median sternotomy and CABG. Heart size is normal.  There is chronic scarring in the region of the lingula.  No infiltrate, mass, effusion or collapse.  No pulmonary edema.  Slightly chronically prominent interstitial markings.  IMPRESSION: No active disease.  Previous CABG.  Chronic scarring.   Original Report Authenticated By: Paulina Fusi, M.D.    Ct Head Wo Contrast  07/24/2012  *RADIOLOGY REPORT*  Clinical Data: Left-sided weakness.  CT HEAD WITHOUT CONTRAST   Technique:  Contiguous axial images were obtained from the base of the skull through the vertex without contrast.  Comparison: None.  Findings: The brain shows generalized atrophy.  There are extensive chronic appearing small vessel changes throughout the hemispheric white matter.  There is a cortical/subcortical infarction in the left parietal lobe.  There are a few old lacunar infarctions in the basal ganglia.  No identifiably acute infarction, mass lesion, hemorrhage, hydrocephalus or extra-axial collection.  There is atherosclerotic calcification of the major vessels at the base of the brain.  The calvarium is unremarkable.  Sinuses, middle ears and mastoids are clear.  IMPRESSION: No identifiably acute insult.  Atrophy.  Extensive chronic small vessel disease.  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   Original Report Authenticated By: Paulina Fusi, M.D.     ASSESSMENT AND PLAN: Present on Admission:  . CVA (cerebral infarction) -Will start MRI/MRA Brain, carotid dopplers,  2 D Echo -Unfortunately patient was on ASA and Plavix as outpatient. -Will need to assess swallow screen, for now will start ASA -Await Neuro input -get PT, OT SLP eval -check Lipids, A1C  . Anxiety state, unspecified -Continue with Ativan  . COPD UNSPECIFIED -lungs clear -c/w Spiriva  . HTN (hypertension) -allow permissive HTN,  -will c/w Lopressor,hold Cozaar  . DM (diabetes mellitus) -check A1C -Hold Metformin -start SSI  . Dyslipidemia -check Lipid panel -resume Statins when oral intake resumes-after swallow eval/screen  .h/o CAD s/p CABG and PAD -resume home medications when swallow screen eval complete  Further plan will depend as patient's clinical course evolves and further radiologic and laboratory data become available. Patient will be monitored closely.   DVT Prophylaxis: Prophylactic Lovenox  Code Status: Full Code  Total time spent for admission equals 45  minutes.  [facility] Pager [phone]  If 7PM-7AM, please contact night-coverage [email or web address] Password [facility] 07/24/2012, 6:13 PM

## 2012-07-25 ENCOUNTER — Inpatient Hospital Stay (HOSPITAL_COMMUNITY): Payer: Medicare Other

## 2012-07-25 DIAGNOSIS — E119 Type 2 diabetes mellitus without complications: Secondary | ICD-10-CM

## 2012-07-25 LAB — LIPID PANEL
Cholesterol: 138 mg/dL (ref 0–200)
HDL: 28 mg/dL — ABNORMAL LOW (ref >39)
LDL Cholesterol: 34 mg/dL (ref 0–99)
Total CHOL/HDL Ratio: 4.9 RATIO
Triglycerides: 380 mg/dL — ABNORMAL HIGH (ref <150)
VLDL: 76 mg/dL — ABNORMAL HIGH (ref 0–40)

## 2012-07-25 MED ORDER — ROSUVASTATIN CALCIUM 40 MG PO TABS
40.0000 mg | ORAL_TABLET | Freq: Every day | ORAL | Status: DC
  Administered 2012-07-25 – 2012-07-27 (×3): 40 mg via ORAL
  Filled 2012-07-25 (×3): qty 1

## 2012-07-25 MED ORDER — ALBUTEROL SULFATE (5 MG/ML) 0.5% IN NEBU
2.5000 mg | INHALATION_SOLUTION | Freq: Four times a day (QID) | RESPIRATORY_TRACT | Status: DC
  Administered 2012-07-25 – 2012-07-26 (×5): 2.5 mg via RESPIRATORY_TRACT
  Filled 2012-07-25 (×6): qty 0.5

## 2012-07-25 MED ORDER — IPRATROPIUM BROMIDE 0.02 % IN SOLN
0.5000 mg | Freq: Four times a day (QID) | RESPIRATORY_TRACT | Status: DC
  Administered 2012-07-25 – 2012-07-26 (×5): 0.5 mg via RESPIRATORY_TRACT
  Filled 2012-07-25 (×6): qty 2.5

## 2012-07-25 NOTE — Progress Notes (Signed)
VASCULAR LAB PRELIMINARY  PRELIMINARY  PRELIMINARY  PRELIMINARY  Carotid Dopplers completed.    Preliminary report:  There is no significant right ICA stenosis. The left ICA appears occluded.  Bilateral vertebral artery flow is antegrade.  Ronald Frank, 07/25/2012, 11:44 AM

## 2012-07-25 NOTE — Progress Notes (Signed)
Utilization review completed.  

## 2012-07-25 NOTE — Progress Notes (Signed)
Stroke Team Progress Note  HISTORY Ronald Frank is a (RH) 77 y.o. male who reports noting at about 3AM that he was unable to use his left arm well. The patient reports that he was unable to sleep because he did not fel well. Went to reach for something and noted that his left arm was weak. He waited to see if the weakness would resolve on its own but it did not. His daughter called EMS and the patient was brought in for evaluation.  Patient on ASA and Plavix at home.  LSN: [id number]  tPA Given: No: Outside time window    He was admitted to the neuro unitfor further evaluation and treatment.  SUBJECTIVE   Overall he feels his condition is gradually improving. His speech is normal and face and hand weakness has improved but not to baseline yet.he was compliant with his medicines and denies any prior h/o strokes or TIAs.  OBJECTIVE Most recent Vital Signs: Filed Vitals:   07/25/12 0400 07/25/12 0600 07/25/12 0916 07/25/12 1000  BP: 162/65 138/54  149/60  Pulse: 109 101  103  Temp: 98 F (36.7 C) 97.9 F (36.6 C)  98 F (36.7 C)  TempSrc: Oral Oral  Oral  Resp: 20 20  20   Height:      Weight:      SpO2: 96% 97% 94% 93%   CBG (last 3)   Basename 07/25/12 0708 07/24/12 2241  GLUCAP 107* 115*    IV Fluid Intake:     . [EXPIRED] sodium chloride 50 mL/hr at 07/24/12 2140  . [DISCONTINUED] sodium chloride 125 mL/hr at 07/24/12 1708    MEDICATIONS    . ipratropium  0.5 mg Nebulization Q6H   And  . albuterol  2.5 mg Nebulization Q6H  . atorvastatin  40 mg Oral q1800  . clopidogrel  75 mg Oral Q breakfast  . enoxaparin  40 mg Subcutaneous Q24H  . ezetimibe  10 mg Oral Daily  . insulin aspart  0-9 Units Subcutaneous TID WC  . LORazepam  1 mg Oral TID  . metoprolol succinate  12.5 mg Oral Daily  . pantoprazole  40 mg Oral Daily  . Tamsulosin HCl  0.4 mg Oral Daily  . venlafaxine XR  150 mg Oral QHS  . zolpidem  10 mg Oral QHS  . [DISCONTINUED] aspirin  300 mg Rectal Daily  .  [DISCONTINUED] aspirin  325 mg Oral Daily  . [DISCONTINUED] clopidogrel  0.02 mg Oral Q breakfast  . [DISCONTINUED] clopidogrel  75 mg Oral Daily  . [DISCONTINUED] tiotropium  18 mcg Inhalation Daily  . [DISCONTINUED] venlafaxine XR  75 mg Oral BID   PRN:  hydrALAZINE, ondansetron (ZOFRAN) IV, senna-docusate  Diet:  Carb Control  Activity:  Bedrest  DVT Prophylaxis: SCDs  CLINICALLY SIGNIFICANT STUDIES Basic Metabolic Panel:  Lab 07/24/12 1610 07/24/12 1621  NA -- 140  K -- 4.1  CL -- 105  CO2 -- --  GLUCOSE -- 126*  BUN -- 13  CREATININE 0.95 1.00  CALCIUM -- --  MG -- --  PHOS -- --   Liver Function Tests: No results found for this basename: AST:2,ALT:2,ALKPHOS:2,BILITOT:2,PROT:2,ALBUMIN:2 in the last 168 hours CBC:  Lab 07/24/12 2143 07/24/12 1621 07/24/12 1549  WBC 8.6 -- 8.4  NEUTROABS -- -- 5.6  HGB 12.7* 13.9 --  HCT 36.9* 41.0 --  MCV 96.3 -- 96.0  PLT 211 -- 213   Coagulation:  Lab 07/24/12 1549  LABPROT 11.9  INR 0.88  Cardiac Enzymes: No results found for this basename: CKTOTAL:3,CKMB:3,CKMBINDEX:3,TROPONINI:3 in the last 168 hours Urinalysis: No results found for this basename: COLORURINE:2,APPERANCEUR:2,LABSPEC:2,PHURINE:2,GLUCOSEU:2,HGBUR:2,BILIRUBINUR:2,KETONESUR:2,PROTEINUR:2,UROBILINOGEN:2,NITRITE:2,LEUKOCYTESUR:2 in the last 168 hours Lipid Panel    Component Value Date/Time   CHOL 138 07/25/2012 0530   TRIG 380* 07/25/2012 0530   HDL 28* 07/25/2012 0530   CHOLHDL 4.9 07/25/2012 0530   VLDL 76* 07/25/2012 0530   LDLCALC 34 07/25/2012 0530   HgbA1C  No results found for this basename: HGBA1C    Urine Drug Screen:   No results found for this basename: labopia, cocainscrnur, labbenz, amphetmu, thcu, labbarb    Alcohol Level: No results found for this basename: ETH:2 in the last 168 hours  Dg Chest 2 View  07/24/2012  *RADIOLOGY REPORT*  Clinical Data: Short of breath.  Left-sided weakness.  Previous CABG.  CHEST - 2 VIEW  Comparison:  06/04/2012  Findings: There has been previous median sternotomy and CABG. Heart size is normal.  There is chronic scarring in the region of the lingula.  No infiltrate, mass, effusion or collapse.  No pulmonary edema.  Slightly chronically prominent interstitial markings.  IMPRESSION: No active disease.  Previous CABG.  Chronic scarring.   Original Report Authenticated By: Paulina Fusi, M.D.    Ct Head Wo Contrast  07/24/2012  *RADIOLOGY REPORT*  Clinical Data: Left-sided weakness.  CT HEAD WITHOUT CONTRAST  Technique:  Contiguous axial images were obtained from the base of the skull through the vertex without contrast.  Comparison: None.  Findings: The brain shows generalized atrophy.  There are extensive chronic appearing small vessel changes throughout the hemispheric white matter.  There is a cortical/subcortical infarction in the left parietal lobe.  There are a few old lacunar infarctions in the basal ganglia.  No identifiably acute infarction, mass lesion, hemorrhage, hydrocephalus or extra-axial collection.  There is atherosclerotic calcification of the major vessels at the base of the brain.  The calvarium is unremarkable.  Sinuses, middle ears and mastoids are clear.  IMPRESSION: No identifiably acute insult.  Atrophy.  Extensive chronic small vessel disease.  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   Original Report Authenticated By: Paulina Fusi, M.D.     CT of the brain  No acute abnormality  MRI of the brain  ordered  MRA of the brain  ordered  2D Echocardiogram  ordered  Carotid Doppler  ordered  CXR    EKG SINUS TACHYCARDIA ~ V-rate> 99 RIGHT BUNDLE BRANCH BLOCK ~ QRSd>120, terminal axis(90,270)  Therapy Recommendations ordered Physical Exam  Pleasant elderly caucasian male not in distress.Awake alert. Afebrile. Head is nontraumatic. Neck is supple without bruit. Hearing is normal. Cardiac exam no murmur or gallop. Lungs are clear to auscultation. Distal pulses are  well felt.  Neurological Exam ; Awake alert oriented x 3 normal speech and language.No dysarthria.extraocular movements are full range. Fundi not visualized. Vision acuity and fields appear normal.. Mild left lower face asymmetry. Tongue midline. Mild LUE drift. Mild diminished fine finger movements on left. Orbits right over left upper extremity. Mild left grip weak.. Normal sensation . Normal coordination. Gait not tested. ASSESSMENT [name] is a 77 y.o. male presenting with  dysarthria, left face and upper extremity weakness. Suspect right brain subcortical infarct .MRI Imaging pending*.  Work up underway. On aspirin 81 mg orally every day plus Plavix 75 mg prior to admission. Now on Plavix mg orally every day for secondary stroke prevention. Patient with resultant  Mild left face and hand weakness.  Vascular risk factors : DM, Lipids, CAD, PAD, HT  Hospital day # 1  TREATMENT/PLAN  Continue clopidogrel 75 mg orally every day for secondary stroke prevention.No need for aspirin in addition unless needed from PAD standpoint.  Follow MRI, Echo and dopplers  Mobilize out of bed. PT/Ot consults  D/W patient and Dr Sharl Ma  Stroke team will follow.  Delia Heady, MD Medical Director [facility] Pager: [phone] 07/25/2012 11:10 AM

## 2012-07-25 NOTE — Progress Notes (Signed)
Subjective: Patient seen and examined, admitted with left-sided weakness CT of the head was negative. MRIof the brain is pending at this time.patient continues to have left upper extremity weakness.today patient complains of shortness of breath he does have a history of COPD  Objective: Vital signs in last 24 hours: Temp:  [97.9 F (36.6 C)-98.3 F (36.8 C)] 97.9 F (36.6 C) (11/10 0600) Pulse Rate:  [100-109] 101  (11/10 0600) Resp:  [18-28] 20  (11/10 0600) BP: (136-191)/(54-165) 138/54 mmHg (11/10 0600) SpO2:  [94 %-100 %] 94 % (11/10 0916) Weight:  [101.152 kg (223 lb)-102.059 kg (225 lb)] 102.059 kg (225 lb) (11/09 2120) Weight change:  Last BM Date: 07/24/12  Consults: neurology  Procedures: none  Intake/Output from previous day: 11/09 0701 - 11/10 0700 In: 816.7 [P.O.:400; I.V.:416.7] Out: 850 [Urine:850]     Physical Exam: Head: Normocephalic, atraumatic.  Eyes: No signs of jaundice, EOMI Nose: Mucous membranes dry.  Throat: Oropharynx nonerythematous, no exudate appreciated.  Neck: supple,No deformities, masses, or tenderness noted. Lungs: decreased breath sounds bilaterally Heart: Regular RR. S1 and S2 normal  Abdomen: BS normoactive. Soft, Nondistended, non-tender.  Extremities: No pretibial edema, no erythema Neurological: Alert oriented x3, left upper extremity weakness  Lab Results: Basic Metabolic Panel:  Basename 07/24/12 2143 07/24/12 1621  NA -- 140  K -- 4.1  CL -- 105  CO2 -- --  GLUCOSE -- 126*  BUN -- 13  CREATININE 0.95 1.00  CALCIUM -- --  MG -- --  PHOS -- --     Basename 07/24/12 2143 07/24/12 1621 07/24/12 1549  WBC 8.6 -- 8.4  NEUTROABS -- -- 5.6  HGB 12.7* 13.9 --  HCT 36.9* 41.0 --  MCV 96.3 -- 96.0  PLT 211 -- 213   CBG:  Basename 07/25/12 0708 07/24/12 2241  GLUCAP 107* 115*   Hemoglobin A1C: No results found for this basename: HGBA1C in the last 72 hours Fasting Lipid Panel:  Basename 07/25/12 0530  CHOL 138   HDL 28*  LDLCALC 34  TRIG 161*  CHOLHDL 4.9  LDLDIRECT --   Coagulation:  Basename 07/24/12 1549  LABPROT 11.9  INR 0.88   Urine Drug Screen: Drugs of Abuse  No results found for this basename: labopia, cocainscrnur, labbenz, amphetmu, thcu, labbarb    Alcohol Level: No results found for this basename: ETH:2 in the last 72 hours Urinalysis: No results found for this basename: COLORURINE:2,APPERANCEUR:2,LABSPEC:2,PHURINE:2,GLUCOSEU:2,HGBUR:2,BILIRUBINUR:2,KETONESUR:2,PROTEINUR:2,UROBILINOGEN:2,NITRITE:2,LEUKOCYTESUR:2 in the last 72 hours  No results found for this or any previous visit (from the past 240 hour(s)).  Studies/Results: Dg Chest 2 View  07/24/2012  *RADIOLOGY REPORT*  Clinical Data: Short of breath.  Left-sided weakness.  Previous CABG.  CHEST - 2 VIEW  Comparison: 06/04/2012  Findings: There has been previous median sternotomy and CABG. Heart size is normal.  There is chronic scarring in the region of the lingula.  No infiltrate, mass, effusion or collapse.  No pulmonary edema.  Slightly chronically prominent interstitial markings.  IMPRESSION: No active disease.  Previous CABG.  Chronic scarring.   Original Report Authenticated By: Paulina Fusi, M.D.    Ct Head Wo Contrast  07/24/2012  *RADIOLOGY REPORT*  Clinical Data: Left-sided weakness.  CT HEAD WITHOUT CONTRAST  Technique:  Contiguous axial images were obtained from the base of the skull through the vertex without contrast.  Comparison: None.  Findings: The brain shows generalized atrophy.  There are extensive chronic appearing small vessel changes throughout the hemispheric white matter.  There is a cortical/subcortical  infarction in the left parietal lobe.  There are a few old lacunar infarctions in the basal ganglia.  No identifiably acute infarction, mass lesion, hemorrhage, hydrocephalus or extra-axial collection.  There is atherosclerotic calcification of the major vessels at the base of the brain.  The calvarium  is unremarkable.  Sinuses, middle ears and mastoids are clear.  IMPRESSION: No identifiably acute insult.  Atrophy.  Extensive chronic small vessel disease.  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   Original Report Authenticated By: Paulina Fusi, M.D.     Medications: Scheduled Meds:   . ipratropium  0.5 mg Nebulization Q6H   And  . albuterol  2.5 mg Nebulization Q6H  . aspirin  300 mg Rectal Daily   Or  . aspirin  325 mg Oral Daily  . atorvastatin  40 mg Oral q1800  . clopidogrel  75 mg Oral Q breakfast  . enoxaparin  40 mg Subcutaneous Q24H  . ezetimibe  10 mg Oral Daily  . insulin aspart  0-9 Units Subcutaneous TID WC  . LORazepam  1 mg Oral TID  . metoprolol succinate  12.5 mg Oral Daily  . pantoprazole  40 mg Oral Daily  . Tamsulosin HCl  0.4 mg Oral Daily  . venlafaxine XR  150 mg Oral QHS  . zolpidem  10 mg Oral QHS  . [DISCONTINUED] clopidogrel  0.02 mg Oral Q breakfast  . [DISCONTINUED] clopidogrel  75 mg Oral Daily  . [DISCONTINUED] tiotropium  18 mcg Inhalation Daily  . [DISCONTINUED] venlafaxine XR  75 mg Oral BID   Continuous Infusions:   . [EXPIRED] sodium chloride 50 mL/hr at 07/24/12 2140  . [DISCONTINUED] sodium chloride 125 mL/hr at 07/24/12 1708   PRN Meds:.hydrALAZINE, ondansetron (ZOFRAN) IV, senna-docusate    Active Problems:  Anxiety state, unspecified  COPD UNSPECIFIED  Peripheral vascular disease, unspecified  CVA (cerebral infarction)  HTN (hypertension)  DM (diabetes mellitus)  Dyslipidemia  CAD (coronary artery disease)  Hemiplegia, unspecified, affecting nondominant side  CVA (cerebral infarction)  - MRI/MRA Brain, carotid dopplers, 2 D Echo  -Unfortunately patient was on ASA and Plavix as outpatient.  -Will need to assess swallow screen, for now will start ASA  -Await Neuro input  -get PT, OT SLP eval  -check Lipids, A1C   . Anxiety state, unspecified  -Continue with Ativan   . COPD UNSPECIFIED  Will discontinue  Spiriva at this time and start him on DuoNeb nebulizers every 6 hours  . HTN (hypertension)  -allow permissive HTN,  -will c/w Lopressor,hold Cozaar   . DM (diabetes mellitus)  -check A1C  -Hold Metformin  -start SSI   . Dyslipidemia  -check Lipid panel   .h/o CAD s/p CABG and PAD  stable   LOS: 1 day   [facility] Pager: [phone] 07/25/2012, 9:30 AM

## 2012-07-25 NOTE — Evaluation (Signed)
Physical Therapy Evaluation Patient Details Name: Ronald Frank MRN: [id number] DOB: September 03, 1935 Today's Date: 07/25/2012 Time: 4010-2725 PT Time Calculation (min): 28 min  PT Assessment / Plan / Recommendation Clinical Impression  Pt admitted with LUE weakness and slurred. Speech. Pt found to have Numerous discrete infarctions scattered throughout the right middle cerebral artery territory affecting the basal ganglia and frontoparietal brain. Punctate acute infarctions in the right thalamus in the left basal ganglia. Pt is at his baseline functional level for gait and balance. No acute PT needs, will not follow. Please reorder if necessary.    PT Assessment  Patent does not need any further PT services    Follow Up Recommendations  No PT follow up;Supervision - Intermittent          Equipment Recommendations  None recommended by PT          Precautions / Restrictions Precautions Precautions: None Restrictions Weight Bearing Restrictions: No   Pertinent Vitals/Pain No pain noted      Mobility  Bed Mobility Bed Mobility: Supine to Sit;Sitting - Scoot to Edge of Bed;Sit to Supine Supine to Sit: 6: Modified independent (Device/Increase time) Sitting - Scoot to Edge of Bed: 6: Modified independent (Device/Increase time) Sit to Supine: 6: Modified independent (Device/Increase time) Details for Bed Mobility Assistance: NO physical assist needed. Increased time Transfers Transfers: Sit to Stand;Stand to Sit Sit to Stand: 5: Supervision;With upper extremity assist;From bed Stand to Sit: 5: Supervision;With upper extremity assist;To bed Details for Transfer Assistance: Supervision for safety  Ambulation/Gait Ambulation/Gait Assistance: 4: Min guard Ambulation Distance (Feet): 75 Feet Assistive device: None Ambulation/Gait Assistance Details: Minguard for safety. Pt with no loss of balance or gait impairments. Increased dyspnea in which pt stated is normal secondary to hsi  COPD Gait Pattern: Within Functional Limits Gait velocity: normal gait speed Stairs: No (unable secondary to IV) Modified Rankin (Stroke Patients Only) Pre-Morbid Rankin Score: No symptoms Modified Rankin: Moderate disability      Visit Information  Last PT Received On: 07/25/12 Assistance Needed: +1    Subjective Data  Patient Stated Goal: to be able to use arm again   Prior Functioning  Home Living Lives With: Spouse Available Help at Discharge: Family;Available 24 hours/day Type of Home: House Home Access: Stairs to enter [employer] of Steps: 5 Entrance Stairs-Rails: Right Home Layout: One level Bathroom Shower/Tub: Tub/shower unit;Curtain Bathroom Toilet: Handicapped height Bathroom Accessibility: Yes How Accessible: Accessible via walker Home Adaptive Equipment: Walker - rolling;Straight cane;Raised toilet seat with rails Prior Function Level of Independence: Independent Able to Take Stairs?: Yes Driving: Yes Vocation: Retired Musician: No difficulties Dominant Hand: Right    Cognition  Overall Cognitive Status: Appears within functional limits for tasks assessed/performed Arousal/Alertness: Awake/alert Orientation Level: Appears intact for tasks assessed Behavior During Session: [facility] for tasks performed    Extremity/Trunk Assessment Right Lower Extremity Assessment RLE ROM/Strength/Tone: Within functional levels RLE Sensation: WFL - Light Touch Left Lower Extremity Assessment LLE ROM/Strength/Tone: Within functional levels LLE Sensation: WFL - Light Touch   Balance Balance Balance Assessed: Yes (during gait, no deficits noted) Static Standing Balance Static Standing - Balance Support: Left upper extremity supported;During functional activity Static Standing - Level of Assistance: 5: Stand by assistance Static Standing - Comment/# of Minutes: pt stood at toilet using bathroom for ~3 minutes with SBA only.   End of Session PT  - End of Session Equipment Utilized During Treatment: Gait belt Activity Tolerance: Patient tolerated treatment well Patient left: in bed;with  call bell/phone within reach;with family/visitor present;with bed alarm set Nurse Communication: Mobility status   Milana Kidney 07/25/2012, 5:05 PM  07/25/2012 Milana Kidney DPT PAGER: [phone] OFFICE: [phone]

## 2012-07-26 DIAGNOSIS — E785 Hyperlipidemia, unspecified: Secondary | ICD-10-CM

## 2012-07-26 DIAGNOSIS — I10 Essential (primary) hypertension: Secondary | ICD-10-CM

## 2012-07-26 DIAGNOSIS — I739 Peripheral vascular disease, unspecified: Secondary | ICD-10-CM

## 2012-07-26 LAB — GLUCOSE, CAPILLARY
Glucose-Capillary: 105 mg/dL — ABNORMAL HIGH (ref 70–99)
Glucose-Capillary: 128 mg/dL — ABNORMAL HIGH (ref 70–99)
Glucose-Capillary: 150 mg/dL — ABNORMAL HIGH (ref 70–99)

## 2012-07-26 NOTE — Progress Notes (Signed)
Stroke Team Progress Note  HISTORY CYPRUS KUANG is a (RH) 77 y.o. male who reports noting at about 3AM on 07/24/12 that he was unable to use his left arm well. The patient reports that he was unable to sleep because he did not fel well. Went to reach for something and noted that his left arm was weak. He waited to see if the weakness would resolve on its own but it did not. His daughter called EMS and the patient was brought in for evaluation. Patient on ASA and Plavix at home.   LSN: [id number] on 07/24/12 tPA Given: No: Outside time window    He was admitted to the neuro unit for further evaluation and treatment.  SUBJECTIVE  Feels like his speech is better. Sitting with therapist. No headache.  OBJECTIVE Most recent Vital Signs: Filed Vitals:   07/25/12 2235 07/26/12 0249 07/26/12 0300 07/26/12 0500  BP: 127/42  188/88 166/89  Pulse: 78  85 111  Temp: 98 F (36.7 C)  97.2 F (36.2 C) 98.6 F (37 C)  TempSrc: Oral  Oral Oral  Resp: 18 15 22 20   Height:      Weight:      SpO2: 92%  99% 93%   CBG (last 3)   Basename 07/26/12 0727 07/25/12 2243 07/25/12 1645  GLUCAP 113* 105* 149*    IV Fluid Intake:     MEDICATIONS     . ipratropium  0.5 mg Nebulization Q6H   And  . albuterol  2.5 mg Nebulization Q6H  . clopidogrel  75 mg Oral Q breakfast  . enoxaparin  40 mg Subcutaneous Q24H  . ezetimibe  10 mg Oral Daily  . insulin aspart  0-9 Units Subcutaneous TID WC  . LORazepam  1 mg Oral TID  . metoprolol succinate  12.5 mg Oral Daily  . pantoprazole  40 mg Oral Daily  . rosuvastatin  40 mg Oral q1800  . Tamsulosin HCl  0.4 mg Oral Daily  . venlafaxine XR  150 mg Oral QHS  . zolpidem  10 mg Oral QHS  . [DISCONTINUED] aspirin  300 mg Rectal Daily  . [DISCONTINUED] aspirin  325 mg Oral Daily  . [DISCONTINUED] atorvastatin  40 mg Oral q1800   PRN:  hydrALAZINE, ondansetron (ZOFRAN) IV, senna-docusate  Diet:  Carb Control  Activity:  Up as tolerated DVT Prophylaxis:  lovenox  CLINICALLY SIGNIFICANT STUDIES Basic Metabolic Panel:   Lab 07/24/12 2143 07/24/12 1621  NA -- 140  K -- 4.1  CL -- 105  CO2 -- --  GLUCOSE -- 126*  BUN -- 13  CREATININE 0.95 1.00  CALCIUM -- --  MG -- --  PHOS -- --    CBC:   Lab 07/24/12 2143 07/24/12 1621 07/24/12 1549  WBC 8.6 -- 8.4  NEUTROABS -- -- 5.6  HGB 12.7* 13.9 --  HCT 36.9* 41.0 --  MCV 96.3 -- 96.0  PLT 211 -- 213   Coagulation:   Lab 07/24/12 1549  LABPROT 11.9  INR 0.88    Lipid Panel    Component Value Date/Time   CHOL 138 07/25/2012 0530   TRIG 380* 07/25/2012 0530   HDL 28* 07/25/2012 0530   CHOLHDL 4.9 07/25/2012 0530   VLDL 76* 07/25/2012 0530   LDLCALC 34 07/25/2012 0530   HgbA1C  Lab Results  Component Value Date   HGBA1C 6.4* 07/25/2012   Dg Chest 2 View 07/24/2012   No active disease.  Previous CABG.  Chronic scarring.   Marland Kitchen  Ct Head Wo Contrast 07/24/2012 No identifiably acute insult.  Atrophy.  Extensive chronic small vessel disease.  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     Mr Brain Wo Contrast 07/25/2012  MRI HEAD  Numerous discrete infarctions scattered throughout the right middle cerebral artery territory affecting the basal ganglia and frontoparietal brain.  Punctate acute infarctions in the right thalamus in the left basal ganglia. Multiple vascular territories are involved.   MRA HEAD   Considerable degradation by motion artifact.  No antegrade flow in the left internal carotid artery.  The right internal carotid artery is patent to the brain and supplies the entire anterior circulation as well as giving contributions to the posterior circulation by a large posterior communicating artery/fetal origin of the right PCA.  Because of this vascular anatomy, all of the acute infarctions could be caused by embolic disease within the right internal carotid system.    CT of the brain  No acute abnormality  2D Echocardiogram    TEE  Carotid Doppler   There is no significant right ICA stenosis. The left ICA appears occluded. Bilateral vertebral artery flow is antegrade.  EKG SINUS TACHYCARDIA ~ V-rate> 99 RIGHT BUNDLE BRANCH BLOCK ~ QRSd>120, terminal axis(90,270)  Therapy Recommendations outpatient  Physical Exam  Pleasant elderly caucasian male not in distress.Awake alert. Afebrile. Head is nontraumatic. Neck is supple without bruit. Hearing is normal. Cardiac exam no murmur or gallop. Lungs are clear to auscultation. Distal pulses are well felt.  Neurological Exam ; Awake alert oriented x 3 normal speech and language.No dysarthria.extraocular movements are full range. Fundi not visualized. Vision acuity and fields appear normal.. Mild left lower face asymmetry. Tongue midline. Mild LUE drift. Mild diminished fine finger movements on left. Orbits right over left upper extremity. Mild left grip weak.. Normal sensation . Normal coordination. Gait not tested.   ASSESSMENT Mr. ISHAM WICKMAN is a 77 y.o. male presenting with  dysarthria, left face and upper extremity weakness. Suspect right brain subcortical infarct .MRI Imaging pending*.  Work up underway. On aspirin 81 mg orally every day plus Plavix 75 mg prior to admission. Now on Plavix mg orally plus baby aspirin every day for secondary stroke prevention. Patient with resultant  Mild left face and hand weakness.  Punctate acute infarctions in the right thalamus in the left basal ganglia  Diabetes mellitus (HGB A1C goal < 7.0)  Coronary artery disease  peripheral artery disease  Hypertension (goal 130/80)  Hyperlipidemia (LDL goal < 70)  Hospital day # 2  TREATMENT/PLAN  Continue clopidogrel 75 mg and baby aspirin orally every day for secondary stroke prevention/cardiac disease.  TEE to look for embolic source arranged for am 07/27/12. Will need to be NPO after midnight. If positive for PFO (patent foramen ovale), check bilateral lower extremity venous dopplers to rule out DVT  as possible source of stroke.  Outpatient Cardiac monitor arranged, the cardiologist will contact patient. Risk factor management  Guy Franco, [facility],  MBA, MHA [facility] Pager: [phone] 07/26/2012 3:43 PM  Scribe for Dr. Delia Heady, Stroke Center Medical Director. He has personally reviewed chart, pertinent data, examined the patient and developed the plan of care. Pager:  [phone]

## 2012-07-26 NOTE — Progress Notes (Signed)
Subjective: Patient seen and examined, admitted with left-sided weakness CT of the head was negative. MRIof the brain shows numerous discrete infarctions scattered throughout the right middle cerebral artery territory affecting  and frontoparietal brain. Punctate acute infarctions in the right thalamus in the left basal ganglia . patient continues to have left upper extremity weakness. Objective: Vital signs in last 24 hours: Temp:  [97.2 F (36.2 C)-98.6 F (37 C)] 98.2 F (36.8 C) (11/11 1047) Pulse Rate:  [78-111] 98  (11/11 1047) Resp:  [15-22] 18  (11/11 1047) BP: (127-188)/(42-89) 172/86 mmHg (11/11 1047) SpO2:  [92 %-100 %] 95 % (11/11 1047) Weight change:  Last BM Date: 07/24/12  Consults: neurology  Procedures: none  Intake/Output from previous day: 11/10 0701 - 11/11 0700 In: 720 [P.O.:720] Out: 250 [Urine:250]     Physical Exam: Head: Normocephalic, atraumatic.  Eyes: No signs of jaundice, EOMI Nose: Mucous membranes dry.  Neck: supple,No deformities, masses, or tenderness noted. Lungs: decreased breath sounds bilaterally Heart: Regular RR. S1 and S2 normal  Abdomen: BS normoactive. Soft, Nondistended, non-tender.  Extremities: No pretibial edema, no erythema Neurological: Alert oriented x3, left upper extremity weakness  Lab Results: Basic Metabolic Panel:  Basename 07/24/12 2143 07/24/12 1621  NA -- 140  K -- 4.1  CL -- 105  CO2 -- --  GLUCOSE -- 126*  BUN -- 13  CREATININE 0.95 1.00  CALCIUM -- --  MG -- --  PHOS -- --     Basename 07/24/12 2143 07/24/12 1621 07/24/12 1549  WBC 8.6 -- 8.4  NEUTROABS -- -- 5.6  HGB 12.7* 13.9 --  HCT 36.9* 41.0 --  MCV 96.3 -- 96.0  PLT 211 -- 213   CBG:  Basename 07/26/12 1150 07/26/12 0727 07/25/12 2243 07/25/12 1645 07/25/12 1145 07/25/12 0708  GLUCAP 128* 113* 105* 149* 137* 107*   Hemoglobin A1C:  Basename 07/25/12 0530  HGBA1C 6.4*   Fasting Lipid Panel:  Basename 07/25/12 0530  CHOL 138   HDL 28*  LDLCALC 34  TRIG 469*  CHOLHDL 4.9  LDLDIRECT --   Coagulation:  Basename 07/24/12 1549  LABPROT 11.9  INR 0.88   Urine Drug Screen: Drugs of Abuse  No results found for this basename: labopia,  cocainscrnur,  labbenz,  amphetmu,  thcu,  labbarb    Alcohol Level: No results found for this basename: ETH:2 in the last 72 hours Urinalysis: No results found for this basename: COLORURINE:2,APPERANCEUR:2,LABSPEC:2,PHURINE:2,GLUCOSEU:2,HGBUR:2,BILIRUBINUR:2,KETONESUR:2,PROTEINUR:2,UROBILINOGEN:2,NITRITE:2,LEUKOCYTESUR:2 in the last 72 hours  No results found for this or any previous visit (from the past 240 hour(s)).  Studies/Results: Dg Chest 2 View  07/24/2012  *RADIOLOGY REPORT*  Clinical Data: Short of breath.  Left-sided weakness.  Previous CABG.  CHEST - 2 VIEW  Comparison: 06/04/2012  Findings: There has been previous median sternotomy and CABG. Heart size is normal.  There is chronic scarring in the region of the lingula.  No infiltrate, mass, effusion or collapse.  No pulmonary edema.  Slightly chronically prominent interstitial markings.  IMPRESSION: No active disease.  Previous CABG.  Chronic scarring.   Original Report Authenticated By: Paulina Fusi, M.D.    Ct Head Wo Contrast  07/24/2012  *RADIOLOGY REPORT*  Clinical Data: Left-sided weakness.  CT HEAD WITHOUT CONTRAST  Technique:  Contiguous axial images were obtained from the base of the skull through the vertex without contrast.  Comparison: None.  Findings: The brain shows generalized atrophy.  There are extensive chronic appearing small vessel changes throughout the hemispheric white matter.  There  is a cortical/subcortical infarction in the left parietal lobe.  There are a few old lacunar infarctions in the basal ganglia.  No identifiably acute infarction, mass lesion, hemorrhage, hydrocephalus or extra-axial collection.  There is atherosclerotic calcification of the major vessels at the base of the brain.  The  calvarium is unremarkable.  Sinuses, middle ears and mastoids are clear.  IMPRESSION: No identifiably acute insult.  Atrophy.  Extensive chronic small vessel disease.  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   Original Report Authenticated By: Paulina Fusi, M.D.    Mr Brain Wo Contrast  07/25/2012  *RADIOLOGY REPORT*  Clinical Data:  Stroke  MRI HEAD WITHOUT CONTRAST MRA HEAD WITHOUT CONTRAST  Technique:  Multiplanar, multiecho pulse sequences of the brain and surrounding structures were obtained without intravenous contrast. Angiographic images of the head were obtained using MRA technique without contrast.  Comparison:  Head CT 11/09  MRI HEAD  Findings:  Diffusion imaging shows multiple scattered infarctions throughout the right middle cerebral artery territory affecting the basal ganglia, posterior frontal and parietal brain.  The most extensive involvement is at the frontoparietal junction.  There is also a punctate infarction within the right thalamus and within the left basal ganglia.  Multiple vascular distributions involved.  There are mild chronic small vessel changes of the pons.  There are moderate chronic small vessel changes within the hemispheric white matter.  There is an old left parietal cortical and subcortical infarction.  No mass lesion, hemorrhage, hydrocephalus or extra- axial collection.  No pituitary mass.  No inflammatory sinus disease.  No skull or skull base lesion.  IMPRESSION: Numerous discrete infarctions scattered throughout the right middle cerebral artery territory affecting the basal ganglia and frontoparietal brain.  Punctate acute infarctions in the right thalamus in the left basal ganglia. Multiple vascular territories are involved.  MRA HEAD  Findings: There is no antegrade flow in the left internal carotid artery.  The right internal carotid arteries patent but shows atherosclerotic irregularity in the siphon region.  There is some motion artifact in that  area.  The supraclinoid internal carotid artery on the right is widely patent.  Flow from this vessel supplies the right middle cerebral artery territory, both anterior cerebral artery territories and the left middle cerebral artery territory through a patent anterior communicating artery.  There is diminished filling of the left MCA branches as would be expected.  Both vertebral arteries are patent to the basilar.  Detail is poor because of motion degradation.  The basilar artery is patent. There is probably a stenosis at the right vertebral basilar junction.  There is flow within both posterior cerebral arteries. Considerable stenotic disease effects these vessels.  There are patent posterior communicating arteries and or fetal origins.  IMPRESSION: Considerable degradation by motion artifact.  No antegrade flow in the left internal carotid artery.  The right internal carotid artery is patent to the brain and supplies the entire anterior circulation as well as giving contributions to the posterior circulation by a large posterior communicating artery/fetal origin of the right PCA.  Because of this vascular anatomy, all of the acute infarctions could be caused by embolic disease within the right internal carotid system.   Original Report Authenticated By: Paulina Fusi, M.D.    Mr Mra Head/brain Wo Cm  07/25/2012  *RADIOLOGY REPORT*  Clinical Data:  Stroke  MRI HEAD WITHOUT CONTRAST MRA HEAD WITHOUT CONTRAST  Technique:  Multiplanar, multiecho pulse sequences of the brain and surrounding structures were obtained without  intravenous contrast. Angiographic images of the head were obtained using MRA technique without contrast.  Comparison:  Head CT 11/09  MRI HEAD  Findings:  Diffusion imaging shows multiple scattered infarctions throughout the right middle cerebral artery territory affecting the basal ganglia, posterior frontal and parietal brain.  The most extensive involvement is at the frontoparietal junction.   There is also a punctate infarction within the right thalamus and within the left basal ganglia.  Multiple vascular distributions involved.  There are mild chronic small vessel changes of the pons.  There are moderate chronic small vessel changes within the hemispheric white matter.  There is an old left parietal cortical and subcortical infarction.  No mass lesion, hemorrhage, hydrocephalus or extra- axial collection.  No pituitary mass.  No inflammatory sinus disease.  No skull or skull base lesion.  IMPRESSION: Numerous discrete infarctions scattered throughout the right middle cerebral artery territory affecting the basal ganglia and frontoparietal brain.  Punctate acute infarctions in the right thalamus in the left basal ganglia. Multiple vascular territories are involved.  MRA HEAD  Findings: There is no antegrade flow in the left internal carotid artery.  The right internal carotid arteries patent but shows atherosclerotic irregularity in the siphon region.  There is some motion artifact in that area.  The supraclinoid internal carotid artery on the right is widely patent.  Flow from this vessel supplies the right middle cerebral artery territory, both anterior cerebral artery territories and the left middle cerebral artery territory through a patent anterior communicating artery.  There is diminished filling of the left MCA branches as would be expected.  Both vertebral arteries are patent to the basilar.  Detail is poor because of motion degradation.  The basilar artery is patent. There is probably a stenosis at the right vertebral basilar junction.  There is flow within both posterior cerebral arteries. Considerable stenotic disease effects these vessels.  There are patent posterior communicating arteries and or fetal origins.  IMPRESSION: Considerable degradation by motion artifact.  No antegrade flow in the left internal carotid artery.  The right internal carotid artery is patent to the brain and  supplies the entire anterior circulation as well as giving contributions to the posterior circulation by a large posterior communicating artery/fetal origin of the right PCA.  Because of this vascular anatomy, all of the acute infarctions could be caused by embolic disease within the right internal carotid system.   Original Report Authenticated By: Paulina Fusi, M.D.     Medications: Scheduled Meds:    . ipratropium  0.5 mg Nebulization Q6H   And  . albuterol  2.5 mg Nebulization Q6H  . clopidogrel  75 mg Oral Q breakfast  . enoxaparin  40 mg Subcutaneous Q24H  . ezetimibe  10 mg Oral Daily  . insulin aspart  0-9 Units Subcutaneous TID WC  . LORazepam  1 mg Oral TID  . metoprolol succinate  12.5 mg Oral Daily  . pantoprazole  40 mg Oral Daily  . rosuvastatin  40 mg Oral q1800  . Tamsulosin HCl  0.4 mg Oral Daily  . venlafaxine XR  150 mg Oral QHS  . zolpidem  10 mg Oral QHS  . [DISCONTINUED] atorvastatin  40 mg Oral q1800   Continuous Infusions:  PRN Meds:.hydrALAZINE, ondansetron (ZOFRAN) IV, senna-docusate    Active Problems:  Anxiety state, unspecified  COPD UNSPECIFIED  Peripheral vascular disease, unspecified  CVA (cerebral infarction)  HTN (hypertension)  DM (diabetes mellitus)  Dyslipidemia  CAD (coronary artery disease)  Hemiplegia, unspecified, affecting nondominant side  CVA (cerebral infarction)  MRI brain shows discrete infarctions in the region of right middle cerebral artery TEE ordered for cardioembolic source  We'll continue him on Plavix  . Anxiety state, unspecified  -Continue with Ativan   . COPD UNSPECIFIED  Will discontinue Spiriva at this time and start him on DuoNeb nebulizers every 6 hours  . HTN (hypertension)  -allow permissive HTN,  -will c/w Lopressor,hold Cozaar   . DM (diabetes mellitus)  -check A1C  -Hold Metformin  -start SSI   . Dyslipidemia  Patient has hypertriglyceridemia,continue Zetia  .h/o CAD s/p CABG and PAD    Stable  DVT prophylaxis Lovenox   LOS: 2 days   [employer] [facility] Pager: [phone] 07/26/2012, 1:01 PM

## 2012-07-26 NOTE — Evaluation (Signed)
Occupational Therapy Evaluation Patient Details Name: Ronald Frank MRN: [id number] DOB: 03/07/1935 Today's Date: 07/26/2012 Time: 4098-1191 OT Time Calculation (min): 40 min  OT Assessment / Plan / Recommendation Clinical Impression  This 77 yo male admitted with slurred speech and LUE weakness presents to acute OT with problems below. Pt usually lives at home with his wife and is her primary care-giver. Currently pt's son Skip and his wife are here from [place] to stay with pt and his wife. Explained to Skip that the patient is going to need help wth B/D due to decreased use of LUE and will not be able to help care for his wife at this time. I told him that I am  hopeful for good return of movment in pt's LUE but it will take a while. Son said that they were prepared to stay and help out even for a month. Recommended to him that they get bed rails for pt (due to reports that he id falling out of bed quite a bit), a hand held shower hose, and that I could  put in for the other  pieces of DME,  Will continue to follow pt acutely with OPOT follow up.    OT Assessment  Patient needs continued OT Services    Follow Up Recommendations  Outpatient OT    Barriers to Discharge None    Equipment Recommendations  Tub/shower seat;3 in 1 bedside comode (tub shower seat with a back)       Frequency  Min 3X/week    Precautions / Restrictions Precautions Precautions: None       ADL  Eating/Feeding: Simulated;Independent Where Assessed - Eating/Feeding: Edge of bed Grooming: Simulated;Supervision/safety;Set up Where Assessed - Grooming: Unsupported standing Upper Body Bathing: Simulated;Minimal assistance Where Assessed - Upper Body Bathing: Unsupported sitting Lower Body Bathing: Simulated;Set up;Supervision/safety Where Assessed - Lower Body Bathing: Unsupported sit to stand Upper Body Dressing: Simulated;Minimal assistance Where Assessed - Upper Body Dressing: Unsupported  sitting Lower Body Dressing: Moderate assistance;Performed Where Assessed - Lower Body Dressing: Unsupported sit to stand Toilet Transfer: Performed;Independent Toilet Transfer Method: Sit to Barista: Comfort height toilet;Grab bars Toileting - Architect and Hygiene: Simulated;Minimal assistance Where Assessed - Engineer, mining and Hygiene: Standing Equipment Used:  (None) Transfers/Ambulation Related to ADLs: Independent ADL Comments: Pt needs A with BADLs due to decreased of left arm and hand    OT Diagnosis: Generalized weakness;Hemiplegia non-dominant side  OT Problem List: Decreased strength;Decreased range of motion;Decreased activity tolerance;Impaired balance (sitting and/or standing);Decreased coordination;Decreased knowledge of use of DME or AE;Cardiopulmonary status limiting activity;Impaired UE functional use OT Treatment Interventions: Self-care/ADL training;Neuromuscular education;Therapeutic exercise;DME and/or AE instruction;Therapeutic activities;Patient/family education;Balance training;Energy conservation   OT Goals Acute Rehab OT Goals OT Goal Formulation: With patient Time For Goal Achievement: 08/02/12 Potential to Achieve Goals: Good ADL Goals Pt Will Perform Eating: with modified independence;Sitting, edge of bed;Unsupported ADL Goal: Eating - Progress: Goal set today Pt Will Perform Grooming: with modified independence;Unsupported;Standing at sink ADL Goal: Grooming - Progress: Goal set today Pt Will Perform Upper Body Bathing: with modified independence;Unsupported;Sit to stand in shower;Sit to stand from chair;Sit to stand from bed ADL Goal: Upper Body Bathing - Progress: Goal set today Pt Will Perform Lower Body Bathing: with modified independence;Unsupported;Sit to stand from chair;Sit to stand from bed;Sit to stand in shower ADL Goal: Lower Body Bathing - Progress: Goal set today Pt Will Perform Upper Body  Dressing: with modified independence;Unsupported;Sitting, chair;Sitting, bed ADL Goal: Upper Body  Dressing - Progress: Goal set today Pt Will Perform Lower Body Dressing: with min assist;Sit to stand from chair;Sit to stand from bed;Unsupported ADL Goal: Lower Body Dressing - Progress: Goal set today Arm Goals Pt Will Perform AROM: with supervision, verbal cues required/provided;Left upper extremity;1 set;10 reps (AAROM in supine and sitting) Arm Goal: AROM - Progress: Goal set today  Visit Information  Last OT Received On: 07/26/12 Assistance Needed: +1    Subjective Data  Subjective: "I am the caregiver for my wife, I do the cooking and cleaning, but 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"   Prior Functioning     Home Living Lives With: Spouse (has dementia) Available Help at Discharge: Family;Available 24 hours/day (per son, could stay even to up to a month with him) Type of Home: House Home Access: Stairs to enter [employer] of Steps: 5 Entrance Stairs-Rails: Right Home Layout: One level Bathroom Shower/Tub: Health visitor: Handicapped height Bathroom Accessibility: Yes How Accessible: Accessible via walker Home Adaptive Equipment: Walker - rolling;Straight cane Prior Function Level of Independence: Independent Able to Take Stairs?: Yes Driving: Yes Vocation: Retired Musician: No difficulties Dominant Hand: Right         Vision/Perception Vision - Assessment Eye Alignment: Within Functional Limits Ocular Range of Motion: Within Functional Limits Alignment/Gaze Preference: Within Defined Limits Tracking/Visual Pursuits: Able to track stimulus in all quads without difficulty Saccades: Within functional limits Visual Fields: No apparent deficits   Cognition  Overall Cognitive Status: Appears within functional limits for tasks assessed/performed Arousal/Alertness: Awake/alert Orientation Level: Appears intact for tasks  assessed Behavior During Session: [facility] for tasks performed    Extremity/Trunk Assessment Right Upper Extremity Assessment RUE ROM/Strength/Tone: Within functional levels Left Upper Extremity Assessment LUE ROM/Strength/Tone: Deficits LUE ROM/Strength/Tone Deficits: Pt with brunstrum level 5 arm (supine), 2 hand (supine) (some flexion, no extension of digits 2-5, no AROM seen for digit 1) LUE Sensation: WFL - Light Touch LUE Coordination: Deficits LUE Coordination Deficits: All gross movements tend to be fast, have to cue pt to slow down and control movments, FM absent at this time for any functional tasks     Mobility Bed Mobility Bed Mobility: Rolling Right;Right Sidelying to Sit;Sitting - Scoot to [employer] of Bed;Sit to Supine Rolling Right: 5: Supervision Right Sidelying to Sit: 5: Supervision;HOB flat Sitting - Scoot to Edge of Bed: 7: Independent Sit to Supine: 7: Independent;HOB flat Transfers Transfers: Sit to Stand;Stand to Sit Sit to Stand: 7: Independent;With upper extremity assist;From bed Stand to Sit: 7: Independent;With upper extremity assist;To bed        Exercise Other Exercises Other Exercises: Educated pt on AROM LUE flexion/extension, elbow flexion/extension, forearm supination/pronation, wrist extension, and trying to move fingers. He returned demonstrated.      End of Session OT - End of Session Equipment Utilized During Treatment:  (None) Activity Tolerance: Patient tolerated treatment well (Does get SOB due to COPD) Patient left: in bed;with call bell/phone within reach;with bed alarm set       Evette Georges [phone] 07/26/2012, 2:01 PM

## 2012-07-26 NOTE — Evaluation (Addendum)
Speech Language Pathology Evaluation Patient Details Name: Ronald Frank MRN: [id number] DOB: [birth year] Today's Date: 07/26/2012 Time: 0940-1000 SLP Time Calculation (min): 20 min  Problem List:  Patient Active Problem List  Diagnosis  . Anxiety state, unspecified  . COPD UNSPECIFIED  . Peripheral vascular disease, unspecified  . CVA (cerebral infarction)  . HTN (hypertension)  . DM (diabetes mellitus)  . Dyslipidemia  . CAD (coronary artery disease)  . Hemiplegia, unspecified, affecting nondominant side   Past Medical History:  Past Medical History  Diagnosis Date  . COPD (chronic obstructive pulmonary disease)   . Hyperlipidemia   . Myocardial infarction 2003  . Arthritis   . Joint pain   . Bronchitis   . Productive cough   . Anxiety    Past Surgical History:  Past Surgical History  Procedure Date  . Total hip arthroplasty 2005  . Coronary artery bypass graft 2003  . Femoral artery - femoral artery bypass graft 2003    right to left  . Lumbar disc surgery     x2 1999 & 2000  . Aortagram 01/20/2012    Abdominal Aortagram   HPI:  Pt admitted with LUE weakness and slurred. Speech. Pt found to have Numerous discrete infarctions scattered throughout the right middle cerebral artery territory affecting the basal ganglia and frontoparietal brain. Punctate acute infarctions in the right thalamus in the left basal ganglia.    Assessment / Plan / Recommendation Clinical Impression  Cognitive-linguistic evaluation complete. Patient presents with mild dysarthria primarily due to left sided lingual and labial weakness. Overall, speech is functional and improves with clinician cueing for slowed rate and increased volume. Education complete regarding speech intelligibility strategies. No further acute SLP needs indicated however recommend [facility] SLP f/u after d/c for dysarthria treatment.     SLP Assessment  All further Speech Lanaguage Pathology  needs can be addressed in the next  venue of care    Follow Up Recommendations  outpatient       Pertinent Vitals/Pain n/a   SLP Goals  SLP Goals Progress/Goals/Alternative treatment plan discussed with pt/caregiver and they: Agree  SLP Evaluation Prior Functioning  Cognitive/Linguistic Baseline: Within functional limits Type of Home: House Lives With: Spouse Available Help at Discharge: Family;Available 24 hours/day Vocation: Retired   IT consultant  Overall Cognitive Status: Appears within functional limits for tasks assessed    Comprehension  Auditory Comprehension Overall Auditory Comprehension: Appears within functional limits for tasks assessed Visual Recognition/Discrimination Discrimination: Not tested Reading Comprehension Reading Status: Not tested    Expression Expression Primary Mode of Expression: Verbal Verbal Expression Overall Verbal Expression: Appears within functional limits for tasks assessed Written Expression Dominant Hand: Right   Oral / Motor Oral Motor/Sensory Function Overall Oral Motor/Sensory Function: Impaired Labial ROM: Reduced left Labial Symmetry: Abnormal symmetry left Labial Strength: Reduced Labial Sensation: Within Functional Limits Lingual ROM: Within Functional Limits Lingual Symmetry: Within Functional Limits Lingual Strength: Reduced Lingual Sensation: Within Functional Limits Facial ROM: Reduced left Facial Symmetry: Left droop Facial Strength: Reduced Facial Sensation: Within Functional Limits Velum: Within Functional Limits Mandible: Within Functional Limits Motor Speech Overall Motor Speech: Impaired Respiration: Impaired Level of Impairment: Phrase Phonation: Hoarse;Low vocal intensity Resonance: Within functional limits Articulation: Impaired Level of Impairment: Phrase Intelligibility: Intelligibility reduced Word: 75-100% accurate Phrase: 75-100% accurate Sentence: 75-100% accurate Conversation: 75-100% accurate Motor Planning: Witnin  functional limits Effective Techniques: Slow rate;Increased vocal intensity   GO   Ferdinand Lango MA, CCC-SLP [phone]   Ferdinand Lango Meryl 07/26/2012, 10:07  AM

## 2012-07-27 ENCOUNTER — Encounter (HOSPITAL_COMMUNITY)
Admission: EM | Disposition: A | Discharge status: Home Health | Payer: Self-pay | Source: Home / Self Care | Attending: Family Medicine

## 2012-07-27 ENCOUNTER — Encounter (HOSPITAL_COMMUNITY): Payer: Self-pay | Admitting: *Deleted

## 2012-07-27 DIAGNOSIS — I6789 Other cerebrovascular disease: Secondary | ICD-10-CM

## 2012-07-27 DIAGNOSIS — I6350 Cerebral infarction due to unspecified occlusion or stenosis of unspecified cerebral artery: Secondary | ICD-10-CM

## 2012-07-27 HISTORY — PX: TEE WITHOUT CARDIOVERSION: SHX5443

## 2012-07-27 LAB — GLUCOSE, CAPILLARY: Glucose-Capillary: 107 mg/dL — ABNORMAL HIGH (ref 70–99)

## 2012-07-27 SURGERY — ECHOCARDIOGRAM, TRANSESOPHAGEAL
Anesthesia: Moderate Sedation

## 2012-07-27 MED ORDER — SODIUM CHLORIDE 0.9 % IV SOLN
INTRAVENOUS | Status: DC
  Administered 2012-07-27: 500 mL via INTRAVENOUS

## 2012-07-27 MED ORDER — FENTANYL CITRATE 0.05 MG/ML IJ SOLN
INTRAMUSCULAR | Status: AC
  Filled 2012-07-27: qty 2

## 2012-07-27 MED ORDER — ALBUTEROL SULFATE (5 MG/ML) 0.5% IN NEBU: 2.5000 mg | INHALATION_SOLUTION | Freq: Four times a day (QID) | RESPIRATORY_TRACT | Status: DC

## 2012-07-27 MED ORDER — MIDAZOLAM HCL 10 MG/2ML IJ SOLN
INTRAMUSCULAR | Status: DC | PRN
  Administered 2012-07-27 (×2): 2 mg via INTRAVENOUS

## 2012-07-27 MED ORDER — MIDAZOLAM HCL 5 MG/ML IJ SOLN
INTRAMUSCULAR | Status: AC
  Filled 2012-07-27: qty 2

## 2012-07-27 MED ORDER — FENTANYL CITRATE 0.05 MG/ML IJ SOLN
INTRAMUSCULAR | Status: DC | PRN
  Administered 2012-07-27: 25 ug via INTRAVENOUS

## 2012-07-27 MED ORDER — IPRATROPIUM BROMIDE 0.02 % IN SOLN: 0.5000 mg | Freq: Four times a day (QID) | RESPIRATORY_TRACT | Status: DC | PRN

## 2012-07-27 MED ORDER — ACETAMINOPHEN 325 MG PO TABS: 650.0000 mg | ORAL_TABLET | Freq: Four times a day (QID) | ORAL | Status: DC | PRN

## 2012-07-27 NOTE — Progress Notes (Signed)
   CARE MANAGEMENT NOTE 07/27/2012  Patient:  DECAMERON, HOOD   Account Number:  [id number]  Date Initiated:  07/27/2012  Documentation initiated by:  [facility]  Subjective/Objective Assessment:   CVA     Action/Plan:   Anticipated DC Date:  07/27/2012   Anticipated DC Plan:  HOME/SELF CARE      DC Planning Services  CM consult      Choice offered to / List presented to:             Status of service:  In process, will continue to follow Medicare Important Message given?   (If response is "NO", the following Medicare IM given date fields will be blank) Date Medicare IM given:   Date Additional Medicare IM given:    Discharge Disposition:    Per UR Regulation:    If discussed at Long Length of Stay Meetings, dates discussed:    Comments:  07/27/2012 1000 NCM spoke to dtr, [place], # [phone] or [phone]. States she assist both her parents with their care at home. States her father is mother's caregiver. She is more interested in [facility] than outpt OT/SLP. Would have difficulty getting to outpt therapy. States [name]. NCM explained will have MD give her a call to follow up on d/c plans. NCM sent message to attending that dtr is requesting a call. NCM offered choice for [facility] if orders by MD. States she did not have preference for agency.  Isidoro Donning RN CCM Case Mgmt phone [phone]

## 2012-07-27 NOTE — Discharge Summary (Addendum)
Physician Discharge Summary  Ronald Frank GNF:[id number] DOB: [birth year] DOA: 07/24/2012  PCP: Junious Silk, MD  Admit date: 07/24/2012 Discharge date: 07/27/2012  Time spent: 45  minutes  Recommendations for Outpatient Follow-up:  1. Follow primary care one week 2. Follow with neurology in 1 months 3. Outpatient holter monitoring as outpatient  Discharge Diagnoses:  Active Problems:  Anxiety state, unspecified  COPD UNSPECIFIED  Peripheral vascular disease, unspecified  CVA (cerebral infarction)  HTN (hypertension)  DM (diabetes mellitus)  Dyslipidemia  CAD (coronary artery disease)  Hemiplegia, unspecified, affecting nondominant side   Discharge Condition: Stable  Diet recommendation: heart healthy diet  Filed Weights   07/24/12 1546 07/24/12 2120  Weight: 101.152 kg (223 lb) 102.059 kg (225 lb)    History of present illness:  Patient is a 77 year old right-handed Caucasian male with a past medical history of diabetes, hypertension, dyslipidemia, coronary artery disease, peripheral vascular disease who presents to the hospital for the above-noted complaints. Per patient he noticed left upper extremity weakness at 3 AM today. He decided not to come to the hospital right away. Since the weakness was persistent and he also started having dysarthria he presented to the hospital. A CT of the head was done which was negative. Patient denies any prior history of CVA. He denies any headache. He denies any fever. I was subsequently asked to admit this patient for further evaluation and treatment  Hospital Course:   CVA patient was admitted, stroke work up was performed. MR of the brain showed numerous discrete infarctions scattered throughout the right middle cerebral artery affecting the basal ganglia and frontoparietal brain. Patient was seen by neurology, at this time they recommend to continue the patient on aspirin and Plavix. Transesophageal echocardiogram also was  performed in the hospital which did not show any thrombus in the heart or any PFO. We're going to set up for outpatient Holter monitoring, [facility]auer  cardiology has been called, and they will call the patient to set up an appointment. Home health PT eval has been set up with home health PT OT.  Hypertension Patient's blood pressure was elevated in the hospital, and on for permissive hypertension. He has been started back on his home medications and his blood pressure is stable. He'll continue to take his medications as outpatient  Dyslipidemia Patient will continue to take his Crestor as outpatient  Diabetes mellitus Patient will continue Glucophage as outpatient     Procedures:  TEE  Consultations:  Neurology  Discharge Exam: Filed Vitals:   07/27/12 1507 07/27/12 1515 07/27/12 1530 07/27/12 1545  BP: 207/117 179/104 152/90 167/94  Pulse:      Temp:      TempSrc:      Resp: 57 19 18 19   Height:      Weight:      SpO2: 98%  96% 94%    General: Appear in no acute distress Cardiovascular: S1-S2 is regular Respiratory: Clear to auscultation bilaterally Extremities: No edema Neurological: AO x3, left upper extremity weakness  Discharge Instructions  Discharge Orders    Future Appointments: Provider: Department: Dept Phone: Center:   04/26/2013 2:00 PM [facility] [phone] [facility]   04/26/2013 2:30 PM [facility] [facility] [phone] [facility]   04/26/2013 3:00 PM Evern Bio, NP [facility] [phone] [facility]     Future Orders Please Complete By Expires   Diet - low sodium heart healthy  Increase activity slowly          Medication List     As of 07/27/2012  4:50 PM    TAKE these medications         albuterol 108 (90 BASE) MCG/ACT inhaler   Commonly known as: PROVENTIL HFA;VENTOLIN HFA   Inhale 2 puffs into the lungs every 6 (six) hours as needed. For  wheezing      ALLEGRA PO   Take 1 tablet by mouth 2 (two) times daily.      aspirin EC 81 MG tablet   Take 81 mg by mouth daily.      clopidogrel 75 MG tablet   Commonly known as: PLAVIX   Take 1 tablet (75 mg total) by mouth daily.      EYE VITAMINS PO   Take 1 tablet by mouth 2 (two) times daily.      ezetimibe 10 MG tablet   Commonly known as: ZETIA   Take 10 mg by mouth daily.      HYDROcodone-acetaminophen 5-500 MG per tablet   Commonly known as: VICODIN   Take 1 tablet by mouth 2 (two) times daily as needed. For pain      LORazepam 0.5 MG tablet   Commonly known as: ATIVAN   Take 1 mg by mouth 3 (three) times daily. For anxiety      losartan 50 MG tablet   Commonly known as: COZAAR   Take 50 mg by mouth 2 (two) times daily.      metFORMIN 500 MG tablet   Commonly known as: GLUCOPHAGE   Take 500 mg by mouth 2 (two) times daily with a meal.      methocarbamol 500 MG tablet   Commonly known as: ROBAXIN   Take by mouth as needed.      metoprolol succinate 12.5 mg Tb24   Commonly known as: TOPROL-XL   Take 12.5 mg by mouth daily.      omeprazole 20 MG capsule   Commonly known as: PRILOSEC   Take 20 mg by mouth daily.      rosuvastatin 40 MG tablet   Commonly known as: CRESTOR   Take 20 mg by mouth daily.      Tamsulosin HCl 0.4 MG Caps   Commonly known as: FLOMAX   Take 0.4 mg by mouth daily.      tiotropium 18 MCG inhalation capsule   Commonly known as: SPIRIVA   Place 18 mcg into inhaler and inhale daily.      venlafaxine XR 75 MG 24 hr capsule   Commonly known as: EFFEXOR-XR   Take 150 mg by mouth at bedtime.      vitamin C 500 MG tablet   Commonly known as: ASCORBIC ACID   Take 500 mg by mouth daily.      Vitamin D (Ergocalciferol) 50000 UNITS Caps   Commonly known as: DRISDOL   Take 50,000 Units by mouth every 7 (seven) days. On Fridays      zolpidem 10 MG tablet   Commonly known as: AMBIEN   Take 10 mg by mouth at bedtime.             Follow-up Information    Follow up with ALTHEIMER,MICHAEL D, MD. In 1 week.   Contact information:   [address] [place] [address] [phone]       Follow up with Gates Rigg, MD. In 2 months.   Contact information:   [address], [address] [facility] [place] [address] [phone]           The results of significant diagnostics from this hospitalization (including imaging, microbiology, ancillary and laboratory) are listed below for reference.    Significant Diagnostic Studies: Dg Chest 2 View  07/24/2012  *RADIOLOGY REPORT*  Clinical Data: Short of breath.  Left-sided weakness.  Previous CABG.  CHEST - 2 VIEW  Comparison: 06/04/2012  Findings: There has been previous median sternotomy and CABG. Heart size is normal.  There is chronic scarring in the region of the lingula.  No infiltrate, mass, effusion or collapse.  No pulmonary edema.  Slightly chronically prominent interstitial markings.  IMPRESSION: No active disease.  Previous CABG.  Chronic scarring.   Original Report Authenticated By: Paulina Fusi, M.D.    Ct Head Wo Contrast  07/24/2012  *RADIOLOGY REPORT*  Clinical Data: Left-sided weakness.  CT HEAD WITHOUT CONTRAST  Technique:  Contiguous axial images were obtained from the base of the skull through the vertex without contrast.  Comparison: None.  Findings: The brain shows generalized atrophy.  There are extensive chronic appearing small vessel changes throughout the hemispheric white matter.  There is a cortical/subcortical infarction in the left parietal lobe.  There are a few old lacunar infarctions in the basal ganglia.  No identifiably acute infarction, mass lesion, hemorrhage, hydrocephalus or extra-axial collection.  There is atherosclerotic calcification of the major vessels at the base of the brain.  The calvarium is unremarkable.  Sinuses, middle ears and mastoids are clear.  IMPRESSION: No identifiably acute  insult.  Atrophy.  Extensive chronic small vessel disease.  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   Original Report Authenticated By: Paulina Fusi, M.D.    Mr Brain Wo Contrast  07/25/2012  *RADIOLOGY REPORT*  Clinical Data:  Stroke  MRI HEAD WITHOUT CONTRAST MRA HEAD WITHOUT CONTRAST  Technique:  Multiplanar, multiecho pulse sequences of the brain and surrounding structures were obtained without intravenous contrast. Angiographic images of the head were obtained using MRA technique without contrast.  Comparison:  Head CT 11/09  MRI HEAD  Findings:  Diffusion imaging shows multiple scattered infarctions throughout the right middle cerebral artery territory affecting the basal ganglia, posterior frontal and parietal brain.  The most extensive involvement is at the frontoparietal junction.  There is also a punctate infarction within the right thalamus and within the left basal ganglia.  Multiple vascular distributions involved.  There are mild chronic small vessel changes of the pons.  There are moderate chronic small vessel changes within the hemispheric white matter.  There is an old left parietal cortical and subcortical infarction.  No mass lesion, hemorrhage, hydrocephalus or extra- axial collection.  No pituitary mass.  No inflammatory sinus disease.  No skull or skull base lesion.  IMPRESSION: Numerous discrete infarctions scattered throughout the right middle cerebral artery territory affecting the basal ganglia and frontoparietal brain.  Punctate acute infarctions in the right thalamus in the left basal ganglia. Multiple vascular territories are involved.  MRA HEAD  Findings: There is no antegrade flow in the left internal carotid artery.  The right internal carotid arteries patent but shows atherosclerotic irregularity in the siphon region.  There is some motion artifact in that area.  The supraclinoid internal carotid artery on the right is widely patent.  Flow from this vessel supplies  the right middle cerebral artery territory, both anterior cerebral artery territories and the left middle cerebral artery territory through a patent anterior communicating artery.  There is diminished filling of the left MCA branches as would be expected.  Both vertebral arteries are patent to the basilar.  Detail is poor because of motion degradation.  The basilar artery is patent. There is probably a stenosis at the right vertebral basilar junction.  There is flow within both posterior cerebral arteries. Considerable stenotic disease effects these vessels.  There are patent posterior communicating arteries and or fetal origins.  IMPRESSION: Considerable degradation by motion artifact.  No antegrade flow in the left internal carotid artery.  The right internal carotid artery is patent to the brain and supplies the entire anterior circulation as well as giving contributions to the posterior circulation by a large posterior communicating artery/fetal origin of the right PCA.  Because of this vascular anatomy, all of the acute infarctions could be caused by embolic disease within the right internal carotid system.   Original Report Authenticated By: Paulina Fusi, M.D.    Mr Mra Head/brain Wo Cm  07/25/2012  *RADIOLOGY REPORT*  Clinical Data:  Stroke  MRI HEAD WITHOUT CONTRAST MRA HEAD WITHOUT CONTRAST  Technique:  Multiplanar, multiecho pulse sequences of the brain and surrounding structures were obtained without intravenous contrast. Angiographic images of the head were obtained using MRA technique without contrast.  Comparison:  Head CT 11/09  MRI HEAD  Findings:  Diffusion imaging shows multiple scattered infarctions throughout the right middle cerebral artery territory affecting the basal ganglia, posterior frontal and parietal brain.  The most extensive involvement is at the frontoparietal junction.  There is also a punctate infarction within the right thalamus and within the left basal ganglia.  Multiple  vascular distributions involved.  There are mild chronic small vessel changes of the pons.  There are moderate chronic small vessel changes within the hemispheric white matter.  There is an old left parietal cortical and subcortical infarction.  No mass lesion, hemorrhage, hydrocephalus or extra- axial collection.  No pituitary mass.  No inflammatory sinus disease.  No skull or skull base lesion.  IMPRESSION: Numerous discrete infarctions scattered throughout the right middle cerebral artery territory affecting the basal ganglia and frontoparietal brain.  Punctate acute infarctions in the right thalamus in the left basal ganglia. Multiple vascular territories are involved.  MRA HEAD  Findings: There is no antegrade flow in the left internal carotid artery.  The right internal carotid arteries patent but shows atherosclerotic irregularity in the siphon region.  There is some motion artifact in that area.  The supraclinoid internal carotid artery on the right is widely patent.  Flow from this vessel supplies the right middle cerebral artery territory, both anterior cerebral artery territories and the left middle cerebral artery territory through a patent anterior communicating artery.  There is diminished filling of the left MCA branches as would be expected.  Both vertebral arteries are patent to the basilar.  Detail is poor because of motion degradation.  The basilar artery is patent. There is probably a stenosis at the right vertebral basilar junction.  There is flow within both posterior cerebral arteries. Considerable stenotic disease effects these vessels.  There are patent posterior communicating arteries and or fetal origins.  IMPRESSION: Considerable degradation by motion artifact.  No antegrade flow in the left internal carotid artery.  The right internal carotid artery is patent to the brain and supplies the entire anterior circulation as well as giving contributions to the posterior circulation by a large  posterior communicating artery/fetal origin of the right PCA.  Because of this vascular anatomy, all  of the acute infarctions could be caused by embolic disease within the right internal carotid system.   Original Report Authenticated By: Paulina Fusi, M.D.     Microbiology: No results found for this or any previous visit (from the past 240 hour(s)).   Labs: Basic Metabolic Panel:  Lab 07/24/12 1610 07/24/12 1621  NA -- 140  K -- 4.1  CL -- 105  CO2 -- --  GLUCOSE -- 126*  BUN -- 13  CREATININE 0.95 1.00  CALCIUM -- --  MG -- --  PHOS -- --   Liver Function Tests: No results found for this basename: AST:5,ALT:5,ALKPHOS:5,BILITOT:5,PROT:5,ALBUMIN:5 in the last 168 hours No results found for this basename: LIPASE:5,AMYLASE:5 in the last 168 hours No results found for this basename: AMMONIA:5 in the last 168 hours CBC:  Lab 07/24/12 2143 07/24/12 1621 07/24/12 1549  WBC 8.6 -- 8.4  NEUTROABS -- -- 5.6  HGB 12.7* 13.9 13.3  HCT 36.9* 41.0 38.7*  MCV 96.3 -- 96.0  PLT 211 -- 213   Cardiac Enzymes: No results found for this basename: CKTOTAL:5,CKMB:5,CKMBINDEX:5,TROPONINI:5 in the last 168 hours BNP: BNP (last 3 results) No results found for this basename: PROBNP:3 in the last 8760 hours CBG:  Lab 07/27/12 1620 07/27/12 1401 07/27/12 1146 07/27/12 0746 07/26/12 2208  GLUCAP 107* 124* 116* 119* 133*       Signed:  Jaydence Arnesen S  [facility] 07/27/2012, 4:50 PM

## 2012-07-27 NOTE — H&P (View-Only) (Signed)
Stroke Team Progress Note  HISTORY Ronald Frank is a (RH) 77 y.o. male who reports noting at about 3AM on 07/24/12 that he was unable to use his left arm well. The patient reports that he was unable to sleep because he did not fel well. Went to reach for something and noted that his left arm was weak. He waited to see if the weakness would resolve on its own but it did not. His daughter called EMS and the patient was brought in for evaluation. Patient on ASA and Plavix at home.   LSN: [id number] on 07/24/12 tPA Given: No: Outside time window    He was admitted to the neuro unit for further evaluation and treatment.  SUBJECTIVE No new complaints. Hopes for discharge after TEE.  OBJECTIVE Most recent Vital Signs: Filed Vitals:   07/26/12 1818 07/26/12 2231 07/27/12 0605 07/27/12 0825  BP: 156/71 151/59 159/65   Pulse: 75 109 103 110  Temp: 98.1 F (36.7 C) 98 F (36.7 C) 99 F (37.2 C)   TempSrc: Oral Oral Oral   Resp: 18 18 18    Height:      Weight:      SpO2: 97% 91% 93% 98%   CBG (last 3)   Basename 07/27/12 0746 07/26/12 2208 07/26/12 1653  GLUCAP 119* 133* 150*    IV Fluid Intake:     MEDICATIONS     . albuterol  2.5 mg Nebulization Q6H  . clopidogrel  75 mg Oral Q breakfast  . enoxaparin  40 mg Subcutaneous Q24H  . ezetimibe  10 mg Oral Daily  . insulin aspart  0-9 Units Subcutaneous TID WC  . LORazepam  1 mg Oral TID  . metoprolol succinate  12.5 mg Oral Daily  . pantoprazole  40 mg Oral Daily  . rosuvastatin  40 mg Oral q1800  . Tamsulosin HCl  0.4 mg Oral Daily  . venlafaxine XR  150 mg Oral QHS  . zolpidem  10 mg Oral QHS  . [DISCONTINUED] albuterol  2.5 mg Nebulization Q6H  . [DISCONTINUED] ipratropium  0.5 mg Nebulization Q6H   PRN:  hydrALAZINE, ipratropium, ondansetron (ZOFRAN) IV, senna-docusate  Diet:  NPO for TEE Activity:  Up as tolerated DVT Prophylaxis: lovenox  CLINICALLY SIGNIFICANT STUDIES Basic Metabolic Panel:   Lab 07/24/12 2143  07/24/12 1621  NA -- 140  K -- 4.1  CL -- 105  CO2 -- --  GLUCOSE -- 126*  BUN -- 13  CREATININE 0.95 1.00  CALCIUM -- --  MG -- --  PHOS -- --    CBC:   Lab 07/24/12 2143 07/24/12 1621 07/24/12 1549  WBC 8.6 -- 8.4  NEUTROABS -- -- 5.6  HGB 12.7* 13.9 --  HCT 36.9* 41.0 --  MCV 96.3 -- 96.0  PLT 211 -- 213   Coagulation:   Lab 07/24/12 1549  LABPROT 11.9  INR 0.88    Lipid Panel    Component Value Date/Time   CHOL 138 07/25/2012 0530   TRIG 380* 07/25/2012 0530   HDL 28* 07/25/2012 0530   CHOLHDL 4.9 07/25/2012 0530   VLDL 76* 07/25/2012 0530   LDLCALC 34 07/25/2012 0530   HgbA1C  Lab Results  Component Value Date   HGBA1C 6.4* 07/25/2012   Dg Chest 2 View 07/24/2012   No active disease.  Previous CABG.  Chronic scarring.   .    Ct Head Wo Contrast 07/24/2012 No identifiably acute insult.  Atrophy.  Extensive chronic small vessel disease.  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     Mr Brain Wo Contrast 07/25/2012  MRI HEAD  Numerous discrete infarctions scattered throughout the right middle cerebral artery territory affecting the basal ganglia and frontoparietal brain.  Punctate acute infarctions in the right thalamus in the left basal ganglia. Multiple vascular territories are involved.   MRA HEAD   Considerable degradation by motion artifact.  No antegrade flow in the left internal carotid artery.  The right internal carotid artery is patent to the brain and supplies the entire anterior circulation as well as giving contributions to the posterior circulation by a large posterior communicating artery/fetal origin of the right PCA.  Because of this vascular anatomy, all of the acute infarctions could be caused by embolic disease within the right internal carotid system.    CT of the brain  No acute abnormality  2D Echocardiogram    TEE  Carotid Doppler  There is no significant right ICA stenosis. The left ICA appears occluded. Bilateral  vertebral artery flow is antegrade.  EKG SINUS TACHYCARDIA ~ V-rate> 99 RIGHT BUNDLE BRANCH BLOCK ~ QRSd>120, terminal axis(90,270)  Therapy Recommendations outpatient OT, no PT  Physical Exam  Pleasant elderly caucasian male not in distress.Awake alert. Afebrile. Head is nontraumatic. Neck is supple without bruit. Hearing is normal. Cardiac exam no murmur or gallop. Lungs are clear to auscultation. Distal pulses are well felt.  Neurological Exam ; Awake alert oriented x 3 normal speech and language.No dysarthria.extraocular movements are full range. Fundi not visualized. Vision acuity and fields appear normal.. Mild left lower face asymmetry. Tongue midline. Mild LUE drift. Mild diminished fine finger movements on left. Orbits right over left upper extremity. Mild left grip weak.. Normal sensation . Normal coordination. Gait not tested.  ASSESSMENT [name] is a 77 y.o. male presenting with  dysarthria, left face and upper extremity weakness. MRI confirms numerous discrete infarctions scattered throughout the right middle cerebral artery territory affecting the basal ganglia, frontoparietal brain, right thalamus and left basal ganglia. 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 Work up underway. On aspirin 81 mg orally every day plus Plavix 75 mg prior to admission. Now on Plavix mg orally plus baby aspirin every day for secondary stroke prevention. Patient with resultant  Mild left face and hand hemiparesis.   Diabetes mellitus (HGB A1C goal < 7.0)  Coronary artery disease  peripheral artery disease  Hypertension (goal 130/80)  Hyperlipidemia (LDL goal < 70)  Hospital day # 3  TREATMENT/PLAN  Continue clopidogrel 75 mg and baby aspirin orally every day for secondary stroke prevention/cardiac disease.  TEE to look for embolic source arranged for am 07/27/12. If positive for PFO (patent foramen ovale), check bilateral lower extremity venous dopplers to rule out DVT as possible source of  stroke.  If TEE negative, please schedule outpatient telemetry monitoring to assess patient for atrial fibrillation as source of stroke. May be arranged with patient's cardiologist, or cardiologist of choice.  If TEE unrevealing, please schedule an outpatient TCD bubble study with emboli monitoring with Dr. Pearlean Brownie in 1 month to further evaluate for possible PFO. Have patient call for appointment.  Ok for discharge from neuro standpoint after TEE Outpatient OT Ongoing risk factor management  Annie Main, MSN, RN, ANVP-BC, ANP-BC, GNP-BC [facility] Pager: [phone] 07/27/2012 9:24 AM  Scribe for Dr. Delia Heady, Stroke Center Medical Director, who has personally reviewed chart, pertinent data, examined the patient and developed the plan of care. Pager:  [phone]

## 2012-07-27 NOTE — Interval H&P Note (Signed)
History and Physical Interval Note:  07/27/2012 2:44 PM  Ronald Frank  has presented today for surgery, with the diagnosis of cva  The various methods of treatment have been discussed with the patient and family. After consideration of risks, benefits and other options for treatment, the patient has consented to  Procedure(s) (LRB) with comments: TRANSESOPHAGEAL ECHOCARDIOGRAM (TEE) (N/A) as a surgical intervention .  The patient's history has been reviewed, patient examined, no change in status, stable for surgery.  I have reviewed the patient's chart and labs.  Questions were answered to the patient's satisfaction.     Olga Millers

## 2012-07-27 NOTE — Progress Notes (Signed)
Occupational Therapy Treatment Patient Details Name: Ronald Frank MRN: [id number] DOB: [birth year] Today's Date: 07/27/2012 Time: 4098-1191 OT Time Calculation (min): 27 min  OT Assessment / Plan / Recommendation Comments on Treatment Session This 77 yo showing progress in movements of his LUE however is limited in activity due to DOE and fatigue. Will continue to follow.    Follow Up Recommendations  Outpatient OT       Equipment Recommendations  Tub/shower seat;3 in 1 bedside comode (tub shower seat with back)       Frequency Min 3X/week   Plan Discharge plan remains appropriate    Precautions / Restrictions Precautions Precautions: None Restrictions Weight Bearing Restrictions: No   Pertinent Vitals/Pain Very tired    ADL  Grooming: Performed;Wash/dry face (Max A hand over hand with LUE, I with RUE) Where Assessed - Grooming: Unsupported standing Transfers/Ambulation Related to ADLs: Independent ADL Comments: Worked on him using his RUE as a gross A/grasp while he manipulated tops with his right hand. Also worked on chair pushups so he could get some good weightbearing through his LUE. Pt very tired (says he never sleeps well at night)      OT Goals ADL Goals  Pt Will Perform Eating: with modified independence;Sitting, edge of bed;Unsupported  Pt Will Perform Grooming: with modified independence;Unsupported;Standing at sink ADL Goal: Grooming - Progress: Progressing toward goals  Pt Will Perform Upper Body Bathing: with modified independence;Unsupported;Sit to stand from chair;Sit to stand from bed;Sit to stand in shower  Pt Will Perform Lower Body Bathing: with modified independence;Unsupported;Sit to stand from chair;Sit to stand from bed;Sit to stand in shower  Pt Will Perform Upper Body Dressing: with modified independence;Unsupported;Sitting, bed;Sitting, chair  Pt Will Perform Lower Body Dressing: with min assist;Sit to stand from chair;Sit to stand from  bed;Unsupported  Arm Goals Pt Will Perform AROM: with supervision, verbal cues required/provided;Left upper extremity;1 set;10 reps (AAROM all joints supine and sitting) Arm Goal: AROM - Progress: Progressing toward goal  Visit Information  Last OT Received On: 07/27/12 Assistance Needed: +1    Subjective Data  Subjective: I am just so tired      Cognition  Overall Cognitive Status: Appears within functional limits for tasks assessed/performed Arousal/Alertness: Lethargic Orientation Level: Appears intact for tasks assessed Behavior During Session: Lethargic    Mobility   Bed Mobility Bed Mobility: Rolling Left;Left Sidelying to Sit;Sitting - Scoot to [place] of Bed;Sit to Supine Rolling Left: 6: Modified independent (Device/Increase time) Left Sidelying to Sit: 4: Min guard;HOB flat (with cues for pt to push through his LUE(forearm up to sit)) Sitting - Scoot to Edge of Bed: 7: Independent Sit to Supine: 7: Independent Transfers Transfers: Sit to Stand;Stand to Sit Sit to Stand: 7: Independent;With upper extremity assist;From bed Stand to Sit: 7: Independent;With upper extremity assist;With armrests;To chair/3-in-1 (pt puts LUE on arm rest of recliner without cues)       Exercises  Other Exercises Other Exercises: In supine pt is able to do his shoulder flexion exercises with minmal abductor drift, in sitting he needs A for shoulder flexion to 90 degrees and to help keep arm close to body. Elbow flexion in supine pt has increased shoulder flexion; however in sitting decreased shoulder flexion due to gravity. With these two exercises pt's wrist is in flexion (can correct with great effort if really concentrates and is cued. Other Exercises: Yesterday, pt had only minimal extension of wrist past neutral, today he can fully extend wrist against gravity and  has better control of  flexion. Today pt has a tighter grasp in LUE than he had yesterday and has digit extension as well. Still  no AROM noted in 1st digit.      End of Session OT - End of Session Equipment Utilized During Treatment:  (None) Activity Tolerance: Patient limited by fatigue (DOE, sats look good) Patient left: in bed;with call bell/phone within reach;with bed alarm set Nurse Communication:  (That reports that he is very tired and does not sleep well)       Evette Georges 07/27/2012, 9:11 AM

## 2012-07-27 NOTE — Progress Notes (Signed)
Stroke Team Progress Note  HISTORY Ronald Frank is a (RH) 77 y.o. male who reports noting at about 3AM on 07/24/12 that he was unable to use his left arm well. The patient reports that he was unable to sleep because he did not fel well. Went to reach for something and noted that his left arm was weak. He waited to see if the weakness would resolve on its own but it did not. His daughter called EMS and the patient was brought in for evaluation. Patient on ASA and Plavix at home.   LSN: [id number] on 07/24/12 tPA Given: No: Outside time window    He was admitted to the neuro unit for further evaluation and treatment.  SUBJECTIVE No new complaints. Hopes for discharge after TEE.  OBJECTIVE Most recent Vital Signs: Filed Vitals:   07/26/12 1818 07/26/12 2231 07/27/12 0605 07/27/12 0825  BP: 156/71 151/59 159/65   Pulse: 75 109 103 110  Temp: 98.1 F (36.7 C) 98 F (36.7 C) 99 F (37.2 C)   TempSrc: Oral Oral Oral   Resp: 18 18 18   Height:      Weight:      SpO2: 97% 91% 93% 98%   CBG (last 3)   Basename 07/27/12 0746 07/26/12 2208 07/26/12 1653  GLUCAP 119* 133* 150*    IV Fluid Intake:     MEDICATIONS     . albuterol  2.5 mg Nebulization Q6H  . clopidogrel  75 mg Oral Q breakfast  . enoxaparin  40 mg Subcutaneous Q24H  . ezetimibe  10 mg Oral Daily  . insulin aspart  0-9 Units Subcutaneous TID WC  . LORazepam  1 mg Oral TID  . metoprolol succinate  12.5 mg Oral Daily  . pantoprazole  40 mg Oral Daily  . rosuvastatin  40 mg Oral q1800  . Tamsulosin HCl  0.4 mg Oral Daily  . venlafaxine XR  150 mg Oral QHS  . zolpidem  10 mg Oral QHS  . [DISCONTINUED] albuterol  2.5 mg Nebulization Q6H  . [DISCONTINUED] ipratropium  0.5 mg Nebulization Q6H   PRN:  hydrALAZINE, ipratropium, ondansetron (ZOFRAN) IV, senna-docusate  Diet:  NPO for TEE Activity:  Up as tolerated DVT Prophylaxis: lovenox  CLINICALLY SIGNIFICANT STUDIES Basic Metabolic Panel:   Lab 07/24/12 2143  07/24/12 1621  NA -- 140  K -- 4.1  CL -- 105  CO2 -- --  GLUCOSE -- 126*  BUN -- 13  CREATININE 0.95 1.00  CALCIUM -- --  MG -- --  PHOS -- --    CBC:   Lab 07/24/12 2143 07/24/12 1621 07/24/12 1549  WBC 8.6 -- 8.4  NEUTROABS -- -- 5.6  HGB 12.7* 13.9 --  HCT 36.9* 41.0 --  MCV 96.3 -- 96.0  PLT 211 -- 213   Coagulation:   Lab 07/24/12 1549  LABPROT 11.9  INR 0.88    Lipid Panel    Component Value Date/Time   CHOL 138 07/25/2012 0530   TRIG 380* 07/25/2012 0530   HDL 28* 07/25/2012 0530   CHOLHDL 4.9 07/25/2012 0530   VLDL 76* 07/25/2012 0530   LDLCALC 34 07/25/2012 0530   HgbA1C  Lab Results  Component Value Date   HGBA1C 6.4* 07/25/2012   Dg Chest 2 View 07/24/2012   No active disease.  Previous CABG.  Chronic scarring.   .    Ct Head Wo Contrast 07/24/2012 No identifiably acute insult.  Atrophy.  Extensive chronic small vessel disease.    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     Mr Brain Wo Contrast 07/25/2012  MRI HEAD  Numerous discrete infarctions scattered throughout the right middle cerebral artery territory affecting the basal ganglia and frontoparietal brain.  Punctate acute infarctions in the right thalamus in the left basal ganglia. Multiple vascular territories are involved.   MRA HEAD   Considerable degradation by motion artifact.  No antegrade flow in the left internal carotid artery.  The right internal carotid artery is patent to the brain and supplies the entire anterior circulation as well as giving contributions to the posterior circulation by a large posterior communicating artery/fetal origin of the right PCA.  Because of this vascular anatomy, all of the acute infarctions could be caused by embolic disease within the right internal carotid system.    CT of the brain  No acute abnormality  2D Echocardiogram    TEE  Carotid Doppler  There is no significant right ICA stenosis. The left ICA appears occluded. Bilateral  vertebral artery flow is antegrade.  EKG SINUS TACHYCARDIA ~ V-rate> 99 RIGHT BUNDLE BRANCH BLOCK ~ QRSd>120, terminal axis(90,270)  Therapy Recommendations outpatient OT, no PT  Physical Exam  Pleasant elderly caucasian male not in distress.Awake alert. Afebrile. Head is nontraumatic. Neck is supple without bruit. Hearing is normal. Cardiac exam no murmur or gallop. Lungs are clear to auscultation. Distal pulses are well felt.  Neurological Exam ; Awake alert oriented x 3 normal speech and language.No dysarthria.extraocular movements are full range. Fundi not visualized. Vision acuity and fields appear normal.. Mild left lower face asymmetry. Tongue midline. Mild LUE drift. Mild diminished fine finger movements on left. Orbits right over left upper extremity. Mild left grip weak.. Normal sensation . Normal coordination. Gait not tested.  ASSESSMENT [name] is a 77 y.o. male presenting with  dysarthria, left face and upper extremity weakness. MRI confirms numerous discrete infarctions scattered throughout the right middle cerebral artery territory affecting the basal ganglia, frontoparietal brain, right thalamus and left basal ganglia. 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 Work up underway. On aspirin 81 mg orally every day plus Plavix 75 mg prior to admission. Now on Plavix mg orally plus baby aspirin every day for secondary stroke prevention. Patient with resultant  Mild left face and hand hemiparesis.   Diabetes mellitus (HGB A1C goal < 7.0)  Coronary artery disease  peripheral artery disease  Hypertension (goal 130/80)  Hyperlipidemia (LDL goal < 70)  Hospital day # 3  TREATMENT/PLAN  Continue clopidogrel 75 mg and baby aspirin orally every day for secondary stroke prevention/cardiac disease.  TEE to look for embolic source arranged for am 07/27/12. If positive for PFO (patent foramen ovale), check bilateral lower extremity venous dopplers to rule out DVT as possible source of  stroke.  If TEE negative, please schedule outpatient telemetry monitoring to assess patient for atrial fibrillation as source of stroke. May be arranged with patient's cardiologist, or cardiologist of choice.  If TEE unrevealing, please schedule an outpatient TCD bubble study with emboli monitoring with Dr. Sethi in 1 month to further evaluate for possible PFO. Have patient call for appointment.  Ok for discharge from neuro standpoint after TEE Outpatient OT Ongoing risk factor management  SHARON BIBY, MSN, RN, ANVP-BC, ANP-BC, GNP-BC [facility] Pager: [phone] 07/27/2012 9:24 AM  Scribe for Dr. Pramod Sethi, Stroke Center Medical Director, who has personally reviewed chart, pertinent data, examined the patient and developed the plan of care. Pager:  [phone]      

## 2012-07-27 NOTE — Progress Notes (Signed)
  Echocardiogram 2D Echocardiogram has been performed.  Cathie Beams 07/27/2012, 12:50 PM

## 2012-07-27 NOTE — Progress Notes (Signed)
   CARE MANAGEMENT NOTE 07/27/2012  Patient:  Ronald Frank, Ronald Frank   Account Number:  [id number]  Date Initiated:  07/27/2012  Documentation initiated by:  [facility]  Subjective/Objective Assessment:   CVA     Action/Plan:   Anticipated DC Date:  07/27/2012   Anticipated DC Plan:  HOME/SELF CARE      DC Planning Services  CM consult      [facility] Choice  HOME HEALTH   Choice offered to / List presented to:  C-4 Adult Children        HH arranged  HH-2 PT      HH agency  [facility]   Status of service:  Completed, signed off Medicare Important Message given?   (If response is "NO", the following Medicare IM given date fields will be blank) Date Medicare IM given:   Date Additional Medicare IM given:    Discharge Disposition:  HOME W HOME HEALTH SERVICES  Per UR Regulation:    If discussed at Long Length of Stay Meetings, dates discussed:    Comments:  07/27/2012 1645 MD spoke to dtr and she requested [facility] for [facility]. Notified [facility] for [facility] PT for scheduled d/c home today. Notified [facility] that 3n1 needed for home. Isidoro Donning RN CCM Case Mgmt phone [phone]  07/27/2012 1000 NCM spoke to dtrDarl Frank, # [phone] or [phone]. States she assist both her parents with their care at home. States her father is mother's caregiver. She is more interested in [facility] than outpt OT/SLP. Would have difficulty getting to outpt therapy. States [name]. NCM explained will have MD give her a call to follow up on d/c plans. NCM sent message to attending that dtr is requesting a call. NCM offered choice for [facility] if orders by MD. States she did not have preference for agency.  Isidoro Donning RN CCM Case Mgmt phone [phone]

## 2012-07-27 NOTE — CV Procedure (Signed)
See full TEE report in [employer]; Normal LV function; moderate atherosclerosis descending aorta; negative saline microcavitation study. Ronald Frank

## 2012-07-28 ENCOUNTER — Encounter (HOSPITAL_COMMUNITY): Payer: Self-pay | Admitting: Cardiology

## 2012-07-28 NOTE — Progress Notes (Signed)
  Echocardiogram Echocardiogram Transesophageal has been performed.  Ronald Frank 07/28/2012, 3:17 PM

## 2012-08-05 DIAGNOSIS — E669 Obesity, unspecified: Secondary | ICD-10-CM | POA: Insufficient documentation

## 2012-08-10 ENCOUNTER — Ambulatory Visit (INDEPENDENT_AMBULATORY_CARE_PROVIDER_SITE_OTHER): Payer: Medicare Other | Admitting: Internal Medicine

## 2012-08-10 ENCOUNTER — Encounter: Payer: Self-pay | Admitting: Internal Medicine

## 2012-08-10 VITALS — BP 144/82 | HR 100 | Temp 97.7°F | Ht 70.0 in | Wt 212.0 lb

## 2012-08-10 DIAGNOSIS — R0989 Other specified symptoms and signs involving the circulatory and respiratory systems: Secondary | ICD-10-CM

## 2012-08-10 DIAGNOSIS — R0602 Shortness of breath: Secondary | ICD-10-CM

## 2012-08-10 DIAGNOSIS — J449 Chronic obstructive pulmonary disease, unspecified: Secondary | ICD-10-CM

## 2012-08-10 DIAGNOSIS — R0600 Dyspnea, unspecified: Secondary | ICD-10-CM

## 2012-08-10 MED ORDER — FAMOTIDINE 20 MG PO TABS: ORAL_TABLET | ORAL | Status: DC

## 2012-08-10 MED ORDER — CLONAZEPAM 0.5 MG PO TABS: ORAL_TABLET | ORAL | Status: DC

## 2012-08-10 NOTE — Patient Instructions (Signed)
Clonazepam 0.5 mg twice daily  Only use ativan if needed for anxiety  Prilosec 20 mg proton pump inhibitor and Pepcid 20 mg one at bedtime  Stop spiriva  Please schedule a follow up office visit in 6 weeks, call sooner if needed with pft's

## 2012-08-10 NOTE — Progress Notes (Signed)
Subjective:    Patient ID: Ronald Frank, male    DOB: [birth year]  MRN: [id number]  HPI  33 yowm quit smoking 2003 @ CABG @ wt = 185 then gained up peak of 225 with sob x 2007 with pfts 07/2008 GOLD I copd with fev1 over 3liters   May 26, 2008 doe worsening to point where has difficulty with more than 10-15 min gardening more than slow walking going to mailbox no nighttime co's or assoc cough or cp, also trouble from woods to house because of incline that started noticing noticing was difficult in 2008.   July 25, 2008 ov for PFT's no better on spiriva. > wnl> rec clonazepam    08/10/2012 ov/ Mar [place]/ reestablish for dx of copd with sob at rest, also across the room. No better with spiriva, no better with albuterol. No obvious daytime variabilty or assoc chronic cough or cp or chest tightness, subjective wheeze overt sinus or hb symptoms. No unusual exp hx or h/o childhood pna/ asthma or premature birth to his knowledge.   Sleeping ok resp wise without nocturnal  or early am exacerbation  of respiratory  c/o's or need for noct saba. Also denies any obvious fluctuation of symptoms with weather or environmental changes or other aggravating or alleviating factors except as outlined above   ROS  The following are not active complaints unless bolded sore throat, dysphagia, dental problems, itching, sneezing,  nasal congestion or excess/ purulent secretions, ear ache,   fever, chills, sweats, unintended wt loss, pleuritic or exertional cp, hemoptysis,  orthopnea pnd or leg swelling, presyncope, palpitations, heartburn, abdominal pain, anorexia, nausea, vomiting, diarrhea  or change in bowel or urinary habits, change in stools or urine, dysuria,hematuria,  rash, arthralgias, visual complaints, headache, numbness weakness or ataxia or problems with walking or coordination,  change in mood/affect or memory.       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  HENT: Positive for congestion. Negative for ear pain, nosebleeds, sore throat, rhinorrhea, sneezing, trouble swallowing, dental problem, postnasal drip and sinus pressure.   Eyes: Negative for redness and itching.  Respiratory: Positive for cough and shortness of breath. Negative for chest tightness and wheezing.   Cardiovascular: Positive for chest pain and leg swelling. Negative for palpitations.  Gastrointestinal: Negative for nausea and vomiting.  Genitourinary: Negative for dysuria.  Musculoskeletal: Positive for joint swelling.  Skin: Negative for rash.  Neurological: Negative for headaches.  Hematological: Does not bruise/bleed easily.  Psychiatric/Behavioral: Positive for dysphoric mood. The patient is nervous/anxious.        Objective:   Physical Exam  Obese depressed amb wm with obvious sigh breathing  wt = 220 05/26/08 > 217 July 25, 2008 > 212 08/10/2012  HEENT mild turbinate edema. Oropharynx no thrush or excess pnd or cobblestoning. No JVD or cervical adenopathy. Mild accessory muscle hypertrophy. Trachea midline, nl thryroid. Chest was hyperinflated by percussion with diminished breath sounds and moderate increased exp time without wheeze. Hoover sign positive at mid inspiration.  Regular rate and rhythm without murmur gallop or rub or increase P2. Abd: no hsm, nl excursion. Ext warm without Cyanosis or clubbing  07/24/12 cxr  No active disease. Previous CABG. Chronic scarring.     Assessment & Plan:

## 2012-08-11 DIAGNOSIS — R0600 Dyspnea, unspecified: Secondary | ICD-10-CM | POA: Insufficient documentation

## 2012-08-12 NOTE — Assessment & Plan Note (Signed)
Symptoms are markedly disproportionate to objective findings and not clear this is a lung problem but pt does appear to have difficult airway management issues.   DDX of  difficult airways managment all start with A and  include Adherence, Ace Inhibitors, Acid Reflux, Active Sinus Disease, Alpha 1 Antitripsin deficiency, Anxiety masquerading as Airways dz,  ABPA,  allergy(esp in young), Aspiration (esp in elderly), Adverse effects of DPI,  Active smokers, plus two Bs  = Bronchiectasis and Beta blocker use..and one C= CHF   Anxiety is typically a dx of exclusion but the leading suspect here. Try low dose clonazepam and continue prn ativan

## 2012-08-12 NOTE — Assessment & Plan Note (Signed)
-   08/10/2012   Walked RA x one-half  lap @ 185 stopped due to weak, unsteady, sob but no desat  Unable to reproduce this complaint walking, no worse walking than resting suggests anxiety a leading suspect

## 2012-09-14 ENCOUNTER — Other Ambulatory Visit: Payer: Self-pay | Admitting: *Deleted

## 2012-09-14 MED ORDER — CLONAZEPAM 0.5 MG PO TABS: ORAL_TABLET | ORAL | Status: DC

## 2012-09-14 NOTE — Telephone Encounter (Signed)
Got the okay to call in per [facility].

## 2012-10-04 ENCOUNTER — Ambulatory Visit (INDEPENDENT_AMBULATORY_CARE_PROVIDER_SITE_OTHER): Payer: Medicare Other | Admitting: Internal Medicine

## 2012-10-04 ENCOUNTER — Encounter: Payer: Self-pay | Admitting: Internal Medicine

## 2012-10-04 VITALS — BP 130/66 | HR 82 | Temp 96.0°F | Ht 70.0 in | Wt 219.0 lb

## 2012-10-04 DIAGNOSIS — R0989 Other specified symptoms and signs involving the circulatory and respiratory systems: Secondary | ICD-10-CM

## 2012-10-04 DIAGNOSIS — R0600 Dyspnea, unspecified: Secondary | ICD-10-CM

## 2012-10-04 DIAGNOSIS — J4489 Other specified chronic obstructive pulmonary disease: Secondary | ICD-10-CM

## 2012-10-04 DIAGNOSIS — J449 Chronic obstructive pulmonary disease, unspecified: Secondary | ICD-10-CM

## 2012-10-04 DIAGNOSIS — R0609 Other forms of dyspnea: Secondary | ICD-10-CM

## 2012-10-04 LAB — PULMONARY FUNCTION TEST

## 2012-10-04 NOTE — Progress Notes (Signed)
PFT done. [place], CMA  

## 2012-10-04 NOTE — Progress Notes (Signed)
Subjective:    Patient ID: Ronald Frank, male    DOB: 1934/10/20  MRN: [id number]  HPI  42 yowm quit smoking 2003 @ CABG @ wt = 185 then gained up peak of 225 with sob x 2007 with pfts 07/2008 GOLD I copd with fev1 over 3liters   May 26, 2008 doe worsening to point where has difficulty with more than 10-15 min gardening more than slow walking going to mailbox no nighttime co's or assoc cough or cp, also trouble from woods to house because of incline that started noticing noticing was difficult in 2008.   July 25, 2008 ov for PFT's no better on spiriva. > wnl> rec clonazepam trial   08/10/2012 ov/ Wert/ reestablish for dx of copd with sob at rest, also across the room. No better with spiriva, no better with albuterol.  rec Clonazepam 0.5 mg twice daily  Only use ativan if needed for anxiety Prilosec 20 mg proton pump inhibitor and Pepcid 20 mg one at bedtime Stop spiriva   10/04/2012 f/u ov/Wert cc breathing slt better than baseline, No obvious daytime variabilty or assoc chronic cough or cp or chest tightness, subjective wheeze overt sinus or hb symptoms. No unusual exp hx or h/o childhood pna/ asthma or premature birth to his knowledge.   Not using any albuterol or prn ativan  No obvious daytime variabilty or assoc chronic cough or cp or chest tightness, subjective wheeze overt sinus or hb symptoms. No unusual exp hx or h/o childhood pna/ asthma or premature birth to his knowledge.   Sleeping ok resp wise without nocturnal  or early am exacerbation  of respiratory  c/o's or need for noct saba. Also denies any obvious fluctuation of symptoms with weather or environmental changes or other aggravating or alleviating factors except as outlined above   ROS  The following are not active complaints unless bolded sore throat, dysphagia, dental problems, itching, sneezing,  nasal congestion or excess/ purulent secretions, ear ache,   fever, chills, sweats, unintended wt loss,  pleuritic or exertional cp, hemoptysis,  orthopnea pnd or leg swelling, presyncope, palpitations, heartburn, abdominal pain, anorexia, nausea, vomiting, diarrhea  or change in bowel or urinary habits, change in stools or urine, dysuria,hematuria,  rash, arthralgias, visual complaints, headache, numbness weakness or ataxia or problems with walking or coordination,  change in mood/affect or memory.       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 Oropharynx no thrush or excess pnd or cobblestoning. No JVD or cervical adenopathy. Mild accessory muscle hypertrophy. Trachea midline, nl thryroid. Chest was min hyperinflated by percussion with diminished breath sounds and mild increased exp time without wheeze. Hoover sign positive at end inspiration. Regular rate and rhythm without murmur gallop or rub or increase P2. Abd: no hsm, nl excursion. Ext warm without Cyanosis or clubbing  07/24/12 cxr  No  active disease. Previous CABG. Chronic scarring.     Assessment & Plan:

## 2012-10-04 NOTE — Patient Instructions (Addendum)
You do not have significant copd and likely never will  You are prone to asthma (so keep your rescue inhaler handy) and the effects of being somewhat overweight via acid reflux  GERD (REFLUX)  is an extremely common cause of respiratory symptoms, many times with no significant heartburn at all.    It can be treated with medication, but also with lifestyle changes including avoidance of late meals, excessive alcohol, smoking cessation, and avoid fatty foods, chocolate, peppermint, colas, red wine, and acidic juices such as orange juice.  NO MINT OR MENTHOL PRODUCTS SO NO COUGH DROPS  USE SUGARLESS CANDY INSTEAD (jolley ranchers or Stover's)  NO OIL BASED VITAMINS - use powdered substitutes.    Pulmonary follow up is as needed

## 2012-10-04 NOTE — Assessment & Plan Note (Signed)
GOLD I and not limting  As I explained to this patient in detail:  although there may be significant copd present, it does not appear to be limiting activity tolerance any more than a set of worn tires limits someone from driving a car  around a parking lot.  A new set of Michelins might look good but would have no perceived impact on the performance of the car and would not be worth the cost.  That is to say:   this pt is so sedentary I don't recommend aggressive pulmonary rx at this point unless limiting symptoms arise or acute exacerbations become as issue, neither of which are the case now.  I asked the patient to contact this office at any time in the future should either of these problems arise.

## 2012-10-04 NOTE — Assessment & Plan Note (Signed)
-   08/10/2012   Walked RA x one-half  lap @ 185 stopped due to weak, unsteady, sob but no desat  ERV reduced c/w obesity/ decontioning > advised

## 2012-11-01 ENCOUNTER — Encounter: Payer: Self-pay | Admitting: Internal Medicine

## 2012-11-04 ENCOUNTER — Telehealth: Payer: Self-pay | Admitting: *Deleted

## 2012-11-04 MED ORDER — CLONAZEPAM 0.5 MG PO TABS: ORAL_TABLET | ORAL | Status: DC

## 2012-11-04 NOTE — Telephone Encounter (Signed)
Last OV 10/04/2012 Last fill Clonazepam #60 was 09/14/12  Please advise if this is okay to refill. Thanks.

## 2012-11-04 NOTE — Telephone Encounter (Signed)
m °

## 2012-11-04 NOTE — Telephone Encounter (Signed)
Ok x one only, needs f/u with primary for further refills as this is not a pulmonary med and f/u here is prn

## 2012-12-09 ENCOUNTER — Other Ambulatory Visit: Payer: Self-pay | Admitting: Orthopedic Surgery

## 2012-12-09 DIAGNOSIS — S2341XA Sprain of ribs, initial encounter: Secondary | ICD-10-CM

## 2012-12-15 ENCOUNTER — Ambulatory Visit
Admission: RE | Admit: 2012-12-15 | Discharge: 2012-12-15 | Disposition: A | Discharge status: Home / Self Care | Payer: Medicare Other | Source: Ambulatory Visit | Attending: Orthopedic Surgery | Admitting: Orthopedic Surgery

## 2012-12-15 DIAGNOSIS — S2341XA Sprain of ribs, initial encounter: Secondary | ICD-10-CM

## 2012-12-30 ENCOUNTER — Ambulatory Visit: Payer: Self-pay | Admitting: Nurse Practitioner

## 2013-01-04 ENCOUNTER — Other Ambulatory Visit: Payer: Self-pay | Admitting: *Deleted

## 2013-01-07 ENCOUNTER — Ambulatory Visit (INDEPENDENT_AMBULATORY_CARE_PROVIDER_SITE_OTHER): Payer: Medicare Other | Admitting: Podiatry

## 2013-01-07 ENCOUNTER — Encounter: Payer: Self-pay | Admitting: Podiatry

## 2013-01-07 VITALS — BP 149/72 | HR 87 | Ht 70.0 in | Wt 219.0 lb

## 2013-01-07 DIAGNOSIS — L84 Corns and callosities: Secondary | ICD-10-CM

## 2013-01-07 NOTE — Progress Notes (Signed)
Subjective: 78 y.o. year old male patient presents complaining of painful nails. Patient requests toe nails, corns and calluses trimmed.   Review of Systems - General ROS: Feel tired all the time. Don't get much sleep.  Ophthalmic ROS: Goes to eye specialist once a month to get shots. ENT ROS: negative Respiratory ROS: Problem with breathing. Gets out of breath easily. Cardiovascular ROS: Has hadd open heart surgery. Ok now. Gastrointestinal ROS: no abdominal pain, change in bowel habits, or black or bloody stools Genito-Urinary ROS: no dysuria, trouble voiding, or hematuria Musculoskeletal ROS: negative  Objective: Dermatologic: Thick yellow deformed nails. Thick callus under 5th MPJ bilateral, painful.  Vascular: Pedal pulses are not palpable. Orthopedic: Rectus foot without gross deformity. Neurologic: All epicritic and tactile sensations grossly intact.  Assessment: Dystrophic mycotic nails x 10. Plantar callus symptomatic and severe right foot under 5th MPJ.  Treatment: All mycotic nails, corns, calluses debrided.  Return as needed.

## 2013-02-08 ENCOUNTER — Ambulatory Visit (INDEPENDENT_AMBULATORY_CARE_PROVIDER_SITE_OTHER): Payer: Medicare Other | Admitting: Podiatry

## 2013-02-08 VITALS — BP 169/76 | HR 82

## 2013-02-08 DIAGNOSIS — L84 Corns and callosities: Secondary | ICD-10-CM

## 2013-02-08 NOTE — Progress Notes (Signed)
Subjective: Patient presents requesting calluses trimmed. Objective: Severe keratotic lesion with pain under 5th MPJ left foot. Right foot pedal pulses are not palpable.. Left foot DP is faintly palpable.  No edema or abnormal lesions observed. Assessment:  Painful callus under 5th MPJ right. Plan:  Feet were soaked in Aloe solution for 15 minutes. All calluses trimmed. Pain was relieved.

## 2013-03-15 ENCOUNTER — Ambulatory Visit (INDEPENDENT_AMBULATORY_CARE_PROVIDER_SITE_OTHER): Payer: Medicare Other | Admitting: Podiatry

## 2013-03-15 DIAGNOSIS — M25579 Pain in unspecified ankle and joints of unspecified foot: Secondary | ICD-10-CM | POA: Insufficient documentation

## 2013-03-15 DIAGNOSIS — L84 Corns and callosities: Secondary | ICD-10-CM | POA: Insufficient documentation

## 2013-03-15 NOTE — Progress Notes (Signed)
Patient presents requesting toe nails and calluses trimmed. Nails were hurting from ingrown nail. This is monthly visit to have corns and calluses trimmed.  Objective: Plantar callus under 1st and 5th MPJ bilateral. Cryptotic nails both great toes.  Assessment: Plantar calluses under 1st and 5th MPJ bilateral. Symptomatic ingrown nails without infection both great toes.  Plan: Palliation prn. Both feet were soaked for 10 minutes. Debrided all nails and calluses.

## 2013-04-01 ENCOUNTER — Telehealth: Payer: Self-pay | Admitting: Internal Medicine

## 2013-04-01 NOTE — Telephone Encounter (Signed)
Spoke with pt and scheduled ov with MW for 04/05/13 at 1:30 pm, advised to seek emergent care sooner if needed

## 2013-04-01 NOTE — Telephone Encounter (Signed)
Spoke with patient:  Scheduled appt 04/12/13 with MW---pt is having more increased SOB and is wanting to know if Dr Sherene Sires thinks he needs to be seen sooner and if there is any way for him to see MW sooner than the 29th. (pt sounded very winded on the phone--not in distress)  Verlon Au please advise if there is an earlier appt with MW or if patient needs to be see TP. Thanks.

## 2013-04-05 ENCOUNTER — Other Ambulatory Visit (INDEPENDENT_AMBULATORY_CARE_PROVIDER_SITE_OTHER): Payer: Medicare Other

## 2013-04-05 ENCOUNTER — Encounter: Payer: Self-pay | Admitting: Internal Medicine

## 2013-04-05 ENCOUNTER — Ambulatory Visit (INDEPENDENT_AMBULATORY_CARE_PROVIDER_SITE_OTHER)
Admission: RE | Admit: 2013-04-05 | Discharge: 2013-04-05 | Disposition: A | Discharge status: Home / Self Care | Payer: Medicare Other | Source: Ambulatory Visit | Attending: Internal Medicine | Admitting: Internal Medicine

## 2013-04-05 ENCOUNTER — Ambulatory Visit (INDEPENDENT_AMBULATORY_CARE_PROVIDER_SITE_OTHER): Payer: Medicare Other | Admitting: Internal Medicine

## 2013-04-05 VITALS — BP 140/62 | HR 90 | Temp 97.1°F | Ht 70.0 in | Wt 223.8 lb

## 2013-04-05 DIAGNOSIS — R0609 Other forms of dyspnea: Secondary | ICD-10-CM

## 2013-04-05 DIAGNOSIS — J449 Chronic obstructive pulmonary disease, unspecified: Secondary | ICD-10-CM

## 2013-04-05 DIAGNOSIS — R0600 Dyspnea, unspecified: Secondary | ICD-10-CM

## 2013-04-05 LAB — BASIC METABOLIC PANEL
BUN: 12 mg/dL (ref 6–23)
CO2: 27 mEq/L (ref 19–32)
Calcium: 9.1 mg/dL (ref 8.4–10.5)
GFR: 58.53 mL/min — ABNORMAL LOW (ref >60.00)
Glucose, Bld: 129 mg/dL — ABNORMAL HIGH (ref 70–99)
Sodium: 139 mEq/L (ref 135–145)

## 2013-04-05 LAB — CBC WITH DIFFERENTIAL/PLATELET
Basophils Relative: 1 % (ref 0.0–3.0)
Eosinophils Relative: 2.8 % (ref 0.0–5.0)
HCT: 41.1 % (ref 39.0–52.0)
Hemoglobin: 14.1 g/dL (ref 13.0–17.0)
Lymphs Abs: 2.7 10*3/uL (ref 0.7–4.0)
MCV: 99.7 fl (ref 78.0–100.0)
Monocytes Absolute: 0.8 10*3/uL (ref 0.1–1.0)
Monocytes Relative: 7.5 % (ref 3.0–12.0)
Neutro Abs: 6.2 10*3/uL (ref 1.4–7.7)
RBC: 4.13 Mil/uL — ABNORMAL LOW (ref 4.22–5.81)
WBC: 10 10*3/uL (ref 4.5–10.5)

## 2013-04-05 MED ORDER — OMEPRAZOLE 20 MG PO CPDR: DELAYED_RELEASE_CAPSULE | ORAL | Status: DC

## 2013-04-05 MED ORDER — FAMOTIDINE 20 MG PO TABS: ORAL_TABLET | ORAL | Status: DC

## 2013-04-05 NOTE — Patient Instructions (Addendum)
Change clonazepam to where you take one in am and 2 at bedtime  Prilosec 20 mg   Take 2   30-60 min before first meal of the day and Pepcid 20 mg one bedtime  X one month trial to see if this helps your breathing  GERD (REFLUX)  is an extremely common cause of respiratory symptoms, many times with no significant heartburn at all.    It can be treated with medication, but also with lifestyle changes including avoidance of late meals, excessive alcohol, smoking cessation, and avoid fatty foods, chocolate, peppermint, colas, red wine, and acidic juices such as orange juice.  NO MINT OR MENTHOL PRODUCTS SO NO COUGH DROPS  USE SUGARLESS CANDY INSTEAD (jolley ranchers or Stover's)  NO OIL BASED VITAMINS - use powdered substitutes.    Please remember to go to the lab and x-ray department downstairs for your tests - we will call you with the results when they are available.

## 2013-04-05 NOTE — Progress Notes (Signed)
Subjective:    Patient ID: Ronald Frank, male    DOB: [birth year]  MRN: [id number]   Brief patient profile:  78 yowm quit smoking 2003 @ CABG @ wt = 185 then gained up peak of 225 with sob x 2007 with pfts 07/2008 GOLD I copd with fev1 over 3liters   May 26, 2008 doe worsening to point where has difficulty with more than 10-15 min gardening more than slow walking going to mailbox no nighttime co's or assoc cough or cp, also trouble from woods to house because of incline that started noticing noticing was difficult in 2008.   July 25, 2008 ov for PFT's no better on spiriva. > wnl> rec clonazepam trial   08/10/2012 ov/ Ronald Frank/ reestablish for dx of copd with sob at rest, also across the room. No better with spiriva, no better with albuterol.  rec Clonazepam 0.5 mg twice daily  Only use ativan if needed for anxiety Prilosec 20 mg proton pump inhibitor and Pepcid 20 mg one at bedtime Stop spiriva   10/04/2012 f/u ov/Ronald Frank cc breathing slt better than baseline  Not using any albuterol or prn ativan rec You do not have significant copd and likely never will You are prone to asthma (so keep your rescue inhaler handy) and the effects of being somewhat overweight via acid reflux GERD diet  04/05/2013 f/u ov/Damyiah Frank  Chief Complaint  Patient presents with  . Acute Visit    Pt c/o DOE progressively worse over the past 6 months. He states gets out of breath just walking from room to room at home and from lobby to exam room today.    better p last ov then ? Out of gerd rx and worse gradually to point where room to room "gives out", no inhalers, taking clonazepam "two daily at bedtime.  No obvious daytime variabilty or assoc chronic cough or cp or chest tightness, subjective wheeze overt sinus or hb symptoms. No unusual exp hx or h/o childhood pna/ asthma or premature birth to his knowledge.   Sleeping ok resp wise without nocturnal  or early am exacerbation  of respiratory  c/o's or need  for noct saba. Also denies any obvious fluctuation of symptoms with weather or environmental changes or other aggravating or alleviating factors except as outlined above   ROS  The following are not active complaints unless bolded sore throat, dysphagia, dental problems, itching, sneezing,  nasal congestion or excess/ purulent secretions, ear ache,   fever, chills, sweats, unintended wt loss, pleuritic or exertional cp, hemoptysis,  orthopnea pnd or leg swelling, presyncope, palpitations, heartburn, abdominal pain, anorexia, nausea, vomiting, diarrhea  or change in bowel or urinary habits, change in stools or urine, dysuria,hematuria,  rash, arthralgias, visual complaints, headache, numbness weakness or ataxia or problems with walking or coordination,  change in mood/affect or memory.       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 Oropharynx no thrush or excess pnd or cobblestoning. No JVD or cervical adenopathy. Mild accessory muscle hypertrophy. Trachea midline, nl thryroid. Chest was min hyperinflated by percussion with diminished breath sounds and mild increased exp time without wheeze. Hoover sign positive at end inspiration. Regular rate and rhythm without murmur gallop or rub or increase P2. Abd: no hsm, nl excursion. Ext warm without Cyanosis or clubbing  07/24/12 cxr  No active disease. Previous CABG. Chronic scarring.  CT 12/15/12 1. Diffuse changes of centrilobular emphysema.   2. No lung infiltrate, pleural effusion, or suspicious lung  nodule. No adenopathy.  CXR  04/05/2013 :  Emphysema without acute disease.  Labs 04/05/13  Nl hc03, nl  BNP, nl cbc       Assessment & Plan:

## 2013-04-06 NOTE — Progress Notes (Signed)
Quick Note:  Spoke with pt and notified of results per Dr. Wert. Pt verbalized understanding and denied any questions.  ______ 

## 2013-04-07 NOTE — Assessment & Plan Note (Signed)
-   08/10/2012   Walked RA x one-half  lap @ 185 stopped due to weak, unsteady, sob but no desat - 04/05/2013   Walked RA x one lap @ 185 stopped due to  Sob, no desat   As I explained to this patient in detail:  although there may be significant copd present, it is relatively mild does not appear to be limiting activity tolerance any more than a set of worn tires limits someone from driving a car  around a parking lot.  A new set of Michelins might look good but would have no perceived impact on the performance of the car and would not be worth the cost.  That is to say:   this pt is so sedentary I don't recommend aggressive pulmonary rx at this point unless limiting symptoms arise or acute exacerbations become as issue, neither of which are the case now.  I asked the patient to contact this office at any time in the future should either of these problems arise.     For now rec focus on rx of anxiety and deconditioning  See instructions for specific recommendations which were reviewed directly with the patient who was given a copy with highlighter outlining the key components.

## 2013-04-07 NOTE — Assessment & Plan Note (Addendum)
  When respiratory symptoms begin or become refractory well after a patient reports complete smoking cessation,  Especially when this wasn't the case while they were smoking, a red flag is raised based on the work of Dr Primitivo Gauze which states:  if you quit smoking when your best day FEV1 is still well preserved it is highly unlikely you will progress to severe disease.  That is to say, once the smoking stops,  the symptoms should not suddenly erupt or markedly worsen.  If so, the differential diagnosis should include  Obesity/deconditioning (the leading suspect here along with anxiety/depression)   LPR/Reflux/Aspiration syndromes ( so he should continue max gerd rx for now),  occult CHF (very unlikely with a BNP <<100) , or  especially side effect of medications commonly used in this population (like ACEi which he did not report using).     His FEV1 is still well in excess of 2 liters and I'm not convinced any of his present decline is attributable to his airways > see dyspnea a/p.

## 2013-04-12 ENCOUNTER — Ambulatory Visit: Payer: Medicare Other | Admitting: Internal Medicine

## 2013-04-25 ENCOUNTER — Ambulatory Visit: Payer: Medicare Other | Admitting: Neurosurgery

## 2013-04-26 ENCOUNTER — Ambulatory Visit: Payer: Medicare Other | Admitting: Neurosurgery

## 2013-04-26 ENCOUNTER — Encounter (INDEPENDENT_AMBULATORY_CARE_PROVIDER_SITE_OTHER): Payer: Medicare Other | Admitting: Vascular Surgery

## 2013-04-26 ENCOUNTER — Ambulatory Visit (INDEPENDENT_AMBULATORY_CARE_PROVIDER_SITE_OTHER): Payer: Medicare Other | Admitting: Vascular Surgery

## 2013-04-26 DIAGNOSIS — Z48812 Encounter for surgical aftercare following surgery on the circulatory system: Secondary | ICD-10-CM

## 2013-04-26 DIAGNOSIS — I70219 Atherosclerosis of native arteries of extremities with intermittent claudication, unspecified extremity: Secondary | ICD-10-CM

## 2013-04-26 DIAGNOSIS — I739 Peripheral vascular disease, unspecified: Secondary | ICD-10-CM

## 2013-05-04 ENCOUNTER — Other Ambulatory Visit: Payer: Self-pay | Admitting: *Deleted

## 2013-05-04 ENCOUNTER — Encounter: Payer: Self-pay | Admitting: Vascular Surgery

## 2013-05-04 DIAGNOSIS — I739 Peripheral vascular disease, unspecified: Secondary | ICD-10-CM

## 2013-05-04 DIAGNOSIS — Z48812 Encounter for surgical aftercare following surgery on the circulatory system: Secondary | ICD-10-CM

## 2013-05-24 ENCOUNTER — Ambulatory Visit: Payer: Medicare Other | Admitting: Podiatry

## 2013-05-31 ENCOUNTER — Ambulatory Visit (INDEPENDENT_AMBULATORY_CARE_PROVIDER_SITE_OTHER): Payer: Medicare Other | Admitting: Podiatry

## 2013-05-31 ENCOUNTER — Encounter: Payer: Self-pay | Admitting: Podiatry

## 2013-05-31 VITALS — BP 152/92 | HR 106 | Ht 70.0 in | Wt 223.0 lb

## 2013-05-31 DIAGNOSIS — M25579 Pain in unspecified ankle and joints of unspecified foot: Secondary | ICD-10-CM

## 2013-05-31 DIAGNOSIS — L84 Corns and callosities: Secondary | ICD-10-CM

## 2013-05-31 NOTE — Patient Instructions (Addendum)
Seen for corns, calluses and thick long toe nails. No unusual lesions noted. Soaked and debrided all. Return as needed.

## 2013-06-01 NOTE — Progress Notes (Signed)
Subjective: 78 year old male patient presents requesting toe nails and calluses trimmed. Nails were hurting from ingrown nail. Calluses are very painful.  Objective: Plantar callus under 1st and 5th MPJ bilateral, painful. Cryptotic nails both great toes.   Assessment: Plantar calluses under 1st and 5th MPJ bilateral.  Symptomatic ingrown nails without infection both great toes.   Plan:  Palliation prn.  Both feet were soaked for 10 minutes.  Debrided all nails and calluses.

## 2013-06-21 ENCOUNTER — Other Ambulatory Visit: Payer: Self-pay | Admitting: *Deleted

## 2013-06-21 MED ORDER — FAMOTIDINE 20 MG PO TABS: ORAL_TABLET | ORAL | Status: DC

## 2013-08-09 ENCOUNTER — Encounter: Payer: Self-pay | Admitting: Podiatry

## 2013-08-09 ENCOUNTER — Ambulatory Visit (INDEPENDENT_AMBULATORY_CARE_PROVIDER_SITE_OTHER): Payer: Medicare Other | Admitting: Podiatry

## 2013-08-09 VITALS — BP 177/85 | HR 92 | Ht 70.0 in | Wt 224.0 lb

## 2013-08-09 DIAGNOSIS — M25579 Pain in unspecified ankle and joints of unspecified foot: Secondary | ICD-10-CM

## 2013-08-09 DIAGNOSIS — L84 Corns and callosities: Secondary | ICD-10-CM

## 2013-08-09 NOTE — Progress Notes (Signed)
Subjective:  78 year old male patient presents requesting toe nails and calluses trimmed. Calluses are very thick and painful.   Objective: Plantar callus under 1st and 5th MPJ bilateral, painful.  No open skin lesions.   Assessment: Plantar calluses under 1st and 5th MPJ bilateral.   Plan:  Palliation prn.  Both feet were soaked for 10 minutes.  Debrided all nails and calluses.

## 2013-08-09 NOTE — Patient Instructions (Signed)
Seen for hypertrophic corns, calluses and nails. All nails, corns and calluses debrided. Return as needed.

## 2013-10-26 ENCOUNTER — Ambulatory Visit (INDEPENDENT_AMBULATORY_CARE_PROVIDER_SITE_OTHER): Payer: Medicare Other | Admitting: Podiatry

## 2013-10-26 ENCOUNTER — Other Ambulatory Visit: Payer: Self-pay

## 2013-10-26 ENCOUNTER — Encounter: Payer: Self-pay | Admitting: Podiatry

## 2013-10-26 DIAGNOSIS — L84 Corns and callosities: Secondary | ICD-10-CM

## 2013-10-26 DIAGNOSIS — M25579 Pain in unspecified ankle and joints of unspecified foot: Secondary | ICD-10-CM

## 2013-10-26 MED ORDER — FAMOTIDINE 20 MG PO TABS: ORAL_TABLET | ORAL | Status: DC

## 2013-10-26 NOTE — Patient Instructions (Signed)
Seen for hypertrophic calluses All calluses debrided. Return in 10 weeks or as needed.  

## 2013-10-26 NOTE — Progress Notes (Signed)
Seen for painful calluses. No new problems. Pre ulcerative calluses under first and 5th MPJ bilateral.  Both feet soaked for 15 minutes. All calluses debrided.

## 2013-10-27 ENCOUNTER — Other Ambulatory Visit: Payer: Self-pay | Admitting: Internal Medicine

## 2013-10-27 MED ORDER — FAMOTIDINE 20 MG PO TABS: ORAL_TABLET | ORAL | Status: DC

## 2013-11-28 ENCOUNTER — Emergency Department (HOSPITAL_COMMUNITY): Payer: Medicare Other

## 2013-11-28 ENCOUNTER — Encounter (HOSPITAL_COMMUNITY): Payer: Self-pay | Admitting: Emergency Medicine

## 2013-11-28 ENCOUNTER — Inpatient Hospital Stay (HOSPITAL_COMMUNITY)
Admission: EM | Admit: 2013-11-28 | Discharge: 2013-12-10 | DRG: 004 | Disposition: A | Discharge status: Other Acute Inpatient Hospital | Payer: Medicare Other | Attending: Pulmonary Disease | Admitting: Pulmonary Disease

## 2013-11-28 DIAGNOSIS — Z930 Tracheostomy status: Secondary | ICD-10-CM

## 2013-11-28 DIAGNOSIS — N289 Disorder of kidney and ureter, unspecified: Secondary | ICD-10-CM

## 2013-11-28 DIAGNOSIS — M25559 Pain in unspecified hip: Secondary | ICD-10-CM | POA: Diagnosis present

## 2013-11-28 DIAGNOSIS — Z96649 Presence of unspecified artificial hip joint: Secondary | ICD-10-CM

## 2013-11-28 DIAGNOSIS — F410 Panic disorder [episodic paroxysmal anxiety] without agoraphobia: Secondary | ICD-10-CM | POA: Diagnosis not present

## 2013-11-28 DIAGNOSIS — I4510 Unspecified right bundle-branch block: Secondary | ICD-10-CM | POA: Diagnosis present

## 2013-11-28 DIAGNOSIS — W010XXA Fall on same level from slipping, tripping and stumbling without subsequent striking against object, initial encounter: Secondary | ICD-10-CM | POA: Diagnosis present

## 2013-11-28 DIAGNOSIS — IMO0002 Reserved for concepts with insufficient information to code with codable children: Secondary | ICD-10-CM | POA: Diagnosis present

## 2013-11-28 DIAGNOSIS — I252 Old myocardial infarction: Secondary | ICD-10-CM

## 2013-11-28 DIAGNOSIS — E785 Hyperlipidemia, unspecified: Secondary | ICD-10-CM

## 2013-11-28 DIAGNOSIS — D649 Anemia, unspecified: Secondary | ICD-10-CM | POA: Diagnosis present

## 2013-11-28 DIAGNOSIS — I739 Peripheral vascular disease, unspecified: Secondary | ICD-10-CM

## 2013-11-28 DIAGNOSIS — E872 Acidosis, unspecified: Secondary | ICD-10-CM | POA: Diagnosis not present

## 2013-11-28 DIAGNOSIS — I10 Essential (primary) hypertension: Secondary | ICD-10-CM | POA: Diagnosis present

## 2013-11-28 DIAGNOSIS — R6521 Severe sepsis with septic shock: Secondary | ICD-10-CM | POA: Diagnosis not present

## 2013-11-28 DIAGNOSIS — I4891 Unspecified atrial fibrillation: Secondary | ICD-10-CM | POA: Diagnosis not present

## 2013-11-28 DIAGNOSIS — E875 Hyperkalemia: Secondary | ICD-10-CM | POA: Diagnosis not present

## 2013-11-28 DIAGNOSIS — M129 Arthropathy, unspecified: Secondary | ICD-10-CM | POA: Diagnosis present

## 2013-11-28 DIAGNOSIS — J80 Acute respiratory distress syndrome: Secondary | ICD-10-CM

## 2013-11-28 DIAGNOSIS — G9340 Encephalopathy, unspecified: Secondary | ICD-10-CM | POA: Diagnosis not present

## 2013-11-28 DIAGNOSIS — E119 Type 2 diabetes mellitus without complications: Secondary | ICD-10-CM | POA: Diagnosis present

## 2013-11-28 DIAGNOSIS — J449 Chronic obstructive pulmonary disease, unspecified: Secondary | ICD-10-CM | POA: Diagnosis present

## 2013-11-28 DIAGNOSIS — Z951 Presence of aortocoronary bypass graft: Secondary | ICD-10-CM | POA: Diagnosis not present

## 2013-11-28 DIAGNOSIS — R262 Difficulty in walking, not elsewhere classified: Secondary | ICD-10-CM | POA: Diagnosis present

## 2013-11-28 DIAGNOSIS — F411 Generalized anxiety disorder: Secondary | ICD-10-CM | POA: Diagnosis present

## 2013-11-28 DIAGNOSIS — Z79899 Other long term (current) drug therapy: Secondary | ICD-10-CM

## 2013-11-28 DIAGNOSIS — E876 Hypokalemia: Secondary | ICD-10-CM | POA: Diagnosis not present

## 2013-11-28 DIAGNOSIS — N179 Acute kidney failure, unspecified: Secondary | ICD-10-CM

## 2013-11-28 DIAGNOSIS — E860 Dehydration: Secondary | ICD-10-CM | POA: Diagnosis present

## 2013-11-28 DIAGNOSIS — I2510 Atherosclerotic heart disease of native coronary artery without angina pectoris: Secondary | ICD-10-CM | POA: Diagnosis present

## 2013-11-28 DIAGNOSIS — I4892 Unspecified atrial flutter: Secondary | ICD-10-CM | POA: Diagnosis not present

## 2013-11-28 DIAGNOSIS — D72829 Elevated white blood cell count, unspecified: Secondary | ICD-10-CM

## 2013-11-28 DIAGNOSIS — S7002XA Contusion of left hip, initial encounter: Secondary | ICD-10-CM

## 2013-11-28 DIAGNOSIS — Z87891 Personal history of nicotine dependence: Secondary | ICD-10-CM | POA: Diagnosis not present

## 2013-11-28 DIAGNOSIS — J15211 Pneumonia due to Methicillin susceptible Staphylococcus aureus: Secondary | ICD-10-CM | POA: Diagnosis not present

## 2013-11-28 DIAGNOSIS — J9601 Acute respiratory failure with hypoxia: Secondary | ICD-10-CM

## 2013-11-28 DIAGNOSIS — J9600 Acute respiratory failure, unspecified whether with hypoxia or hypercapnia: Secondary | ICD-10-CM | POA: Diagnosis not present

## 2013-11-28 DIAGNOSIS — A419 Sepsis, unspecified organism: Secondary | ICD-10-CM

## 2013-11-28 DIAGNOSIS — Z9911 Dependence on respirator [ventilator] status: Secondary | ICD-10-CM

## 2013-11-28 DIAGNOSIS — I498 Other specified cardiac arrhythmias: Secondary | ICD-10-CM | POA: Diagnosis present

## 2013-11-28 DIAGNOSIS — N170 Acute kidney failure with tubular necrosis: Secondary | ICD-10-CM | POA: Diagnosis present

## 2013-11-28 DIAGNOSIS — Y92009 Unspecified place in unspecified non-institutional (private) residence as the place of occurrence of the external cause: Secondary | ICD-10-CM

## 2013-11-28 DIAGNOSIS — K219 Gastro-esophageal reflux disease without esophagitis: Secondary | ICD-10-CM | POA: Diagnosis present

## 2013-11-28 DIAGNOSIS — Z6830 Body mass index (BMI) 30.0-30.9, adult: Secondary | ICD-10-CM | POA: Diagnosis not present

## 2013-11-28 DIAGNOSIS — S7000XA Contusion of unspecified hip, initial encounter: Secondary | ICD-10-CM | POA: Diagnosis present

## 2013-11-28 DIAGNOSIS — R404 Transient alteration of awareness: Secondary | ICD-10-CM | POA: Diagnosis present

## 2013-11-28 DIAGNOSIS — M25552 Pain in left hip: Secondary | ICD-10-CM | POA: Diagnosis present

## 2013-11-28 DIAGNOSIS — I639 Cerebral infarction, unspecified: Secondary | ICD-10-CM

## 2013-11-28 DIAGNOSIS — Z8673 Personal history of transient ischemic attack (TIA), and cerebral infarction without residual deficits: Secondary | ICD-10-CM

## 2013-11-28 DIAGNOSIS — E870 Hyperosmolality and hypernatremia: Secondary | ICD-10-CM

## 2013-11-28 DIAGNOSIS — J1520 Pneumonia due to staphylococcus, unspecified: Secondary | ICD-10-CM

## 2013-11-28 DIAGNOSIS — R197 Diarrhea, unspecified: Secondary | ICD-10-CM | POA: Diagnosis not present

## 2013-11-28 DIAGNOSIS — R6520 Severe sepsis without septic shock: Secondary | ICD-10-CM | POA: Diagnosis present

## 2013-11-28 DIAGNOSIS — R579 Shock, unspecified: Secondary | ICD-10-CM | POA: Diagnosis not present

## 2013-11-28 DIAGNOSIS — J4489 Other specified chronic obstructive pulmonary disease: Secondary | ICD-10-CM | POA: Diagnosis present

## 2013-11-28 LAB — URINALYSIS, ROUTINE W REFLEX MICROSCOPIC
Bilirubin Urine: NEGATIVE
GLUCOSE, UA: NEGATIVE mg/dL
Hgb urine dipstick: NEGATIVE
KETONES UR: 15 mg/dL — AB
Nitrite: NEGATIVE
PH: 5 (ref 5.0–8.0)
Protein, ur: 30 mg/dL — AB
SPECIFIC GRAVITY, URINE: 1.038 — AB (ref 1.005–1.030)
Urobilinogen, UA: 1 mg/dL (ref 0.0–1.0)

## 2013-11-28 LAB — CBC WITH DIFFERENTIAL/PLATELET
Basophils Absolute: 0 10*3/uL (ref 0.0–0.1)
Basophils Relative: 0 % (ref 0–1)
Eosinophils Absolute: 0 10*3/uL (ref 0.0–0.7)
Eosinophils Relative: 0 % (ref 0–5)
HEMATOCRIT: 34.7 % — AB (ref 39.0–52.0)
HEMOGLOBIN: 11.8 g/dL — AB (ref 13.0–17.0)
LYMPHS PCT: 11 % — AB (ref 12–46)
Lymphs Abs: 1.3 10*3/uL (ref 0.7–4.0)
MCH: 33.3 pg (ref 26.0–34.0)
MCHC: 34 g/dL (ref 30.0–36.0)
MCV: 98 fL (ref 78.0–100.0)
MONOS PCT: 5 % (ref 3–12)
Monocytes Absolute: 0.6 10*3/uL (ref 0.1–1.0)
NEUTROS ABS: 9.3 10*3/uL — AB (ref 1.7–7.7)
Neutrophils Relative %: 83 % — ABNORMAL HIGH (ref 43–77)
Platelets: 209 10*3/uL (ref 150–400)
RBC: 3.54 MIL/uL — AB (ref 4.22–5.81)
RDW: 14.6 % (ref 11.5–15.5)
WBC: 11.2 10*3/uL — AB (ref 4.0–10.5)

## 2013-11-28 LAB — URINE MICROSCOPIC-ADD ON

## 2013-11-28 LAB — BASIC METABOLIC PANEL
BUN: 34 mg/dL — AB (ref 6–23)
CHLORIDE: 96 meq/L (ref 96–112)
CO2: 22 meq/L (ref 19–32)
CREATININE: 1.96 mg/dL — AB (ref 0.50–1.35)
Calcium: 8.7 mg/dL (ref 8.4–10.5)
GFR calc Af Amer: 36 mL/min — ABNORMAL LOW (ref >90)
GFR calc non Af Amer: 31 mL/min — ABNORMAL LOW (ref >90)
Glucose, Bld: 130 mg/dL — ABNORMAL HIGH (ref 70–99)
POTASSIUM: 4.2 meq/L (ref 3.7–5.3)
Sodium: 136 mEq/L — ABNORMAL LOW (ref 137–147)

## 2013-11-28 LAB — CBG MONITORING, ED: Glucose-Capillary: 100 mg/dL — ABNORMAL HIGH (ref 70–99)

## 2013-11-28 MED ORDER — ACETAMINOPHEN 650 MG RE SUPP: 650.0000 mg | Freq: Four times a day (QID) | RECTAL | Status: DC | PRN

## 2013-11-28 MED ORDER — METOPROLOL SUCCINATE 12.5 MG HALF TABLET
12.5000 mg | ORAL_TABLET | Freq: Every day | ORAL | Status: DC
  Administered 2013-11-29: 12.5 mg via ORAL
  Filled 2013-11-28: qty 1

## 2013-11-28 MED ORDER — ASPIRIN EC 81 MG PO TBEC
81.0000 mg | DELAYED_RELEASE_TABLET | Freq: Every day | ORAL | Status: DC
  Administered 2013-11-29: 81 mg via ORAL
  Filled 2013-11-28 (×2): qty 1

## 2013-11-28 MED ORDER — SODIUM CHLORIDE 0.9 % IV SOLN
INTRAVENOUS | Status: DC
  Administered 2013-11-28: 23:00:00 via INTRAVENOUS

## 2013-11-28 MED ORDER — VITAMIN D (ERGOCALCIFEROL) 1.25 MG (50000 UNIT) PO CAPS: 50000.0000 [IU] | ORAL_CAPSULE | ORAL | Status: DC

## 2013-11-28 MED ORDER — ACETAMINOPHEN 325 MG PO TABS
650.0000 mg | ORAL_TABLET | Freq: Once | ORAL | Status: AC
  Administered 2013-11-28: 650 mg via ORAL
  Filled 2013-11-28: qty 2

## 2013-11-28 MED ORDER — NITROGLYCERIN 0.4 MG SL SUBL: 0.4000 mg | SUBLINGUAL_TABLET | SUBLINGUAL | Status: DC | PRN

## 2013-11-28 MED ORDER — DIPHENHYDRAMINE HCL 25 MG PO CAPS: 25.0000 mg | ORAL_CAPSULE | Freq: Every evening | ORAL | Status: DC | PRN

## 2013-11-28 MED ORDER — PANTOPRAZOLE SODIUM 40 MG PO TBEC
40.0000 mg | DELAYED_RELEASE_TABLET | Freq: Every day | ORAL | Status: DC
  Administered 2013-11-29: 40 mg via ORAL
  Filled 2013-11-28: qty 1

## 2013-11-28 MED ORDER — ONDANSETRON HCL 4 MG PO TABS: 4.0000 mg | ORAL_TABLET | Freq: Four times a day (QID) | ORAL | Status: DC | PRN

## 2013-11-28 MED ORDER — CLOPIDOGREL BISULFATE 75 MG PO TABS
75.0000 mg | ORAL_TABLET | Freq: Every day | ORAL | Status: DC
  Administered 2013-11-29 – 2013-12-06 (×8): 75 mg via ORAL
  Filled 2013-11-28 (×11): qty 1

## 2013-11-28 MED ORDER — ROSUVASTATIN CALCIUM 20 MG PO TABS
20.0000 mg | ORAL_TABLET | Freq: Every day | ORAL | Status: DC
  Administered 2013-11-30 – 2013-12-05 (×6): 20 mg via ORAL
  Filled 2013-11-28 (×8): qty 1

## 2013-11-28 MED ORDER — HYDROCODONE-ACETAMINOPHEN 5-325 MG PO TABS
1.0000 | ORAL_TABLET | Freq: Four times a day (QID) | ORAL | Status: DC | PRN
  Administered 2013-11-28 – 2013-11-29 (×2): 1 via ORAL
  Filled 2013-11-28 (×2): qty 1

## 2013-11-28 MED ORDER — ZOLPIDEM TARTRATE 5 MG PO TABS
5.0000 mg | ORAL_TABLET | Freq: Every day | ORAL | Status: DC
Start: 2013-11-28 — End: 2013-11-29
  Administered 2013-11-28: 5 mg via ORAL
  Filled 2013-11-28: qty 1

## 2013-11-28 MED ORDER — ONDANSETRON HCL 4 MG/2ML IJ SOLN: 4.0000 mg | Freq: Four times a day (QID) | INTRAMUSCULAR | Status: DC | PRN

## 2013-11-28 MED ORDER — OXYCODONE HCL 5 MG PO TABS
5.0000 mg | ORAL_TABLET | ORAL | Status: DC | PRN
  Administered 2013-11-29 (×2): 5 mg via ORAL
  Filled 2013-11-28 (×2): qty 1

## 2013-11-28 MED ORDER — SODIUM CHLORIDE 0.9 % IV BOLUS (SEPSIS)
500.0000 mL | INTRAVENOUS | Status: AC
  Administered 2013-11-28: 500 mL via INTRAVENOUS

## 2013-11-28 MED ORDER — METHOCARBAMOL 500 MG PO TABS: 500.0000 mg | ORAL_TABLET | Freq: Two times a day (BID) | ORAL | Status: DC | PRN

## 2013-11-28 MED ORDER — CLONAZEPAM 0.5 MG PO TABS
0.5000 mg | ORAL_TABLET | Freq: Two times a day (BID) | ORAL | Status: DC | PRN
  Administered 2013-11-29: 0.5 mg via ORAL
  Filled 2013-11-28: qty 1

## 2013-11-28 MED ORDER — VENLAFAXINE HCL ER 75 MG PO CP24
75.0000 mg | ORAL_CAPSULE | Freq: Every day | ORAL | Status: DC
  Administered 2013-11-28: 75 mg via ORAL
  Filled 2013-11-28 (×2): qty 1

## 2013-11-28 MED ORDER — ALBUTEROL SULFATE (2.5 MG/3ML) 0.083% IN NEBU: 2.5000 mg | INHALATION_SOLUTION | Freq: Four times a day (QID) | RESPIRATORY_TRACT | Status: DC | PRN

## 2013-11-28 MED ORDER — LORATADINE 10 MG PO TABS
10.0000 mg | ORAL_TABLET | Freq: Every day | ORAL | Status: DC
  Administered 2013-11-29: 10 mg via ORAL
  Filled 2013-11-28: qty 1

## 2013-11-28 MED ORDER — VITAMIN C 500 MG PO TABS
500.0000 mg | ORAL_TABLET | Freq: Every day | ORAL | Status: DC
  Administered 2013-11-29: 500 mg via ORAL
  Filled 2013-11-28: qty 1

## 2013-11-28 MED ORDER — ACETAMINOPHEN 325 MG PO TABS: 650.0000 mg | ORAL_TABLET | Freq: Four times a day (QID) | ORAL | Status: DC | PRN

## 2013-11-28 MED ORDER — TAMSULOSIN HCL 0.4 MG PO CAPS
0.4000 mg | ORAL_CAPSULE | Freq: Every day | ORAL | Status: DC
  Administered 2013-11-29: 0.4 mg via ORAL
  Filled 2013-11-28: qty 1

## 2013-11-28 MED ORDER — INSULIN ASPART 100 UNIT/ML ~~LOC~~ SOLN
0.0000 [IU] | Freq: Three times a day (TID) | SUBCUTANEOUS | Status: DC
  Administered 2013-11-29 (×2): 1 [IU] via SUBCUTANEOUS

## 2013-11-28 MED ORDER — OXYCODONE-ACETAMINOPHEN 5-325 MG PO TABS
1.0000 | ORAL_TABLET | Freq: Once | ORAL | Status: AC
  Administered 2013-11-28: 1 via ORAL
  Filled 2013-11-28: qty 1

## 2013-11-28 MED ORDER — EZETIMIBE 10 MG PO TABS
10.0000 mg | ORAL_TABLET | Freq: Every day | ORAL | Status: DC
  Administered 2013-11-29 – 2013-12-05 (×7): 10 mg via ORAL
  Filled 2013-11-28 (×8): qty 1

## 2013-11-28 MED ORDER — FENTANYL CITRATE 0.05 MG/ML IJ SOLN
25.0000 ug | INTRAMUSCULAR | Status: DC | PRN
  Administered 2013-11-29: 25 ug via INTRAVENOUS
  Filled 2013-11-28: qty 2

## 2013-11-28 NOTE — ED Notes (Signed)
Patient taken to CT and Xray 

## 2013-11-28 NOTE — H&P (Addendum)
[facility] History and Physical  Ronald Frank ZOX:[id number] DOB: 28-Jul-1935 DOA: 11/28/2013  Referring physician: ER physician. PCP: Junious Silk, MD   Chief Complaint: Fall with left hip pain.  HPI: Ronald Frank is a 79 y.o. male history of diabetes mellitus, COPD, hypertension, hyperlipidemia, previous stroke had a fall at his house after slipping. Since then patient has been having left hip pain which has progressively worsened. Patient's pain is increased on moving his left hip. Denies any loss of consciousness chest pain palpitations shortness of breath. He did hit his head but did not lose consciousness. CT head did not show anything acute. Since his hip pain has been worsening and x-ray did not show any fractures CT scan of the hips was done which was negative for anything acute. Patient has been admitted since patient is unable to walk due to pain.   Review of Systems: As presented in the history of presenting illness, rest negative.  Past Medical History  Diagnosis Date  . COPD (chronic obstructive pulmonary disease)   . Hyperlipidemia   . Myocardial infarction 2003  . Arthritis   . Joint pain   . Bronchitis   . Productive cough   . Anxiety   . Diabetes mellitus without complication   . Stroke   . GERD (gastroesophageal reflux disease)    Past Surgical History  Procedure Laterality Date  . Total hip arthroplasty  2005  . Coronary artery bypass graft  2003  . Femoral artery - femoral artery bypass graft  2003    right to left  . Lumbar disc surgery      x2 1999 & 2000  . Aortagram  01/20/2012    Abdominal Aortagram  . Rhae Hammock without cardioversion  07/27/2012    Procedure: TRANSESOPHAGEAL ECHOCARDIOGRAM (TEE);  Surgeon: Lewayne Bunting, MD;  Location: [facility] ENDOSCOPY;  Service: Cardiovascular;  Laterality: N/A;   Social History:  reports that he quit smoking about 12 years ago. His smoking use included Cigarettes. He smoked 0.00 packs per day. He has never  used smokeless tobacco. He reports that he does not drink alcohol or use illicit drugs. Where does patient live home. Can patient participate in ADLs? Yes.  Allergies  Allergen Reactions  . Actos [Pioglitazone Hydrochloride] Other (See Comments)    unkknown  . Erythromycin Hives and Itching  . Escitalopram Oxalate Hives and Itching  . Lipitor [Atorvastatin Calcium] Other (See Comments)    Weakness   . Omnipaque [Iohexol] Other (See Comments)    unknown  . Pletaal [Cilostazol] Other (See Comments)    unknown    Family History: History reviewed. No pertinent family history.    Prior to Admission medications   Medication Sig Start Date End Date Taking? Authorizing Provider  aspirin EC 81 MG tablet Take 81 mg by mouth daily.     Yes Historical Provider, MD  clonazePAM (KLONOPIN) 0.5 MG tablet Take 0.5 mg by mouth 2 (two) times daily as needed for anxiety.   Yes Historical Provider, MD  clopidogrel (PLAVIX) 75 MG tablet Take 75 mg by mouth daily.   Yes Historical Provider, MD  diphenhydrAMINE (BENADRYL) 25 MG tablet Take 25 mg by mouth at bedtime as needed for sleep.   Yes Historical Provider, MD  ezetimibe (ZETIA) 10 MG tablet Take 10 mg by mouth daily.     Yes Historical Provider, MD  fexofenadine (ALLEGRA) 180 MG tablet Take 180 mg by mouth daily.   Yes Historical Provider, MD  HYDROcodone-acetaminophen (NORCO/VICODIN) 5-325  MG per tablet Take 1 tablet by mouth every 6 (six) hours as needed for pain.   Yes Historical Provider, MD  LORazepam (ATIVAN) 0.5 MG tablet Take 0.5 mg by mouth 2 (two) times daily as needed for anxiety.    Yes Historical Provider, MD  metFORMIN (GLUCOPHAGE) 500 MG tablet Take 500 mg by mouth 2 (two) times daily with a meal.     Yes Historical Provider, MD  methocarbamol (ROBAXIN) 500 MG tablet Take 500 mg by mouth 2 (two) times daily as needed for muscle spasms.  03/16/12  Yes Historical Provider, MD  metoprolol succinate (TOPROL-XL) 25 MG 24 hr tablet Take 12.5 mg  by mouth daily.   Yes Historical Provider, MD  Multiple Vitamins-Minerals (EYE VITAMINS PO) Take 1 tablet by mouth 2 (two) times daily.    Yes Historical Provider, MD  nitroGLYCERIN (NITROSTAT) 0.4 MG SL tablet Place 0.4 mg under the tongue every 5 (five) minutes as needed for chest pain.   Yes Historical Provider, MD  omeprazole (PRILOSEC) 20 MG capsule Take 20 mg by mouth 2 (two) times daily before a meal.   Yes Historical Provider, MD  rosuvastatin (CRESTOR) 40 MG tablet Take 20 mg by mouth daily.    Yes Historical Provider, MD  Tamsulosin HCl (FLOMAX) 0.4 MG CAPS Take 0.4 mg by mouth daily.    Yes Historical Provider, MD  venlafaxine XR (EFFEXOR-XR) 75 MG 24 hr capsule Take 75 mg by mouth at bedtime.    Yes Historical Provider, MD  vitamin C (ASCORBIC ACID) 500 MG tablet Take 500 mg by mouth daily.     Yes Historical Provider, MD  Vitamin D, Ergocalciferol, (DRISDOL) 50000 UNITS CAPS Take 50,000 Units by mouth every 7 (seven) days. On Fridays   Yes Historical Provider, MD  zolpidem (AMBIEN) 10 MG tablet Take 10 mg by mouth at bedtime.    Yes Historical Provider, MD  albuterol (PROVENTIL HFA;VENTOLIN HFA) 108 (90 BASE) MCG/ACT inhaler Inhale 2 puffs into the lungs every 6 (six) hours as needed. For wheezing 10/23/11 10/22/12  Nyoka Cowden, MD    Physical Exam: Filed Vitals:   11/28/13 2020 11/28/13 2021 11/28/13 2130 11/28/13 2145  BP: 141/64  116/56   Pulse:  107  105  Temp:      TempSrc:      Resp:      Height:      Weight:      SpO2:  93%  93%     General:  Well-developed and nourished.  Eyes: Anicteric no pallor. Small swelling of the left periorbital area.  ENT: No discharge from ears eyes nose mouth.  Neck: No mass felt.  Cardiovascular: S1-S2 heard.  Respiratory: No rhonchi or crepitations.  Abdomen: Soft nontender bowel sounds present.  Skin: Small swelling of the left periorbital area.  Musculoskeletal: Increased pain on moving his left hip.  Psychiatric:  Appears normal.  Neurologic: Alert awake oriented to time place and person. Moves all extremities.  Labs on Admission:  Basic Metabolic Panel:  Recent Labs Lab 11/28/13 1635  NA 136*  K 4.2  CL 96  CO2 22  GLUCOSE 130*  BUN 34*  CREATININE 1.96*  CALCIUM 8.7   Liver Function Tests: No results found for this basename: AST, ALT, ALKPHOS, BILITOT, PROT, ALBUMIN,  in the last 168 hours No results found for this basename: LIPASE, AMYLASE,  in the last 168 hours No results found for this basename: AMMONIA,  in the last 168 hours CBC:  Recent Labs Lab 11/28/13 1635  WBC 11.2*  NEUTROABS 9.3*  HGB 11.8*  HCT 34.7*  MCV 98.0  PLT 209   Cardiac Enzymes: No results found for this basename: CKTOTAL, CKMB, CKMBINDEX, TROPONINI,  in the last 168 hours  BNP (last 3 results)  Recent Labs  04/05/13 1355  PROBNP 27.0   CBG:  Recent Labs Lab 11/28/13 1831  GLUCAP 100*    Radiological Exams on Admission: Dg Chest 2 View  11/28/2013   CLINICAL DATA:  Fall.  Productive cough for 1 week.  EXAM: CHEST  2 VIEW  COMPARISON:  04/05/2013  FINDINGS: Sequelae of prior CABG are again identified. The cardiomediastinal silhouette is within normal limits. There is increased peribronchial thickening. Lungs remain hyperinflated. No pleural effusion or pneumothorax is identified. No segmental airspace consolidation is seen. No acute osseous abnormality is identified.  IMPRESSION: Peribronchial thickening, query bronchitis.   Electronically Signed   By: Sebastian AcheAllen  Grady   On: 11/28/2013 17:49   Dg Hip Complete Left  11/28/2013   CLINICAL DATA:  Left hip pain  EXAM: LEFT HIP - COMPLETE 2+ VIEW  COMPARISON:  None.  FINDINGS: The patient is status post left hip arthroplasty. There is no acute fracture or subluxation identified. Mild to moderate degenerative changes involve the right hip. There is no evidence of hip fracture or dislocation. There is no evidence of arthropathy or other focal bone  abnormality.  IMPRESSION: 1. No acute findings. 2. Previous left hip arthroplasty.   Electronically Signed   By: Signa Kellaylor  Stroud M.D.   On: 11/28/2013 17:36   Ct Head Wo Contrast  11/28/2013   CLINICAL DATA:  Larey SeatFell while walking, dizziness, abrasion to left eyebrow  EXAM: CT HEAD WITHOUT CONTRAST  CT CERVICAL SPINE WITHOUT CONTRAST  TECHNIQUE: Multidetector CT imaging of the head and cervical spine was performed following the standard protocol without intravenous contrast. Multiplanar CT image reconstructions of the cervical spine were also generated.  COMPARISON:  CT head 07/24/2012  FINDINGS: CT HEAD FINDINGS  Motion artifacts, for which repeat imaging was performed.  Generalized atrophy.  Normal ventricular morphology.  No midline shift or mass effect.  Small vessel chronic ischemic changes of deep cerebral white matter.  No intracranial hemorrhage, mass lesion, or acute infarction.  Scattered mucosal thickening in paranasal sinuses.  Calvaria intact.  Atherosclerotic calcifications the carotid siphons.  Bones unremarkable.  CT CERVICAL SPINE FINDINGS  Extensive carotid atherosclerotic calcifications most prominent at bifurcations.  Prevertebral soft tissues normal thickness.  Diffuse osseous demineralization.  Tips of lung apices clear.  Visualized skullbase intact.  Incomplete posterior arch C1, normal variant.  3.6 mm retrolisthesis at C3-C4.  Minimal anterolisthesis at C4-C5.  Scattered degenerative disc disease changes greatest at C3-C4 and C5-C6.  Vertebral body heights maintained without fracture or bone destruction.  Multilevel facet degenerative changes bilaterally.  IMPRESSION: Atrophy with small vessel chronic ischemic changes of deep cerebral white matter.  No acute intracranial abnormalities.  Multilevel degenerative disc and facet disease changes cervical spine as above.  Extensive carotid vascular calcification.  No acute cervical spine abnormalities.   Electronically Signed   By: Ulyses SouthwardMark  Boles M.D.    On: 11/28/2013 17:18   Ct Cervical Spine Wo Contrast  11/28/2013   CLINICAL DATA:  Larey SeatFell while walking, dizziness, abrasion to left eyebrow  EXAM: CT HEAD WITHOUT CONTRAST  CT CERVICAL SPINE WITHOUT CONTRAST  TECHNIQUE: Multidetector CT imaging of the head and cervical spine was performed following the standard protocol without intravenous contrast.  Multiplanar CT image reconstructions of the cervical spine were also generated.  COMPARISON:  CT head 07/24/2012  FINDINGS: CT HEAD FINDINGS  Motion artifacts, for which repeat imaging was performed.  Generalized atrophy.  Normal ventricular morphology.  No midline shift or mass effect.  Small vessel chronic ischemic changes of deep cerebral white matter.  No intracranial hemorrhage, mass lesion, or acute infarction.  Scattered mucosal thickening in paranasal sinuses.  Calvaria intact.  Atherosclerotic calcifications the carotid siphons.  Bones unremarkable.  CT CERVICAL SPINE FINDINGS  Extensive carotid atherosclerotic calcifications most prominent at bifurcations.  Prevertebral soft tissues normal thickness.  Diffuse osseous demineralization.  Tips of lung apices clear.  Visualized skullbase intact.  Incomplete posterior arch C1, normal variant.  3.6 mm retrolisthesis at C3-C4.  Minimal anterolisthesis at C4-C5.  Scattered degenerative disc disease changes greatest at C3-C4 and C5-C6.  Vertebral body heights maintained without fracture or bone destruction.  Multilevel facet degenerative changes bilaterally.  IMPRESSION: Atrophy with small vessel chronic ischemic changes of deep cerebral white matter.  No acute intracranial abnormalities.  Multilevel degenerative disc and facet disease changes cervical spine as above.  Extensive carotid vascular calcification.  No acute cervical spine abnormalities.   Electronically Signed   By: Ulyses Southward M.D.   On: 11/28/2013 17:18   Ct Hip Left Wo Contrast  11/28/2013   CLINICAL DATA:  Fall.  Left hip pain appear  EXAM: CT OF  THE LEFT HIP WITHOUT CONTRAST  TECHNIQUE: Multidetector CT imaging was performed according to the standard protocol. Multiplanar CT image reconstructions were also generated.  COMPARISON:  DG HIP COMPLETE*L* dated 11/28/2013  FINDINGS: Patient status post total left hip replacement. Good anatomic alignment noted. No evidence of loosening. No acute bony abnormality. No evidence of fracture. Atherosclerotic vascular calcification of the iliofemoral vessels.  IMPRESSION: 1. No acute bony abnormality. No evidence of fracture all dislocation. 2. Patient a prior total left hip replacement with good anatomic alignment.   Electronically Signed   By: Maisie Fus  Register   On: 11/28/2013 19:37    EKG: Independently reviewed. Sinus tachycardia with RBBB.  Assessment/Plan Principal Problem:   Contusion of left hip Active Problems:   HTN (hypertension)   DM (diabetes mellitus)   CAD (coronary artery disease)   Acute renal failure   Left hip pain   1. Left hip contusion status post mechanical fall - since patient still has significant pain I have ordered MRI of the left hip. Physical therapy consult. Pain relief medications. 2. Acute renal failure - probably from dehydration. Patient has been placed on gentle hydration. Closely follow intake output and metabolic panel. UA is unremarkable. 3. Diabetes mellitus type 2 - patient has been placed on sliding-scale coverage. Hold metformin due to renal failure. 4. CAD status post CABG - denies any chest pain. 5. History of CVA. 6. COPD - presently not wheezing. 7. Anemia - follow CBC.  I have reviewed patient's old charts and labs.  Code Status: Full code.  Family Communication: Family at the bedside.  Disposition Plan: Admit to inpatient.    Delcie Ruppert N. [facility] Pager [phone].  If 7PM-7AM, please contact night-coverage [email or web address] Password [facility] 11/28/2013, 10:42 PM

## 2013-11-28 NOTE — ED Provider Notes (Signed)
CSN: [id number]     Arrival date & time 11/28/13  1609 History   First MD Initiated Contact with Patient 11/28/13 1611     Chief Complaint  Patient presents with  . Fall     (Consider location/radiation/quality/duration/timing/severity/associated sxs/prior Treatment) Patient is a 79 y.o. male presenting with fall. The history is provided by the patient.  Fall This is a new problem. The current episode started 1 to 2 hours ago. Episode frequency: once. The problem has been resolved. Pertinent negatives include no chest pain, no abdominal pain, no headaches and no shortness of breath. Nothing aggravates the symptoms. Nothing relieves the symptoms. He has tried nothing for the symptoms. The treatment provided no relief.    Past Medical History  Diagnosis Date  . COPD (chronic obstructive pulmonary disease)   . Hyperlipidemia   . Myocardial infarction 2003  . Arthritis   . Joint pain   . Bronchitis   . Productive cough   . Anxiety   . Diabetes mellitus without complication   . Stroke   . GERD (gastroesophageal reflux disease)    Past Surgical History  Procedure Laterality Date  . Total hip arthroplasty  2005  . Coronary artery bypass graft  2003  . Femoral artery - femoral artery bypass graft  2003    right to left  . Lumbar disc surgery      x2 1999 & 2000  . Aortagram  01/20/2012    Abdominal Aortagram  . Rhae [place] without cardioversion  07/27/2012    Procedure: TRANSESOPHAGEAL ECHOCARDIOGRAM (TEE);  Surgeon: Lewayne BuntingBrian S Crenshaw, MD;  [facility];  Service: Cardiovascular;  Laterality: N/A;   No family history on file. History  Substance Use Topics  . Smoking status: Former Smoker    Types: Cigarettes    Quit date: 09/15/2001  . Smokeless tobacco: Never Used  . Alcohol Use: No     Comment: quit  12 years ago    Review of Systems  Constitutional: Negative for fever.  HENT: Negative for drooling and rhinorrhea.   Eyes: Negative for pain.  Respiratory: Positive for  cough (prod of gray sputum x 1 week). Negative for shortness of breath.   Cardiovascular: Negative for chest pain and leg swelling.  Gastrointestinal: Negative for nausea, vomiting, abdominal pain and diarrhea.  Genitourinary: Negative for dysuria and hematuria.  Musculoskeletal: Negative for gait problem and neck pain.  Skin: Negative for color change.  Neurological: Negative for numbness and headaches.  Hematological: Negative for adenopathy.  Psychiatric/Behavioral: Negative for behavioral problems.  All other systems reviewed and are negative.      Allergies  Actos; Erythromycin; Escitalopram oxalate; Lipitor; Omnipaque; and Pletaal  Home Medications   Current Outpatient Rx  Name  Route  Sig  Dispense  Refill  . EXPIRED: albuterol (PROVENTIL HFA;VENTOLIN HFA) 108 (90 BASE) MCG/ACT inhaler   Inhalation   Inhale 2 puffs into the lungs every 6 (six) hours as needed. For wheezing         . aspirin EC 81 MG tablet   Oral   Take 81 mg by mouth daily.           . clopidogrel (PLAVIX) 75 MG tablet   Oral   Take 75 mg by mouth daily.         . diphenhydrAMINE (BENADRYL) 25 MG tablet   Oral   Take 25 mg by mouth at bedtime as needed for sleep.         Marland Kitchen. ezetimibe (ZETIA) 10  MG tablet   Oral   Take 10 mg by mouth daily.           . famotidine (PEPCID) 20 MG tablet      One at bedtime   30 tablet   0     Needs appt   . Fexofenadine HCl (ALLEGRA PO)   Oral   Take 1 tablet by mouth daily.          Marland Kitchen HYDROcodone-acetaminophen (NORCO/VICODIN) 5-325 MG per tablet   Oral   Take 1 tablet by mouth every 6 (six) hours as needed for pain.         Marland Kitchen LORazepam (ATIVAN) 0.5 MG tablet   Oral   Take 0.5 mg by mouth 3 (three) times daily as needed for anxiety.         . metFORMIN (GLUCOPHAGE) 500 MG tablet   Oral   Take 500 mg by mouth 2 (two) times daily with a meal.           . methocarbamol (ROBAXIN) 500 MG tablet   Oral   Take by mouth as needed.          . metoprolol tartrate (LOPRESSOR) 25 MG tablet   Oral   Take 12.5 mg by mouth daily.         . Multiple Vitamins-Minerals (EYE VITAMINS PO)   Oral   Take 1 tablet by mouth 2 (two) times daily.          Marland Kitchen omeprazole (PRILOSEC) 20 MG capsule      Take 2  30 min before eat         . rosuvastatin (CRESTOR) 40 MG tablet   Oral   Take 20 mg by mouth daily.          . Tamsulosin HCl (FLOMAX) 0.4 MG CAPS   Oral   Take 0.4 mg by mouth daily.          Marland Kitchen venlafaxine XR (EFFEXOR-XR) 75 MG 24 hr capsule   Oral   Take 150 mg by mouth at bedtime.         . vitamin C (ASCORBIC ACID) 500 MG tablet   Oral   Take 500 mg by mouth daily.           . Vitamin D, Ergocalciferol, (DRISDOL) 50000 UNITS CAPS   Oral   Take 50,000 Units by mouth every 7 (seven) days. On Fridays         . zolpidem (AMBIEN) 10 MG tablet   Oral   Take 10 mg by mouth at bedtime.           BP 93/47  Pulse 99  Temp(Src) 98 F (36.7 C) (Oral)  Resp 20  Ht 5\' 10"  (1.778 m)  Wt 216 lb (97.977 kg)  BMI 30.99 kg/m2  SpO2 95% Physical Exam  Nursing note and vitals reviewed. Constitutional: He is oriented to person, place, and time. He appears well-developed and well-nourished.  HENT:  Right Ear: External ear normal.  Left Ear: External ear normal.  Nose: Nose normal.  Mouth/Throat: Oropharynx is clear and moist. No oropharyngeal exudate.  Abrasion w/ mild swelling to left lateral supraorbital area.   Eyes: Conjunctivae and EOM are normal. Pupils are equal, round, and reactive to light.  Neck: Normal range of motion. Neck supple.  Cardiovascular: Normal rate, regular rhythm, normal heart sounds and intact distal pulses.  Exam reveals no gallop and no friction rub.   No murmur heard. Pulmonary/Chest: Effort normal  and breath sounds normal. No respiratory distress. He has no wheezes.  Abdominal: Soft. Bowel sounds are normal. He exhibits no distension. There is no tenderness. There is no  rebound and no guarding.  Musculoskeletal: Normal range of motion. He exhibits tenderness (mild ttp of left lateral hip and mildly limited rom of left hip. 2+ distal pulses. Sensation intact diffusely. ). He exhibits no edema.  Neurological: He is alert and oriented to person, place, and time. He has normal strength. No sensory deficit. Coordination normal.  Skin: Skin is warm and dry.  Psychiatric: He has a normal mood and affect. His behavior is normal.    ED Course  Procedures (including critical care time) Labs Review Labs Reviewed  CBC WITH DIFFERENTIAL - Abnormal; Notable for the following:    WBC 11.2 (*)    RBC 3.54 (*)    Hemoglobin 11.8 (*)    HCT 34.7 (*)    Neutrophils Relative % 83 (*)    Neutro Abs 9.3 (*)    Lymphocytes Relative 11 (*)    All other components within normal limits  BASIC METABOLIC PANEL - Abnormal; Notable for the following:    Sodium 136 (*)    Glucose, Bld 130 (*)    BUN 34 (*)    Creatinine, Ser 1.96 (*)    GFR calc non Af Amer 31 (*)    GFR calc Af Amer 36 (*)    All other components within normal limits  URINALYSIS, ROUTINE W REFLEX MICROSCOPIC - Abnormal; Notable for the following:    Color, Urine AMBER (*)    APPearance CLOUDY (*)    Specific Gravity, Urine 1.038 (*)    Ketones, ur 15 (*)    Protein, ur 30 (*)    Leukocytes, UA SMALL (*)    All other components within normal limits  URINE MICROSCOPIC-ADD ON - Abnormal; Notable for the following:    Bacteria, UA FEW (*)    Casts HYALINE CASTS (*)    All other components within normal limits  BASIC METABOLIC PANEL - Abnormal; Notable for the following:    Glucose, Bld 135 (*)    BUN 26 (*)    GFR calc non Af Amer 55 (*)    GFR calc Af Amer 64 (*)    All other components within normal limits  CBC - Abnormal; Notable for the following:    WBC 19.7 (*)    RBC 3.57 (*)    Hemoglobin 11.9 (*)    HCT 34.7 (*)    All other components within normal limits  GLUCOSE, CAPILLARY - Abnormal;  Notable for the following:    Glucose-Capillary 144 (*)    All other components within normal limits  GLUCOSE, CAPILLARY - Abnormal; Notable for the following:    Glucose-Capillary 150 (*)    All other components within normal limits  BLOOD GAS, ARTERIAL - Abnormal; Notable for the following:    pCO2 arterial 28.8 (*)    pO2, Arterial 70.0 (*)    Bicarbonate 18.6 (*)    Acid-base deficit 5.0 (*)    All other components within normal limits  CBG MONITORING, ED - Abnormal; Notable for the following:    Glucose-Capillary 100 (*)    All other components within normal limits  URINE CULTURE  TROPONIN I  TROPONIN I  TROPONIN I  URINE RAPID DRUG SCREEN (HOSP PERFORMED)  LACTIC ACID, PLASMA  CK   Imaging Review Dg Chest 2 View  11/28/2013   CLINICAL DATA:  Fall.  Productive cough for 1 week.  EXAM: CHEST  2 VIEW  COMPARISON:  04/05/2013  FINDINGS: Sequelae of prior CABG are again identified. The cardiomediastinal silhouette is within normal limits. There is increased peribronchial thickening. Lungs remain hyperinflated. No pleural effusion or pneumothorax is identified. No segmental airspace consolidation is seen. No acute osseous abnormality is identified.  IMPRESSION: Peribronchial thickening, query bronchitis.   Electronically Signed   By: Sebastian Ache   On: 11/28/2013 17:49   Dg Hip Complete Left  11/28/2013   CLINICAL DATA:  Left hip pain  EXAM: LEFT HIP - COMPLETE 2+ VIEW  COMPARISON:  None.  FINDINGS: The patient is status post left hip arthroplasty. There is no acute fracture or subluxation identified. Mild to moderate degenerative changes involve the right hip. There is no evidence of hip fracture or dislocation. There is no evidence of arthropathy or other focal bone abnormality.  IMPRESSION: 1. No acute findings. 2. Previous left hip arthroplasty.   Electronically Signed   By: Signa Kell M.D.   On: 11/28/2013 17:36   Ct Head Wo Contrast  11/29/2013   CLINICAL DATA:  Change in  mental status, possible stroke  EXAM: CT HEAD WITHOUT CONTRAST  TECHNIQUE: Contiguous axial images were obtained from the base of the skull through the vertex without intravenous contrast.  COMPARISON:  CT HEAD W/O CM dated 11/28/2013; CT C SPINE W/O CM dated 11/28/2013  FINDINGS: Multifocal ethmoid air cell opacification with inflammatory change also seen in the maxillary sinuses, inferior frontal sinuses, and sphenoid sinuses with air-fluid levels in the sphenoid sinuses. No skull fracture. No hemorrhage or extra-axial fluid. No evidence of vascular territory infarct. There is stable moderate diffuse atrophy and moderate to severe low attenuation in the deep white matter.  IMPRESSION: Chronic ischemic change, stable. Chronic sinusitis, mildly worse when compared to the prior study.   Electronically Signed   By: Esperanza Heir M.D.   On: 11/29/2013 14:39   Ct Head Wo Contrast  11/28/2013   CLINICAL DATA:  Larey Seat while walking, dizziness, abrasion to left eyebrow  EXAM: CT HEAD WITHOUT CONTRAST  CT CERVICAL SPINE WITHOUT CONTRAST  TECHNIQUE: Multidetector CT imaging of the head and cervical spine was performed following the standard protocol without intravenous contrast. Multiplanar CT image reconstructions of the cervical spine were also generated.  COMPARISON:  CT head 07/24/2012  FINDINGS: CT HEAD FINDINGS  Motion artifacts, for which repeat imaging was performed.  Generalized atrophy.  Normal ventricular morphology.  No midline shift or mass effect.  Small vessel chronic ischemic changes of deep cerebral white matter.  No intracranial hemorrhage, mass lesion, or acute infarction.  Scattered mucosal thickening in paranasal sinuses.  Calvaria intact.  Atherosclerotic calcifications the carotid siphons.  Bones unremarkable.  CT CERVICAL SPINE FINDINGS  Extensive carotid atherosclerotic calcifications most prominent at bifurcations.  Prevertebral soft tissues normal thickness.  Diffuse osseous demineralization.   Tips of lung apices clear.  Visualized skullbase intact.  Incomplete posterior arch C1, normal variant.  3.6 mm retrolisthesis at C3-C4.  Minimal anterolisthesis at C4-C5.  Scattered degenerative disc disease changes greatest at C3-C4 and C5-C6.  Vertebral body heights maintained without fracture or bone destruction.  Multilevel facet degenerative changes bilaterally.  IMPRESSION: Atrophy with small vessel chronic ischemic changes of deep cerebral white matter.  No acute intracranial abnormalities.  Multilevel degenerative disc and facet disease changes cervical spine as above.  Extensive carotid vascular calcification.  No acute cervical spine abnormalities.   Electronically Signed  By: Ulyses Southward M.D.   On: 11/28/2013 17:18   Ct Cervical Spine Wo Contrast  11/28/2013   CLINICAL DATA:  Larey Seat while walking, dizziness, abrasion to left eyebrow  EXAM: CT HEAD WITHOUT CONTRAST  CT CERVICAL SPINE WITHOUT CONTRAST  TECHNIQUE: Multidetector CT imaging of the head and cervical spine was performed following the standard protocol without intravenous contrast. Multiplanar CT image reconstructions of the cervical spine were also generated.  COMPARISON:  CT head 07/24/2012  FINDINGS: CT HEAD FINDINGS  Motion artifacts, for which repeat imaging was performed.  Generalized atrophy.  Normal ventricular morphology.  No midline shift or mass effect.  Small vessel chronic ischemic changes of deep cerebral white matter.  No intracranial hemorrhage, mass lesion, or acute infarction.  Scattered mucosal thickening in paranasal sinuses.  Calvaria intact.  Atherosclerotic calcifications the carotid siphons.  Bones unremarkable.  CT CERVICAL SPINE FINDINGS  Extensive carotid atherosclerotic calcifications most prominent at bifurcations.  Prevertebral soft tissues normal thickness.  Diffuse osseous demineralization.  Tips of lung apices clear.  Visualized skullbase intact.  Incomplete posterior arch C1, normal variant.  3.6 mm  retrolisthesis at C3-C4.  Minimal anterolisthesis at C4-C5.  Scattered degenerative disc disease changes greatest at C3-C4 and C5-C6.  Vertebral body heights maintained without fracture or bone destruction.  Multilevel facet degenerative changes bilaterally.  IMPRESSION: Atrophy with small vessel chronic ischemic changes of deep cerebral white matter.  No acute intracranial abnormalities.  Multilevel degenerative disc and facet disease changes cervical spine as above.  Extensive carotid vascular calcification.  No acute cervical spine abnormalities.   Electronically Signed   By: Ulyses Southward M.D.   On: 11/28/2013 17:18   Ct Hip Left Wo Contrast  11/28/2013   CLINICAL DATA:  Fall.  Left hip pain appear  EXAM: CT OF THE LEFT HIP WITHOUT CONTRAST  TECHNIQUE: Multidetector CT imaging was performed according to the standard protocol. Multiplanar CT image reconstructions were also generated.  COMPARISON:  DG HIP COMPLETE*L* dated 11/28/2013  FINDINGS: Patient status post total left hip replacement. Good anatomic alignment noted. No evidence of loosening. No acute bony abnormality. No evidence of fracture. Atherosclerotic vascular calcification of the iliofemoral vessels.  IMPRESSION: 1. No acute bony abnormality. No evidence of fracture all dislocation. 2. Patient a prior total left hip replacement with good anatomic alignment.   Electronically Signed   By: Maisie Fus  Register   On: 11/28/2013 19:37     EKG Interpretation   Date/Time:  Monday November 28 2013 16:19:17 EDT Ventricular Rate:  102 PR Interval:  101 QRS Duration: 135 QT Interval:  378 QTC Calculation: 492 R Axis:   34 Text Interpretation:  Sinus tachycardia Right bundle branch block Abnormal  inferior Q waves No significant change since last tracing Confirmed by  Kyuss Hale  MD, Mee Macdonnell ([id number]) on 11/28/2013 4:38:37 PM      MDM   Final diagnoses:  Contusion of left hip  Renal insufficiency    4:35 PM 79 y.o. male with a history of COPD, MI  s/p CABG presents with a self-reported mechanical fall which occurred prior to arrival. The patient states that he was on his way back from a doctor's appointment. He states that he was walking up the ramp to his back door when he slipped falling on his left side and hitting his head. He denies syncope. He denies loss of consciousness. He states that it was purely mechanical. He denies any prodromal symptoms such as dizziness to me. He has mild pain in  his left hip on exam currently. He denies any other complaints. Will get screening imaging and labs. Tylenol for pain. Pt believes tdap UTD w/in last few years.   RI noted on labs. Plain film neg. Pt c/o worsening pain in left hip. Will try percocet for pain and get CT of hip to r/o occult fx.   No fx on CT. Pain slightly improved, but pt not able to ambulate. Pt does have home health during the day but lives at home w/ his wife who has dementia and I'm worried for his/her wellbeing at night. Will admit d/t RI and hip contusion w/ change in functional status.     Junius Argyle, MD 11/29/13 1501

## 2013-11-28 NOTE — ED Notes (Addendum)
Per EMS, Patient was out walking with his wife when he lost his balance and fell on his left side. Patient reports dizziness before the fall. Patient denies hitting his head, but states "I don't think so." EMS reported no shortening or internal rotation of the left leg. No Loss of consciousness. Vitals per EMS: 110/50, 98 % on 2L, 104 HR, CBG 189. Patient has history of chest cold this week and open heart surgery.

## 2013-11-29 ENCOUNTER — Encounter (HOSPITAL_COMMUNITY): Payer: Self-pay

## 2013-11-29 ENCOUNTER — Inpatient Hospital Stay (HOSPITAL_COMMUNITY): Payer: Medicare Other

## 2013-11-29 DIAGNOSIS — D72829 Elevated white blood cell count, unspecified: Secondary | ICD-10-CM | POA: Diagnosis present

## 2013-11-29 DIAGNOSIS — J449 Chronic obstructive pulmonary disease, unspecified: Secondary | ICD-10-CM

## 2013-11-29 DIAGNOSIS — S7000XA Contusion of unspecified hip, initial encounter: Principal | ICD-10-CM

## 2013-11-29 DIAGNOSIS — E119 Type 2 diabetes mellitus without complications: Secondary | ICD-10-CM

## 2013-11-29 DIAGNOSIS — A419 Sepsis, unspecified organism: Secondary | ICD-10-CM | POA: Diagnosis present

## 2013-11-29 DIAGNOSIS — J9601 Acute respiratory failure with hypoxia: Secondary | ICD-10-CM | POA: Diagnosis present

## 2013-11-29 DIAGNOSIS — J9600 Acute respiratory failure, unspecified whether with hypoxia or hypercapnia: Secondary | ICD-10-CM

## 2013-11-29 DIAGNOSIS — R6520 Severe sepsis without septic shock: Secondary | ICD-10-CM

## 2013-11-29 DIAGNOSIS — G9340 Encephalopathy, unspecified: Secondary | ICD-10-CM

## 2013-11-29 DIAGNOSIS — N179 Acute kidney failure, unspecified: Secondary | ICD-10-CM

## 2013-11-29 LAB — BLOOD GAS, ARTERIAL
Acid-base deficit: 5 mmol/L — ABNORMAL HIGH (ref 0.0–2.0)
Acid-base deficit: 4.6 mmol/L — ABNORMAL HIGH (ref 0.0–2.0)
BICARBONATE: 18.6 meq/L — AB (ref 20.0–24.0)
BICARBONATE: 22.8 meq/L (ref 20.0–24.0)
Drawn by: 10552
Drawn by: 31288
FIO2: 1 %
LHR: 16 {breaths}/min
O2 Content: 4 L/min
O2 SAT: 96.5 %
O2 Saturation: 93.9 %
PATIENT TEMPERATURE: 98.6
PATIENT TEMPERATURE: 98.3
PEEP/CPAP: 12 cmH2O
PH ART: 7.176 — AB (ref 7.350–7.450)
PO2 ART: 70 mmHg — AB (ref 80.0–100.0)
PRESSURE CONTROL: 15 cmH2O
TCO2: 19.5 mmol/L (ref 0–100)
TCO2: 24.7 mmol/L (ref 0–100)
pCO2 arterial: 28.8 mmHg — ABNORMAL LOW (ref 35.0–45.0)
pCO2 arterial: 63.9 mmHg (ref 35.0–45.0)
pH, Arterial: 7.427 (ref 7.350–7.450)
pO2, Arterial: 112 mmHg — ABNORMAL HIGH (ref 80.0–100.0)

## 2013-11-29 LAB — TROPONIN I
TROPONIN I: 0.39 ng/mL — AB (ref <0.30)
Troponin I: <0.3 ng/mL (ref <0.30)

## 2013-11-29 LAB — POCT I-STAT 3, ART BLOOD GAS (G3+)
Acid-base deficit: 7 mmol/L — ABNORMAL HIGH (ref 0.0–2.0)
Bicarbonate: 17.6 mEq/L — ABNORMAL LOW (ref 20.0–24.0)
O2 Saturation: 89 %
PCO2 ART: 31.1 mmHg — AB (ref 35.0–45.0)
PH ART: 7.36 (ref 7.350–7.450)
Patient temperature: 98.6
TCO2: 19 mmol/L (ref 0–100)
pO2, Arterial: 59 mmHg — ABNORMAL LOW (ref 80.0–100.0)

## 2013-11-29 LAB — CBC
HCT: 34.7 % — ABNORMAL LOW (ref 39.0–52.0)
Hemoglobin: 11.9 g/dL — ABNORMAL LOW (ref 13.0–17.0)
MCH: 33.3 pg (ref 26.0–34.0)
MCHC: 34.3 g/dL (ref 30.0–36.0)
MCV: 97.2 fL (ref 78.0–100.0)
Platelets: 206 10*3/uL (ref 150–400)
RBC: 3.57 MIL/uL — ABNORMAL LOW (ref 4.22–5.81)
RDW: 14.2 % (ref 11.5–15.5)
WBC: 19.7 10*3/uL — AB (ref 4.0–10.5)

## 2013-11-29 LAB — URINE MICROSCOPIC-ADD ON

## 2013-11-29 LAB — GLUCOSE, CAPILLARY
GLUCOSE-CAPILLARY: 150 mg/dL — AB (ref 70–99)
GLUCOSE-CAPILLARY: 145 mg/dL — AB (ref 70–99)
GLUCOSE-CAPILLARY: 171 mg/dL — AB (ref 70–99)
Glucose-Capillary: 144 mg/dL — ABNORMAL HIGH (ref 70–99)

## 2013-11-29 LAB — D-DIMER, QUANTITATIVE (NOT AT ARMC): D DIMER QUANT: 3.69 ug{FEU}/mL — AB (ref 0.00–0.48)

## 2013-11-29 LAB — BASIC METABOLIC PANEL
BUN: 26 mg/dL — ABNORMAL HIGH (ref 6–23)
CHLORIDE: 100 meq/L (ref 96–112)
CO2: 19 meq/L (ref 19–32)
CREATININE: 1.23 mg/dL (ref 0.50–1.35)
Calcium: 8.5 mg/dL (ref 8.4–10.5)
GFR calc Af Amer: 64 mL/min — ABNORMAL LOW (ref >90)
GFR calc non Af Amer: 55 mL/min — ABNORMAL LOW (ref >90)
GLUCOSE: 135 mg/dL — AB (ref 70–99)
Potassium: 4.1 mEq/L (ref 3.7–5.3)
Sodium: 139 mEq/L (ref 137–147)

## 2013-11-29 LAB — APTT: aPTT: 56 seconds — ABNORMAL HIGH (ref 24–37)

## 2013-11-29 LAB — URINALYSIS, ROUTINE W REFLEX MICROSCOPIC
Bilirubin Urine: NEGATIVE
Glucose, UA: 100 mg/dL — AB
Ketones, ur: 15 mg/dL — AB
Leukocytes, UA: NEGATIVE
Nitrite: NEGATIVE
Protein, ur: >300 mg/dL — AB
SPECIFIC GRAVITY, URINE: 1.028 (ref 1.005–1.030)
UROBILINOGEN UA: 1 mg/dL (ref 0.0–1.0)
pH: 5 (ref 5.0–8.0)

## 2013-11-29 LAB — PRO B NATRIURETIC PEPTIDE: Pro B Natriuretic peptide (BNP): 2322 pg/mL — ABNORMAL HIGH (ref 0–450)

## 2013-11-29 LAB — PROCALCITONIN: Procalcitonin: 1.69 ng/mL

## 2013-11-29 LAB — PROTIME-INR
INR: 1.3 (ref 0.00–1.49)
Prothrombin Time: 15.9 seconds — ABNORMAL HIGH (ref 11.6–15.2)

## 2013-11-29 LAB — MRSA PCR SCREENING: MRSA BY PCR: NEGATIVE

## 2013-11-29 LAB — LACTIC ACID, PLASMA: LACTIC ACID, VENOUS: 3.2 mmol/L — AB (ref 0.5–2.2)

## 2013-11-29 LAB — CK: Total CK: 247 U/L — ABNORMAL HIGH (ref 7–232)

## 2013-11-29 MED ORDER — SODIUM CHLORIDE 0.9 % IV SOLN: INTRAVENOUS | Status: DC

## 2013-11-29 MED ORDER — METHYLPREDNISOLONE SODIUM SUCC 125 MG IJ SOLR
60.0000 mg | Freq: Four times a day (QID) | INTRAMUSCULAR | Status: DC
  Administered 2013-11-29: 60 mg via INTRAVENOUS
  Filled 2013-11-29 (×3): qty 0.96

## 2013-11-29 MED ORDER — MORPHINE SULFATE 2 MG/ML IJ SOLN
INTRAMUSCULAR | Status: AC
  Administered 2013-11-29: 2 mg via INTRAVENOUS
  Filled 2013-11-29: qty 1

## 2013-11-29 MED ORDER — LEVOFLOXACIN 500 MG PO TABS
500.0000 mg | ORAL_TABLET | ORAL | Status: DC
  Filled 2013-11-29: qty 1

## 2013-11-29 MED ORDER — FENTANYL CITRATE 0.05 MG/ML IJ SOLN
25.0000 ug | INTRAMUSCULAR | Status: DC | PRN
  Administered 2013-11-29: 100 ug via INTRAVENOUS
  Administered 2013-12-01: 50 ug via INTRAVENOUS
  Administered 2013-12-02 – 2013-12-03 (×3): 100 ug via INTRAVENOUS
  Administered 2013-12-06: 50 ug via INTRAVENOUS

## 2013-11-29 MED ORDER — SODIUM CHLORIDE 0.9 % IV SOLN
0.0000 mg/h | INTRAVENOUS | Status: DC
  Administered 2013-11-29: 2 mg/h via INTRAVENOUS
  Administered 2013-11-30: 3 mg/h via INTRAVENOUS
  Administered 2013-11-30: 4 mg/h via INTRAVENOUS
  Administered 2013-12-01 – 2013-12-02 (×3): 5 mg/h via INTRAVENOUS
  Filled 2013-11-29 (×7): qty 10

## 2013-11-29 MED ORDER — HALOPERIDOL LACTATE 5 MG/ML IJ SOLN
4.0000 mg | Freq: Once | INTRAMUSCULAR | Status: AC
  Administered 2013-11-29: 4 mg via INTRAVENOUS

## 2013-11-29 MED ORDER — MIDAZOLAM HCL 2 MG/2ML IJ SOLN
INTRAMUSCULAR | Status: AC
  Administered 2013-11-29: 2 mg
  Filled 2013-11-29: qty 2

## 2013-11-29 MED ORDER — INSULIN ASPART 100 UNIT/ML ~~LOC~~ SOLN
0.0000 [IU] | SUBCUTANEOUS | Status: DC
  Administered 2013-11-30 (×2): 3 [IU] via SUBCUTANEOUS
  Administered 2013-11-30: 2 [IU] via SUBCUTANEOUS
  Administered 2013-11-30: 3 [IU] via SUBCUTANEOUS
  Administered 2013-11-30 (×2): 5 [IU] via SUBCUTANEOUS
  Administered 2013-11-30: 20:00:00 via SUBCUTANEOUS
  Administered 2013-12-01: 2 [IU] via SUBCUTANEOUS
  Administered 2013-12-01 (×2): 3 [IU] via SUBCUTANEOUS
  Administered 2013-12-01: 5 [IU] via SUBCUTANEOUS
  Administered 2013-12-01 – 2013-12-02 (×5): 3 [IU] via SUBCUTANEOUS
  Administered 2013-12-02: 5 [IU] via SUBCUTANEOUS
  Administered 2013-12-02: 2 [IU] via SUBCUTANEOUS
  Administered 2013-12-03 (×2): 3 [IU] via SUBCUTANEOUS
  Administered 2013-12-03: 5 [IU] via SUBCUTANEOUS
  Administered 2013-12-03 (×2): 3 [IU] via SUBCUTANEOUS
  Administered 2013-12-03: 5 [IU] via SUBCUTANEOUS
  Administered 2013-12-04: 8 [IU] via SUBCUTANEOUS
  Administered 2013-12-04: 3 [IU] via SUBCUTANEOUS
  Administered 2013-12-04 (×4): 8 [IU] via SUBCUTANEOUS
  Administered 2013-12-04 – 2013-12-05 (×2): 5 [IU] via SUBCUTANEOUS
  Administered 2013-12-05: 8 [IU] via SUBCUTANEOUS
  Administered 2013-12-05: 5 [IU] via SUBCUTANEOUS
  Administered 2013-12-05: 8 [IU] via SUBCUTANEOUS
  Administered 2013-12-05: 5 [IU] via SUBCUTANEOUS
  Administered 2013-12-06: 8 [IU] via SUBCUTANEOUS
  Administered 2013-12-06 (×2): 5 [IU] via SUBCUTANEOUS

## 2013-11-29 MED ORDER — NOREPINEPHRINE BITARTRATE 1 MG/ML IJ SOLN
2.0000 ug/min | INTRAMUSCULAR | Status: DC
Start: 2013-11-29 — End: 2013-12-04
  Administered 2013-11-29: 2 ug/min via INTRAVENOUS
  Filled 2013-11-29: qty 4

## 2013-11-29 MED ORDER — ETOMIDATE 2 MG/ML IV SOLN
INTRAVENOUS | Status: AC
  Filled 2013-11-29: qty 10

## 2013-11-29 MED ORDER — DEXMEDETOMIDINE HCL IN NACL 200 MCG/50ML IV SOLN
0.4000 ug/kg/h | INTRAVENOUS | Status: DC
Start: 2013-11-29 — End: 2013-11-29
  Administered 2013-11-29: 0.8 ug/kg/h via INTRAVENOUS
  Filled 2013-11-29 (×2): qty 50

## 2013-11-29 MED ORDER — PHENOL 1.4 % MT LIQD: 1.0000 | OROMUCOSAL | Status: DC | PRN

## 2013-11-29 MED ORDER — HALOPERIDOL LACTATE 5 MG/ML IJ SOLN
1.0000 mg | INTRAMUSCULAR | Status: DC | PRN
  Filled 2013-11-29: qty 1

## 2013-11-29 MED ORDER — SODIUM CHLORIDE 0.9 % IV BOLUS (SEPSIS)
500.0000 mL | Freq: Once | INTRAVENOUS | Status: AC
  Administered 2013-11-29: 500 mL via INTRAVENOUS

## 2013-11-29 MED ORDER — HALOPERIDOL LACTATE 5 MG/ML IJ SOLN
INTRAMUSCULAR | Status: AC
  Administered 2013-11-29: 4 mg via INTRAVENOUS
  Filled 2013-11-29: qty 1

## 2013-11-29 MED ORDER — BIOTENE DRY MOUTH MT LIQD
15.0000 mL | Freq: Four times a day (QID) | OROMUCOSAL | Status: DC
  Administered 2013-11-30 – 2013-12-10 (×44): 15 mL via OROMUCOSAL

## 2013-11-29 MED ORDER — ALBUTEROL SULFATE (2.5 MG/3ML) 0.083% IN NEBU
2.5000 mg | INHALATION_SOLUTION | RESPIRATORY_TRACT | Status: DC
  Administered 2013-11-29 – 2013-12-06 (×39): 2.5 mg via RESPIRATORY_TRACT
  Filled 2013-11-29 (×38): qty 3

## 2013-11-29 MED ORDER — VANCOMYCIN HCL IN DEXTROSE 750-5 MG/150ML-% IV SOLN
750.0000 mg | Freq: Two times a day (BID) | INTRAVENOUS | Status: DC
  Administered 2013-11-30 – 2013-12-01 (×4): 750 mg via INTRAVENOUS
  Filled 2013-11-29 (×6): qty 150

## 2013-11-29 MED ORDER — METOPROLOL TARTRATE 1 MG/ML IV SOLN
2.5000 mg | Freq: Four times a day (QID) | INTRAVENOUS | Status: DC
  Administered 2013-11-29 – 2013-11-30 (×2): 2.5 mg via INTRAVENOUS
  Filled 2013-11-29 (×6): qty 5

## 2013-11-29 MED ORDER — PANTOPRAZOLE SODIUM 40 MG IV SOLR
40.0000 mg | INTRAVENOUS | Status: DC
  Administered 2013-11-29 – 2013-12-02 (×4): 40 mg via INTRAVENOUS
  Filled 2013-11-29 (×7): qty 40

## 2013-11-29 MED ORDER — SODIUM CHLORIDE 0.9 % IV SOLN
INTRAVENOUS | Status: DC
  Administered 2013-11-29 – 2013-12-10 (×3): via INTRAVENOUS

## 2013-11-29 MED ORDER — FENTANYL CITRATE 0.05 MG/ML IJ SOLN
INTRAMUSCULAR | Status: AC
  Filled 2013-11-29: qty 2

## 2013-11-29 MED ORDER — CISATRACURIUM BESYLATE 10 MG/ML IV SOLN
3.0000 ug/kg/min | INTRAVENOUS | Status: DC
Start: 2013-11-29 — End: 2013-12-01
  Administered 2013-11-29: 3 ug/kg/min via INTRAVENOUS
  Administered 2013-11-30: 6 ug/kg/min via INTRAVENOUS
  Administered 2013-12-01: 3 ug/kg/min via INTRAVENOUS
  Filled 2013-11-29 (×4): qty 20

## 2013-11-29 MED ORDER — ALBUTEROL SULFATE (2.5 MG/3ML) 0.083% IN NEBU
2.5000 mg | INHALATION_SOLUTION | Freq: Four times a day (QID) | RESPIRATORY_TRACT | Status: DC
  Administered 2013-11-29: 2.5 mg via RESPIRATORY_TRACT
  Filled 2013-11-29: qty 3

## 2013-11-29 MED ORDER — MORPHINE SULFATE 2 MG/ML IJ SOLN
2.0000 mg | Freq: Once | INTRAMUSCULAR | Status: AC
  Administered 2013-11-29: 2 mg via INTRAVENOUS

## 2013-11-29 MED ORDER — DEXTROSE 5 % IV SOLN
1.0000 g | INTRAVENOUS | Status: DC
  Administered 2013-11-29: 1 g via INTRAVENOUS
  Filled 2013-11-29: qty 10

## 2013-11-29 MED ORDER — LORAZEPAM 1 MG PO TABS
1.0000 mg | ORAL_TABLET | Freq: Four times a day (QID) | ORAL | Status: DC | PRN
  Administered 2013-11-29: 1 mg via ORAL
  Filled 2013-11-29: qty 1

## 2013-11-29 MED ORDER — SODIUM CHLORIDE 0.9 % IV SOLN
25.0000 ug/h | INTRAVENOUS | Status: DC
  Administered 2013-11-29: 100 ug/h via INTRAVENOUS
  Administered 2013-11-30 (×2): 200 ug/h via INTRAVENOUS
  Administered 2013-12-01 (×2): 250 ug/h via INTRAVENOUS
  Administered 2013-12-02: 75 ug/h via INTRAVENOUS
  Administered 2013-12-03 (×2): 300 ug/h via INTRAVENOUS
  Administered 2013-12-03 – 2013-12-04 (×2): 350 ug/h via INTRAVENOUS
  Administered 2013-12-04: 150 ug/h via INTRAVENOUS
  Administered 2013-12-05: 25 ug/h via INTRAVENOUS
  Administered 2013-12-06: 200 ug/h via INTRAVENOUS
  Administered 2013-12-07: 100 ug/h via INTRAVENOUS
  Filled 2013-11-29 (×13): qty 50

## 2013-11-29 MED ORDER — CISATRACURIUM BOLUS VIA INFUSION
2.5000 mg | Freq: Once | INTRAVENOUS | Status: AC
  Administered 2013-11-29: 2.5 mg via INTRAVENOUS
  Filled 2013-11-29 (×2): qty 3

## 2013-11-29 MED ORDER — CHLORHEXIDINE GLUCONATE 0.12 % MT SOLN
15.0000 mL | Freq: Two times a day (BID) | OROMUCOSAL | Status: DC
  Administered 2013-11-30 – 2013-12-10 (×22): 15 mL via OROMUCOSAL
  Filled 2013-11-29 (×21): qty 15

## 2013-11-29 MED ORDER — HEPARIN BOLUS VIA INFUSION
4000.0000 [IU] | Freq: Once | INTRAVENOUS | Status: DC
  Filled 2013-11-29: qty 4000

## 2013-11-29 MED ORDER — PIPERACILLIN-TAZOBACTAM 3.375 G IVPB
3.3750 g | Freq: Three times a day (TID) | INTRAVENOUS | Status: DC
  Administered 2013-11-30 – 2013-12-04 (×14): 3.375 g via INTRAVENOUS
  Filled 2013-11-29 (×17): qty 50

## 2013-11-29 MED ORDER — LEVOFLOXACIN 500 MG PO TABS: 500.0000 mg | ORAL_TABLET | Freq: Every day | ORAL | Status: DC

## 2013-11-29 MED ORDER — ARTIFICIAL TEARS OP OINT
1.0000 "application " | TOPICAL_OINTMENT | Freq: Three times a day (TID) | OPHTHALMIC | Status: DC
  Administered 2013-11-30 – 2013-12-01 (×6): 1 via OPHTHALMIC
  Filled 2013-11-29 (×2): qty 3.5

## 2013-11-29 MED ORDER — HEPARIN (PORCINE) IN NACL 100-0.45 UNIT/ML-% IJ SOLN
1400.0000 [IU]/h | INTRAMUSCULAR | Status: DC
  Administered 2013-11-29 – 2013-11-30 (×2): 1600 [IU]/h via INTRAVENOUS
  Administered 2013-11-30 – 2013-12-01 (×2): 1400 [IU]/h via INTRAVENOUS
  Filled 2013-11-29 (×6): qty 250

## 2013-11-29 NOTE — Evaluation (Signed)
Physical Therapy Evaluation Patient Details Name: Ronald Frank MRN: [id number] DOB: 12/14/1934 Today's Date: 11/29/2013 Time: [id number] PT Time Calculation (min): 18 min  PT Assessment / Plan / Recommendation History of Present Illness  Left hip contusion status post mechanical fall. Delirium  Clinical Impression  Pt admitted with hip contusion on Left after falling at home and a recent history of confusion. Pt currently with functional limitations due to the deficits listed below (see PT Problem List). Difficult to obtain history from patient at this time due to anxiety and restlessness. Pt unable to point to where his pain is, and was NOT tender to palpation over left greater trochanter and left knee, however screams whenever moving his left leg at the hip. Pt will benefit from skilled PT to increase their independence and safety with mobility to allow discharge to the venue listed below. This recommendation may change with an improvement in his cognitive status or consult with family.     PT Assessment  Patient needs continued PT services    Follow Up Recommendations  SNF;Supervision/Assistance - 24 hour    Does the patient have the potential to tolerate intense rehabilitation      Barriers to Discharge Other (comment) (Unavailable history for support and home environment) Will need information from family concerning baseline, and support at home    Equipment Recommendations  None recommended by PT    Recommendations for Other Services OT consult   Frequency Min 3X/week    Precautions / Restrictions Precautions Precautions: Fall Restrictions Weight Bearing Restrictions: No   Pertinent Vitals/Pain Pt reports "NEED PAIN MEDICINE!"  Unable to rate pain Nurse aware and in room Pt repositioned in chair for comfort with chair alarm in place       Mobility  Bed Mobility Overal bed mobility: +2 for physical assistance;Needs Assistance Bed Mobility: Supine to Sit Supine to  sit: Mod assist General bed mobility comments: Pt impulsive quickly to lie down and complain of pain, needs assist with supine>sit +2 for safety for poor trunk control and to scoot R hip forward Transfers Overall transfer level: Needs assistance Equipment used: Rolling walker (2 wheeled) Transfers: Sit to/from BJ'sStand;Stand Pivot Transfers Sit to Stand: +2 safety/equipment;Mod assist Stand pivot transfers: Min assist;+2 safety/equipment General transfer comment: +2 for safety, Mod assist with sit>stand to steady RW and physical assist to standing position, favors R leg, able to bear weight and pivot on both LEs once standing, no complaint of pain, Min Assist.    Exercises     PT Diagnosis: Difficulty walking;Abnormality of gait;Generalized weakness;Acute pain;Altered mental status  PT Problem List: Decreased strength;Decreased range of motion;Decreased activity tolerance;Decreased balance;Decreased mobility;Decreased cognition;Decreased knowledge of use of DME;Decreased safety awareness;Pain PT Treatment Interventions: DME instruction;Gait training;Stair training;Functional mobility training;Therapeutic activities;Therapeutic exercise;Balance training;Neuromuscular re-education;Cognitive remediation;Patient/family education;Modalities     PT Goals(Current goals can be found in the care plan section) Acute Rehab PT Goals Patient Stated Goal: Unable to state goal at this time PT Goal Formulation: Patient unable to participate in goal setting Time For Goal Achievement: 12/06/13 Potential to Achieve Goals: Fair (Depending on congnitive status)  Visit Information  Last PT Received On: 11/29/13 Assistance Needed: +2 History of Present Illness: Left hip contusion status post mechanical fall. Delirium       Prior Functioning  Home Living Family/patient expects to be discharged to:: Unsure Additional Comments: Pt unable to give history, no family members present - Pt states he lives at home  with his wife Prior Function Level of Independence:  (  Unkown, pt unable to give history) Comments: Pt unable to answer questions due to pain Communication Communication: Other (comment) (Pt frequently yelling due to pain)    Cognition  Cognition Arousal/Alertness: Awake/alert Behavior During Therapy: Restless;Agitated;Impulsive;Anxious Overall Cognitive Status: No family/caregiver present to determine baseline cognitive functioning    Extremity/Trunk Assessment Upper Extremity Assessment Upper Extremity Assessment: Difficult to assess due to impaired cognition Lower Extremity Assessment Lower Extremity Assessment: Difficult to assess due to impaired cognition   Balance Balance Overall balance assessment: Needs assistance Sitting-balance support: Bilateral upper extremity supported Sitting balance-Leahy Scale: Poor Sitting balance - Comments: leans posteriorly and to R Standing balance support: Bilateral upper extremity supported Standing balance-Leahy Scale: Poor Standing balance comment: Neesd RW for UE support  End of Session PT - End of Session Equipment Utilized During Treatment: Gait belt Activity Tolerance: Treatment limited secondary to agitation;Patient limited by pain Patient left: in chair;with call bell/phone within reach;with chair alarm set;with nursing/sitter in room Nurse Communication: Mobility status;Patient requests pain meds  GP    [facility] [place], [place] [phone]  Berton Mount 11/29/2013, 1:37 PM

## 2013-11-29 NOTE — Progress Notes (Signed)
Pt experiencing pain on a level of 10 of 10 and extremely agitated. Administered fentanyl and klonopin. Bathed patient, changed sheets and situated patient. Checked on patient 35 minutes later. Pt pulled out IV and oxygen and was no longer oriented. Reapplied O2. Post O2 application, patient began to reorient. Patient now oriented X4, but still agitated. Will continue to monitor.

## 2013-11-29 NOTE — Progress Notes (Signed)
eLink Physician-Brief Progress Note Patient Name: Ronald HeapsHenry T Frank DOB: 12/15/1934 MRN: [id number]  Date of Service  11/29/2013   HPI/Events of Note   resp acidosis on NMB  eICU Interventions  Increase RR ARDS protocol for PEEP/FIO2 Rpt ABG in 1h   Intervention Category Intermediate Interventions: Diagnostic test evaluation  ALVA,RAKESH V. 11/29/2013, 10:33 PM

## 2013-11-29 NOTE — Significant Event (Addendum)
Rapid Response Event Note  Overview: Time Called: 1325 Arrival Time: 1325 Event Type: Respiratory  Initial Focused Assessment: Patient in resp. Distress.  RR 40-48.  Increased WOB Lung sounds coarse, regular heart tones Extreme pain in left hip with muscle cramping with movement Patient confused, then asking about "skeletons" in the air. BP 144/72 ST 127  RR 40-48  O2 sat 92% on 4L Dr Benjamine MolaVann at bedside  Interventions: ABG done 7.01/10/73/18 PCXR done [facility]IV place [facility] CT done  O2 sats 88% on 4L, increased O2 to 50% Venti  O2 sats 93-96% Son in  [place]  Patient mildly improved.  MD requested SDU,  Placed on Tele and pulse Ox Will cont to monitor, RN to call if assistance needed.   Event Summary: Name of Physician Notified: Dr Benjamine MolaVann at 1325    at    Outcome: Transferred (Comment)  Event End Time: 1510  Ronald Frank, Kayleigh Broadwell

## 2013-11-29 NOTE — Procedures (Signed)
Intubation Procedure Note Ronald HeapsHenry T Siska [id number] 01/11/1935  Procedure: Intubation Indications: Respiratory insufficiency  Procedure Details Consent: Unable to obtain consent because of altered level of consciousness. Time Out: Verified patient identification, verified procedure, site/side was marked, verified correct patient position, special equipment/implants available, medications/allergies/relevent history reviewed, required imaging and test results available.  Performed  Glidescope Cords fully visulaized ETT passed on first attempt  Evaluation Hemodynamic Status: BP stable throughout; O2 sats: stable throughout and transiently fell during during procedure Patient's Current Condition: stable Complications: No apparent complications Patient did tolerate procedure well. Chest X-ray ordered to verify placement.  CXR: tube position acceptable.   Rutherford Guysahul Desai, PA - [facility] Pgr: [phone]  or [phone]   I was present for and supervised the entire procedure  Billy Fischeravid Marcy Bogosian, MD ; [facility] Mobile [phone].  After 5:30 PM or weekends, call [phone]

## 2013-11-29 NOTE — Progress Notes (Signed)
Patient without improvement in resp status and worsening mental status.  Patient more confuse and combative refusing to wear O2 and agitated.  Dr Bard HerbertSimmonds at bedside to assess patient.  4mg  Haldol and 2 mg Morphine given without any change.  Attempted 2nd IV start, unsuccessful.  Transferred to ICU.

## 2013-11-29 NOTE — Progress Notes (Signed)
Pt had a very brief episode of a bloody nose. Pt states that he experiences this every few weeks. Pt takes plavix. Pt alert and oriented with stable vital signs. Pt laid back to 20 degrees and cleaned nose. Nose has not bled since. Nasal cannula cleaned in still in place. Will continue to monitor.

## 2013-11-29 NOTE — Progress Notes (Signed)
ANTIBIOTIC CONSULT NOTE - INITIAL  Pharmacy Consult for Vancomycin, Zosyn Indication: rule out sepsis, pneumonia  Allergies  Allergen Reactions  . Actos [Pioglitazone Hydrochloride] Other (See Comments)    unkknown  . Erythromycin Hives and Itching  . Escitalopram Oxalate Hives and Itching  . Lipitor [Atorvastatin Calcium] Other (See Comments)    Weakness   . Omnipaque [Iohexol] Other (See Comments)    unknown  . Pletaal [Cilostazol] Other (See Comments)    unknown    Patient Measurements: Height: 5\' 10"  (177.8 cm) Weight: 216 lb (97.977 kg) IBW/kg (Calculated) : 73  Vital Signs: Temp: 97.7 F (36.5 C) (03/17 1310) BP: 144/72 mmHg (03/17 1404) Pulse Rate: 127 (03/17 1404)  Labs:  Recent Labs  11/28/13 1635 11/29/13 0755  WBC 11.2* 19.7*  HGB 11.8* 11.9*  PLT 209 206  CREATININE 1.96* 1.23   Estimated Creatinine Clearance: 59 ml/min (by C-G formula based on Cr of 1.23). No results found for this basename: VANCOTROUGH, VANCOPEAK, VANCORANDOM, GENTTROUGH, GENTPEAK, GENTRANDOM, TOBRATROUGH, TOBRAPEAK, TOBRARND, AMIKACINPEAK, AMIKACINTROU, AMIKACIN,  in the last 72 hours   Microbiology: No results found for this or any previous visit (from the past 720 hour(s)).  Medical History: Past Medical History  Diagnosis Date  . COPD (chronic obstructive pulmonary disease)   . Hyperlipidemia   . Myocardial infarction 2003  . Arthritis   . Joint pain   . Bronchitis   . Productive cough   . Anxiety   . Diabetes mellitus without complication   . Stroke   . GERD (gastroesophageal reflux disease)     Assessment: 79 year old male with a history of DM, COPD, HTN, HLD, previous stroke who fell at home presented to the ED with a left hip contusion now beginning Vancomycin and Zosyn for rule out sepsis (delirious, elevated HR / RR).  Goal of Therapy:  Vancomycin trough level 15-20 mcg/ml Appropriate Zosyn dosing  Plan:  1) Zosyn 3.375 grams iv Q 8 hours - 4 hr  infusion 2) Vancomycin 750 mg iv Q 12 hours 3) Follow up Scr, cultures, progress  Thank you. Okey RegalLisa Kera Deacon, PharmD [phone]   11/29/2013,4:04 PM

## 2013-11-29 NOTE — Progress Notes (Signed)
Echocardiogram 2D Echocardiogram has been performed.  Ronald Frank, Ronald Frank M 11/29/2013, 8:52 PM

## 2013-11-29 NOTE — Progress Notes (Addendum)
Came back to room, to assess patient. Patient pulled out IV- no longer oriented to person/place- rocephin ordered for suspected UTI HR and RR have elevated EKG ordered Head CT ordered Cardiac enzymes ABG Lactic acid- to r/o sepsis  CK Chest x ray  Spoke with son who denies alcohol use in last 30 years.  Son is worried about patient being overmedicated at home (prescritption) meds.  Spoke with Dr. Vassie LollAlva who will see the patient in consult  Marlin CanaryJessica Preeya Cleckley DO

## 2013-11-29 NOTE — Progress Notes (Signed)
PROGRESS NOTE  Ronald Frank ZOX:[id number] DOB: 30-Dec-1934 DOA: 11/28/2013 PCP: Junious Silk, MD  Assessment/Plan: Left hip contusion status post mechanical fall -  -Physical therapy consult.  -pain control- patient at risk for delerium, so will try to minimize  UTI-? Rocephin- patient may have bronchitis Delirium-may need sitter; bed alarm set  Acute renal failure - probably from dehydration. Patient has been placed on gentle hydration.   Diabetes mellitus type 2 - patient has been placed on sliding-scale coverage. Hold metformin due to renal failure.   CAD status post CABG - denies any chest pain.   History of CVA.   COPD - presently not wheezing.   Anemia - follow CBC   Code Status: full Family Communication: patient Disposition Plan:    Consultants:    Procedures:      HPI/Subjective: Per son- Saturday acute confusion but did not take him anywhere -patient oriented to self/place/time No pain  Objective: Filed Vitals:   11/28/13 2300  BP: 127/50  Pulse: 90  Temp: 98 F (36.7 C)  Resp: 18   No intake or output data in the 24 hours ending 11/29/13 1044 Filed Weights   11/28/13 1621  Weight: 97.977 kg (216 lb)    Exam:  General: Well-developed and nourished.  Cardiovascular: S1-S2 heard.  Respiratory: No rhonchi or crepitations.  Abdomen: Soft nontender bowel sounds present.  Skin: Small swelling of the left periorbital area.  Musculoskeletal: Increased pain on moving his left hip.  Neurologic: Alert awake oriented to time place and person. Moves all extremities   Data Reviewed: Basic Metabolic Panel:  Recent Labs Lab 11/28/13 1635 11/29/13 0755  NA 136* 139  K 4.2 4.1  CL 96 100  CO2 22 19  GLUCOSE 130* 135*  BUN 34* 26*  CREATININE 1.96* 1.23  CALCIUM 8.7 8.5   Liver Function Tests: No results found for this basename: AST, ALT, ALKPHOS, BILITOT, PROT, ALBUMIN,  in the last 168 hours No results found for this basename:  LIPASE, AMYLASE,  in the last 168 hours No results found for this basename: AMMONIA,  in the last 168 hours CBC:  Recent Labs Lab 11/28/13 1635 11/29/13 0755  WBC 11.2* 19.7*  NEUTROABS 9.3*  --   HGB 11.8* 11.9*  HCT 34.7* 34.7*  MCV 98.0 97.2  PLT 209 206   Cardiac Enzymes: No results found for this basename: CKTOTAL, CKMB, CKMBINDEX, TROPONINI,  in the last 168 hours BNP (last 3 results)  Recent Labs  04/05/13 1355  PROBNP 27.0   CBG:  Recent Labs Lab 11/28/13 1831 11/29/13 0725  GLUCAP 100* 144*    No results found for this or any previous visit (from the past 240 hour(s)).   Studies: Dg Chest 2 View  11/28/2013   CLINICAL DATA:  Fall.  Productive cough for 1 week.  EXAM: CHEST  2 VIEW  COMPARISON:  04/05/2013  FINDINGS: Sequelae of prior CABG are again identified. The cardiomediastinal silhouette is within normal limits. There is increased peribronchial thickening. Lungs remain hyperinflated. No pleural effusion or pneumothorax is identified. No segmental airspace consolidation is seen. No acute osseous abnormality is identified.  IMPRESSION: Peribronchial thickening, query bronchitis.   Electronically Signed   By: Sebastian Ache   On: 11/28/2013 17:49   Dg Hip Complete Left  11/28/2013   CLINICAL DATA:  Left hip pain  EXAM: LEFT HIP - COMPLETE 2+ VIEW  COMPARISON:  None.  FINDINGS: The patient is status post left hip arthroplasty. There is no  acute fracture or subluxation identified. Mild to moderate degenerative changes involve the right hip. There is no evidence of hip fracture or dislocation. There is no evidence of arthropathy or other focal bone abnormality.  IMPRESSION: 1. No acute findings. 2. Previous left hip arthroplasty.   Electronically Signed   By: Signa Kellaylor  Stroud M.D.   On: 11/28/2013 17:36   Ct Head Wo Contrast  11/28/2013   CLINICAL DATA:  Larey SeatFell while walking, dizziness, abrasion to left eyebrow  EXAM: CT HEAD WITHOUT CONTRAST  CT CERVICAL SPINE WITHOUT  CONTRAST  TECHNIQUE: Multidetector CT imaging of the head and cervical spine was performed following the standard protocol without intravenous contrast. Multiplanar CT image reconstructions of the cervical spine were also generated.  COMPARISON:  CT head 07/24/2012  FINDINGS: CT HEAD FINDINGS  Motion artifacts, for which repeat imaging was performed.  Generalized atrophy.  Normal ventricular morphology.  No midline shift or mass effect.  Small vessel chronic ischemic changes of deep cerebral white matter.  No intracranial hemorrhage, mass lesion, or acute infarction.  Scattered mucosal thickening in paranasal sinuses.  Calvaria intact.  Atherosclerotic calcifications the carotid siphons.  Bones unremarkable.  CT CERVICAL SPINE FINDINGS  Extensive carotid atherosclerotic calcifications most prominent at bifurcations.  Prevertebral soft tissues normal thickness.  Diffuse osseous demineralization.  Tips of lung apices clear.  Visualized skullbase intact.  Incomplete posterior arch C1, normal variant.  3.6 mm retrolisthesis at C3-C4.  Minimal anterolisthesis at C4-C5.  Scattered degenerative disc disease changes greatest at C3-C4 and C5-C6.  Vertebral body heights maintained without fracture or bone destruction.  Multilevel facet degenerative changes bilaterally.  IMPRESSION: Atrophy with small vessel chronic ischemic changes of deep cerebral white matter.  No acute intracranial abnormalities.  Multilevel degenerative disc and facet disease changes cervical spine as above.  Extensive carotid vascular calcification.  No acute cervical spine abnormalities.   Electronically Signed   By: Ulyses SouthwardMark  Boles M.D.   On: 11/28/2013 17:18   Ct Cervical Spine Wo Contrast  11/28/2013   CLINICAL DATA:  Larey SeatFell while walking, dizziness, abrasion to left eyebrow  EXAM: CT HEAD WITHOUT CONTRAST  CT CERVICAL SPINE WITHOUT CONTRAST  TECHNIQUE: Multidetector CT imaging of the head and cervical spine was performed following the standard protocol  without intravenous contrast. Multiplanar CT image reconstructions of the cervical spine were also generated.  COMPARISON:  CT head 07/24/2012  FINDINGS: CT HEAD FINDINGS  Motion artifacts, for which repeat imaging was performed.  Generalized atrophy.  Normal ventricular morphology.  No midline shift or mass effect.  Small vessel chronic ischemic changes of deep cerebral white matter.  No intracranial hemorrhage, mass lesion, or acute infarction.  Scattered mucosal thickening in paranasal sinuses.  Calvaria intact.  Atherosclerotic calcifications the carotid siphons.  Bones unremarkable.  CT CERVICAL SPINE FINDINGS  Extensive carotid atherosclerotic calcifications most prominent at bifurcations.  Prevertebral soft tissues normal thickness.  Diffuse osseous demineralization.  Tips of lung apices clear.  Visualized skullbase intact.  Incomplete posterior arch C1, normal variant.  3.6 mm retrolisthesis at C3-C4.  Minimal anterolisthesis at C4-C5.  Scattered degenerative disc disease changes greatest at C3-C4 and C5-C6.  Vertebral body heights maintained without fracture or bone destruction.  Multilevel facet degenerative changes bilaterally.  IMPRESSION: Atrophy with small vessel chronic ischemic changes of deep cerebral white matter.  No acute intracranial abnormalities.  Multilevel degenerative disc and facet disease changes cervical spine as above.  Extensive carotid vascular calcification.  No acute cervical spine abnormalities.   Electronically Signed  By: Ulyses Southward M.D.   On: 11/28/2013 17:18   Ct Hip Left Wo Contrast  11/28/2013   CLINICAL DATA:  Fall.  Left hip pain appear  EXAM: CT OF THE LEFT HIP WITHOUT CONTRAST  TECHNIQUE: Multidetector CT imaging was performed according to the standard protocol. Multiplanar CT image reconstructions were also generated.  COMPARISON:  DG HIP COMPLETE*L* dated 11/28/2013  FINDINGS: Patient status post total left hip replacement. Good anatomic alignment noted. No evidence  of loosening. No acute bony abnormality. No evidence of fracture. Atherosclerotic vascular calcification of the iliofemoral vessels.  IMPRESSION: 1. No acute bony abnormality. No evidence of fracture all dislocation. 2. Patient a prior total left hip replacement with good anatomic alignment.   Electronically Signed   By: Maisie Fus  Register   On: 11/28/2013 19:37    Scheduled Meds: . aspirin EC  81 mg Oral Daily  . clopidogrel  75 mg Oral Q breakfast  . ezetimibe  10 mg Oral Daily  . insulin aspart  0-9 Units Subcutaneous TID WC  . levofloxacin  500 mg Oral Q48H  . loratadine  10 mg Oral Daily  . metoprolol succinate  12.5 mg Oral Daily  . pantoprazole  40 mg Oral Daily  . rosuvastatin  20 mg Oral q1800  . tamsulosin  0.4 mg Oral Daily  . venlafaxine XR  75 mg Oral QHS  . vitamin C  500 mg Oral Daily  . [START ON 12/02/2013] Vitamin D (Ergocalciferol)  50,000 Units Oral Q Fri  . zolpidem  5 mg Oral QHS   Continuous Infusions: . sodium chloride 75 mL/hr at 11/28/13 2321   Antibiotics Given (last 72 hours)   None      Principal Problem:   Contusion of left hip Active Problems:   HTN (hypertension)   DM (diabetes mellitus)   CAD (coronary artery disease)   Acute renal failure   Left hip pain    Time spent: 35 min    Eddie Koc  [facility] Pager [phone] If 7PM-7AM, please contact night-coverage at [email or web address], password [facility] 11/29/2013, 10:44 AM  LOS: 1 day

## 2013-11-29 NOTE — Progress Notes (Addendum)
PCCM  Asked to check at bedside to see if this pt needed TPA.  On my exam I find this pt to be in ARDS, with PNA and acute hypoxemic resp failure.  I do not feel TPA is warranted , I am ok with continuing heparin drip for now.  I also extensively updated the patients daughter at the bedside.  Luisa HartPatrick WrightMD

## 2013-11-29 NOTE — Consult Note (Signed)
PULMONARY / CRITICAL CARE MEDICINE   Name: Ronald Frank MRN: [id number] DOB: 12/25/1934    ADMISSION DATE:  11/28/2013 CONSULTATION DATE:  11/29/2013  REFERRING MD :  Ronald Frank PRIMARY SERVICE: PCCM  CHIEF COMPLAINT:  Resp Distress  BRIEF PATIENT DESCRIPTION: 79 y.o. M admitted 3/16 for fall, suffered L hip contusion with no fx or prosthesis displacment noted on CT of hip.  Developed acute onset tachypnea, tachycardia, hypoxia 3/17.  RRT paged and PCCM called for transfer to ICU.  SIGNIFICANT EVENTS / STUDIES:  3/16 - admitted after mechanical fall at home. 3/17 - developed respiratory distress with RR 44, HR 130, SpO2 91%.   LINES / TUBES: ETT 3/17 >>  L [place] CVL 3/17 >>   CULTURES: Blood 3/17 >>  Urine 3/17 >>  Resp 3/17 >>   ANTIBIOTICS: Ceftriaxone 3/17 x 1 Vanc 3/17 >>> Zosyn 3/17 >>>  HISTORY OF PRESENT ILLNESS:  Ronald Frank is a 79 y.o. M with PMH of DM, COPD, hypertension, hyperlipidemia, previous stroke, and presented to [facility] ED via EMS on 3/16 after he sustained a mechanical fall at his house.  After the fall, pt developed left hip pain which progressively worsened.  Pain aggravated with activity, somewhat relieved with rest.  Per ED notes, pt did not lose consciousness at the time of fall.  Imaging done revealed no acute fracture; however, pt was admitted since he was unable to ambulate because of pain. On 3/17, RN was rounding on pt after lunch and noted that he was sitting in chair in significant respiratory distress.  RR was 44, HR 130, initial SpO2 91% on RA. RRT was paged, stat 12 lead EKG, ABG, CXR.  12 lead unchanged from prior recordings.  ABG demonstrated hypoxia, low pCO2, however pH normal.  Of note, CXR has new LLL infiltrate concerning for PNA along with elevated WBC's count since admit (11.2 - 19.7) and Lactate 3.2. During exam, pt denies chest pain or leg/calf pain and states breathing is a little better than it was; however, still a bit difficult.  He denies  headache, N/V/D, abdominal pain, worsening hip pain, urinary frequency/urgency/dysuria.  PAST MEDICAL HISTORY :  Past Medical History  Diagnosis Date  . COPD (chronic obstructive pulmonary disease)   . Hyperlipidemia   . Myocardial infarction 2003  . Arthritis   . Joint pain   . Bronchitis   . Productive cough   . Anxiety   . Diabetes mellitus without complication   . Stroke   . GERD (gastroesophageal reflux disease)    Past Surgical History  Procedure Laterality Date  . Total hip arthroplasty  2005  . Coronary artery bypass graft  2003  . Femoral artery - femoral artery bypass graft  2003    right to left  . Lumbar disc surgery      x2 1999 & 2000  . Aortagram  01/20/2012    Abdominal Aortagram  . [place] Dates without cardioversion  07/27/2012    Procedure: TRANSESOPHAGEAL ECHOCARDIOGRAM (TEE);  Surgeon: Lelon Perla, MD;  Location: [facility] ENDOSCOPY;  Service: Cardiovascular;  Laterality: N/A;   Prior to Admission medications   Medication Sig Start Date End Date Taking? Authorizing Provider  aspirin EC 81 MG tablet Take 81 mg by mouth daily.     Yes Historical Provider, MD  clonazePAM (KLONOPIN) 0.5 MG tablet Take 0.5 mg by mouth 2 (two) times daily as needed for anxiety.   Yes Historical Provider, MD  clopidogrel (PLAVIX) 75 MG tablet Take 75 mg  by mouth daily.   Yes Historical Provider, MD  diphenhydrAMINE (BENADRYL) 25 MG tablet Take 25 mg by mouth at bedtime as needed for sleep.   Yes Historical Provider, MD  ezetimibe (ZETIA) 10 MG tablet Take 10 mg by mouth daily.     Yes Historical Provider, MD  fexofenadine (ALLEGRA) 180 MG tablet Take 180 mg by mouth daily.   Yes Historical Provider, MD  HYDROcodone-acetaminophen (NORCO/VICODIN) 5-325 MG per tablet Take 1 tablet by mouth every 6 (six) hours as needed for pain.   Yes Historical Provider, MD  LORazepam (ATIVAN) 0.5 MG tablet Take 0.5 mg by mouth 2 (two) times daily as needed for anxiety.    Yes Historical Provider, MD  metFORMIN  (GLUCOPHAGE) 500 MG tablet Take 500 mg by mouth 2 (two) times daily with a meal.     Yes Historical Provider, MD  methocarbamol (ROBAXIN) 500 MG tablet Take 500 mg by mouth 2 (two) times daily as needed for muscle spasms.  03/16/12  Yes Historical Provider, MD  metoprolol succinate (TOPROL-XL) 25 MG 24 hr tablet Take 12.5 mg by mouth daily.   Yes Historical Provider, MD  Multiple Vitamins-Minerals (EYE VITAMINS PO) Take 1 tablet by mouth 2 (two) times daily.    Yes Historical Provider, MD  nitroGLYCERIN (NITROSTAT) 0.4 MG SL tablet Place 0.4 mg under the tongue every 5 (five) minutes as needed for chest pain.   Yes Historical Provider, MD  omeprazole (PRILOSEC) 20 MG capsule Take 20 mg by mouth 2 (two) times daily before a meal.   Yes Historical Provider, MD  rosuvastatin (CRESTOR) 40 MG tablet Take 20 mg by mouth daily.    Yes Historical Provider, MD  Tamsulosin HCl (FLOMAX) 0.4 MG CAPS Take 0.4 mg by mouth daily.    Yes Historical Provider, MD  venlafaxine XR (EFFEXOR-XR) 75 MG 24 hr capsule Take 75 mg by mouth at bedtime.    Yes Historical Provider, MD  vitamin C (ASCORBIC ACID) 500 MG tablet Take 500 mg by mouth daily.     Yes Historical Provider, MD  Vitamin D, Ergocalciferol, (DRISDOL) 50000 UNITS CAPS Take 50,000 Units by mouth every 7 (seven) days. On Fridays   Yes Historical Provider, MD  zolpidem (AMBIEN) 10 MG tablet Take 10 mg by mouth at bedtime.    Yes Historical Provider, MD  albuterol (PROVENTIL HFA;VENTOLIN HFA) 108 (90 BASE) MCG/ACT inhaler Inhale 2 puffs into the lungs every 6 (six) hours as needed. For wheezing 10/23/11 10/22/12  Tanda Rockers, MD   Allergies  Allergen Reactions  . Actos [Pioglitazone Hydrochloride] Other (See Comments)    unkknown  . Erythromycin Hives and Itching  . Escitalopram Oxalate Hives and Itching  . Lipitor [Atorvastatin Calcium] Other (See Comments)    Weakness   . Omnipaque [Iohexol] Other (See Comments)    unknown  . Pletaal [Cilostazol] Other  (See Comments)    unknown    FAMILY HISTORY:  History reviewed. No pertinent family history. SOCIAL HISTORY:  reports that he quit smoking about 12 years ago. His smoking use included Cigarettes. He smoked 0.00 packs per day. He has never used smokeless tobacco. He reports that he does not drink alcohol or use illicit drugs.  REVIEW OF SYSTEMS:  Negative except as stated in HPI.  SUBJECTIVE:    VITAL SIGNS: Temp:  [97.7 F (36.5 C)-98.3 F (36.8 C)] 98.3 F (36.8 C) (03/17 1938) Pulse Rate:  [90-144] 104 (03/17 1940) Resp:  [15-48] 25 (03/17 1940) BP: (116-196)/(50-132) 151/132  mmHg (03/17 1940) SpO2:  [73 %-93 %] 90 % (03/17 1955) FiO2 (%):  [50 %-100 %] 100 % (03/17 1955) Weight:  [99 kg (218 lb 4.1 oz)] 99 kg (218 lb 4.1 oz) (03/17 2000) HEMODYNAMICS:   VENTILATOR SETTINGS: Vent Mode:  [-] PCV FiO2 (%):  [50 %-100 %] 100 % Set Rate:  [16 bmp] 16 bmp Vt Set:  [500 mL] 500 mL PEEP:  [5 cmH20-12 cmH20] 12 cmH20 Plateau Pressure:  [15 JKD32-67 cmH20] 24 cmH20 INTAKE / OUTPUT: Intake/Output     03/17 0701 - 03/18 0700   P.O. 120   Total Intake(mL/kg) 120 (1.2)   Urine (mL/kg/hr) 25 (0)   Total Output 25   Net +95         PHYSICAL EXAMINATION: General: Extremely agitated, poorly oriented, tachypneic prior to intubation. Neuro: A&O to person only, non-focal.  HEENT: [place]/AT. PERRL, sclerae anicteric. Cardiovascular: tachy but regular, no M/R/G.  Lungs: Tachypneic with increased work of breathing.  No retractions or accessory muscle use.  CTA bilaterally, No W/R/R. Abdomen: BS x 4, soft, NT/ND.  Ext: Warm without edema, L hip exquisitely painful to touch or movement Skin: no rashes.    LABS:  CBC  Recent Labs Lab 11/28/13 1635 11/29/13 0755  WBC 11.2* 19.7*  HGB 11.8* 11.9*  HCT 34.7* 34.7*  PLT 209 206   Coag's No results found for this basename: APTT, INR,  in the last 168 hours BMET  Recent Labs Lab 11/28/13 1635 11/29/13 0755  NA 136* 139  K  4.2 4.1  CL 96 100  CO2 22 19  BUN 34* 26*  CREATININE 1.96* 1.23  GLUCOSE 130* 135*   Electrolytes  Recent Labs Lab 11/28/13 1635 11/29/13 0755  CALCIUM 8.7 8.5   Sepsis Markers  Recent Labs Lab 11/29/13 1348  LATICACIDVEN 3.2*   ABG  Recent Labs Lab 11/29/13 1348 11/29/13 1915  PHART 7.427 7.360  PCO2ART 28.8* 31.1*  PO2ART 70.0* 59.0*   Liver Enzymes No results found for this basename: AST, ALT, ALKPHOS, BILITOT, ALBUMIN,  in the last 168 hours Cardiac Enzymes  Recent Labs Lab 11/29/13 1331  TROPONINI <0.30   Glucose  Recent Labs Lab 11/28/13 1831 11/29/13 0725 11/29/13 1126 11/29/13 1619 11/29/13 1930  GLUCAP 100* 144* 150* 145* 171*    CXR: L>R interstitial prominence  ASSESSMENT / PLAN:  PULMONARY A: Acute Respiratory Failure of sudden onset - etiology unclear  Possible HAP Possible asymmetric edema Concern for PE given sudden onset and clinical/radiographic disparity P:   Intubation performed Vent settings established Vent bundle implemented Daily SBT when indicated Empiric full dose UFH LE venous Doppler studies Consider CTA 3/18 noting documented allergy to Omnipaque. Scheduled BDs ordered  CARDIOVASCULAR A: Chronic RBBB H/O CAD H/O HTN Chronic beta blocker use P: R/O MI with serial biomarkers EKG AM 3/18 Check BNP now and AM 3/18 Echocardiogram ordered. Scheduled low dose metoprolol Continue Crestor, ASA, Clopidogrel.   RENAL A:  AKI, improving P:   Monitor BMET intermittently Monitor I/Os Correct electrolytes as indicated  GASTROINTESTINAL A:   No acute issues P:   SUP: IV pantoprazole Consider TFs in AM 3/18  HEMATOLOGIC A:   No acute issues. Concern for VTE P:  DVT px: full dose UFH Monitor CBC intermittently Transfuse per usual ICU guidelines  INFECTIOUS A:  Leukocytosis Suspected severe sepsis Concern for PNA. P:   Micor and abx as above PCT algorithm.   ENDOCRINE A:  DM 2 P:  SSI protocol ordered  NEUROLOGIC A:  Profound agitated delirium/acute encephalopathy Vent dyssynchrony Severe L hip pain - out of proportion to CT findings   ? Missed fx or prosthesis displacement P:   Dex and fentanyl ordered Cisatracurium until resp status stabilizes and sedation goals met Consider re-imaging of L hip Daily WUA when indicated   Wife was notified of ICU transfer by floor team. She was not present at time of my eval  45 mins CCM time  Montey Hora, [place] Pgr: [phone]  or [phone]    I have personally obtained a history, examined the patient, evaluated laboratory and imaging results, formulated the assessment and plan and placed orders. CRITICAL CARE: The patient is critically ill with multiple organ systems failure and requires high complexity decision making for assessment and support, frequent evaluation and titration of therapies, application of advanced monitoring technologies and extensive interpretation of multiple databases. Critical Care Time devoted to patient care services described in this note is 45 minutes.   Merton Border, MD ; [facility] [phone].  After 5:30 PM or weekends, call [phone] Pulmonary and [place] Pager: [phone]  11/29/2013, 8:43 PM

## 2013-11-29 NOTE — H&P (Deleted)
PULMONARY / CRITICAL CARE MEDICINE   Name: Ronald Frank MRN: [id number] DOB: 10-Jan-1935    ADMISSION DATE:  11/28/2013 CONSULTATION DATE:  11/29/2013  REFERRING MD :  Benjamine Mola PRIMARY SERVICE: PCCM  CHIEF COMPLAINT:  Resp Distress  BRIEF PATIENT DESCRIPTION: 79 y.o. M admitted 3/16 for fall, suffered L hip contusion with no fx.  Developed acute onset tachypnea, tachycardia, hypoxia 3/17.  RRT paged and PCCM called for transfer to ICU.  SIGNIFICANT EVENTS / STUDIES:  3/16 - admitted after mechanical fall at home. 3/17 - developed respiratory distress with RR 44, HR 130, SpO2 91%.   LINES / TUBES: None  CULTURES: Urine 3/17 >>>  ANTIBIOTICS: Ceftriaxone 3/17 x 1 Vanc 3/17 >>> Zosyn 3/17 >>>  HISTORY OF PRESENT ILLNESS:  Ronald Frank is a 79 y.o. M with PMH of DM, COPD, hypertension, hyperlipidemia, previous stroke, and presented to [facility] ED via EMS on 3/16 after he sustained a mechanical fall at his house.  After the fall, pt developed left hip pain which progressively worsened.  Pain aggravated with activity, somewhat relieved with rest.  Per ED notes, pt did not lose consciousness at the time of fall.  Imaging done revealed no acute fracture; however, pt was admitted since he was unable to ambulate because of pain. On 3/17, RN was rounding on pt after lunch and noted that he was sitting in chair in significant respiratory distress.  RR was 44, HR 130, initial SpO2 91% on RA. RRT was paged, stat 12 lead EKG, ABG, CXR.  12 lead unchanged from prior recordings.  ABG demonstrated hypoxia, low pCO2, however pH normal.  Of note, CXR has new LLL infiltrate concerning for PNA along with elevated WBC's count since admit (11.2 - 19.7) and Lactate 3.2. During exam, pt denies chest pain or leg/calf pain and states breathing is a little better than it was; however, still a bit difficult.  He denies headache, N/V/D, abdominal pain, worsening hip pain, urinary frequency/urgency/dysuria.  PAST MEDICAL  HISTORY :  Past Medical History  Diagnosis Date  . COPD (chronic obstructive pulmonary disease)   . Hyperlipidemia   . Myocardial infarction 2003  . Arthritis   . Joint pain   . Bronchitis   . Productive cough   . Anxiety   . Diabetes mellitus without complication   . Stroke   . GERD (gastroesophageal reflux disease)    Past Surgical History  Procedure Laterality Date  . Total hip arthroplasty  2005  . Coronary artery bypass graft  2003  . Femoral artery - femoral artery bypass graft  2003    right to left  . Lumbar disc surgery      x2 1999 & 2000  . Aortagram  01/20/2012    Abdominal Aortagram  . Rhae Hammock without cardioversion  07/27/2012    Procedure: TRANSESOPHAGEAL ECHOCARDIOGRAM (TEE);  Surgeon: Lewayne Bunting, MD;  Location: [facility] ENDOSCOPY;  Service: Cardiovascular;  Laterality: N/A;   Prior to Admission medications   Medication Sig Start Date End Date Taking? Authorizing Provider  aspirin EC 81 MG tablet Take 81 mg by mouth daily.     Yes Historical Provider, MD  clonazePAM (KLONOPIN) 0.5 MG tablet Take 0.5 mg by mouth 2 (two) times daily as needed for anxiety.   Yes Historical Provider, MD  clopidogrel (PLAVIX) 75 MG tablet Take 75 mg by mouth daily.   Yes Historical Provider, MD  diphenhydrAMINE (BENADRYL) 25 MG tablet Take 25 mg by mouth at bedtime as needed for sleep.  Yes Historical Provider, MD  ezetimibe (ZETIA) 10 MG tablet Take 10 mg by mouth daily.     Yes Historical Provider, MD  fexofenadine (ALLEGRA) 180 MG tablet Take 180 mg by mouth daily.   Yes Historical Provider, MD  HYDROcodone-acetaminophen (NORCO/VICODIN) 5-325 MG per tablet Take 1 tablet by mouth every 6 (six) hours as needed for pain.   Yes Historical Provider, MD  LORazepam (ATIVAN) 0.5 MG tablet Take 0.5 mg by mouth 2 (two) times daily as needed for anxiety.    Yes Historical Provider, MD  metFORMIN (GLUCOPHAGE) 500 MG tablet Take 500 mg by mouth 2 (two) times daily with a meal.     Yes Historical  Provider, MD  methocarbamol (ROBAXIN) 500 MG tablet Take 500 mg by mouth 2 (two) times daily as needed for muscle spasms.  03/16/12  Yes Historical Provider, MD  metoprolol succinate (TOPROL-XL) 25 MG 24 hr tablet Take 12.5 mg by mouth daily.   Yes Historical Provider, MD  Multiple Vitamins-Minerals (EYE VITAMINS PO) Take 1 tablet by mouth 2 (two) times daily.    Yes Historical Provider, MD  nitroGLYCERIN (NITROSTAT) 0.4 MG SL tablet Place 0.4 mg under the tongue every 5 (five) minutes as needed for chest pain.   Yes Historical Provider, MD  omeprazole (PRILOSEC) 20 MG capsule Take 20 mg by mouth 2 (two) times daily before a meal.   Yes Historical Provider, MD  rosuvastatin (CRESTOR) 40 MG tablet Take 20 mg by mouth daily.    Yes Historical Provider, MD  Tamsulosin HCl (FLOMAX) 0.4 MG CAPS Take 0.4 mg by mouth daily.    Yes Historical Provider, MD  venlafaxine XR (EFFEXOR-XR) 75 MG 24 hr capsule Take 75 mg by mouth at bedtime.    Yes Historical Provider, MD  vitamin C (ASCORBIC ACID) 500 MG tablet Take 500 mg by mouth daily.     Yes Historical Provider, MD  Vitamin D, Ergocalciferol, (DRISDOL) 50000 UNITS CAPS Take 50,000 Units by mouth every 7 (seven) days. On Fridays   Yes Historical Provider, MD  zolpidem (AMBIEN) 10 MG tablet Take 10 mg by mouth at bedtime.    Yes Historical Provider, MD  albuterol (PROVENTIL HFA;VENTOLIN HFA) 108 (90 BASE) MCG/ACT inhaler Inhale 2 puffs into the lungs every 6 (six) hours as needed. For wheezing 10/23/11 10/22/12  Nyoka Cowden, MD   Allergies  Allergen Reactions  . Actos [Pioglitazone Hydrochloride] Other (See Comments)    unkknown  . Erythromycin Hives and Itching  . Escitalopram Oxalate Hives and Itching  . Lipitor [Atorvastatin Calcium] Other (See Comments)    Weakness   . Omnipaque [Iohexol] Other (See Comments)    unknown  . Pletaal [Cilostazol] Other (See Comments)    unknown    FAMILY HISTORY:  History reviewed. No pertinent family  history. SOCIAL HISTORY:  reports that he quit smoking about 12 years ago. His smoking use included Cigarettes. He smoked 0.00 packs per day. He has never used smokeless tobacco. He reports that he does not drink alcohol or use illicit drugs.  REVIEW OF SYSTEMS:  Negative except as stated in HPI.  SUBJECTIVE: Denies chest pain, increased work of breathing and at high rate. PRB in place.  VITAL SIGNS: Temp:  [97.7 F (36.5 C)-98 F (36.7 C)] 97.7 F (36.5 C) (03/17 1310) Pulse Rate:  [90-132] 127 (03/17 1404) Resp:  [18-48] 48 (03/17 1404) BP: (93-166)/(46-94) 144/72 mmHg (03/17 1404) SpO2:  [91 %-96 %] 92 % (03/17 1310) HEMODYNAMICS:   VENTILATOR  SETTINGS:   INTAKE / OUTPUT: Intake/Output     03/16 0701 - 03/17 0700 03/17 0701 - 03/18 0700   P.O.  120   Total Intake(mL/kg)  120 (1.2)   Urine (mL/kg/hr)  25 (0)   Total Output   25   Net   +95          PHYSICAL EXAMINATION: General: Elderly ill appearing male, resting in bed, in moderate distress. Neuro: A&O to person only, non-focal.  HEENT: [place]/AT. PERRL, sclerae anicteric. Cardiovascular: tachy but regular, no M/R/G.  Lungs: Tachypneic with increased work of breathing.  No retractions or accessory muscle use.  CTA bilaterally, No W/R/R. Abdomen: BS x 4, soft, NT/ND.  Musculoskeletal: No gross deformities, no edema.  Skin: Intact, warm, no rashes.    LABS:  CBC  Recent Labs Lab 11/28/13 1635 11/29/13 0755  WBC 11.2* 19.7*  HGB 11.8* 11.9*  HCT 34.7* 34.7*  PLT 209 206   Coag's No results found for this basename: APTT, INR,  in the last 168 hours BMET  Recent Labs Lab 11/28/13 1635 11/29/13 0755  NA 136* 139  K 4.2 4.1  CL 96 100  CO2 22 19  BUN 34* 26*  CREATININE 1.96* 1.23  GLUCOSE 130* 135*   Electrolytes  Recent Labs Lab 11/28/13 1635 11/29/13 0755  CALCIUM 8.7 8.5   Sepsis Markers  Recent Labs Lab 11/29/13 1348  LATICACIDVEN 3.2*   ABG  Recent Labs Lab 11/29/13 1348   PHART 7.427  PCO2ART 28.8*  PO2ART 70.0*   Liver Enzymes No results found for this basename: AST, ALT, ALKPHOS, BILITOT, ALBUMIN,  in the last 168 hours Cardiac Enzymes  Recent Labs Lab 11/29/13 1331  TROPONINI <0.30   Glucose  Recent Labs Lab 11/28/13 1831 11/29/13 0725 11/29/13 1126  GLUCAP 100* 144* 150*    Imaging Dg Chest 2 View  11/28/2013   CLINICAL DATA:  Fall.  Productive cough for 1 week.  EXAM: CHEST  2 VIEW  COMPARISON:  04/05/2013  FINDINGS: Sequelae of prior CABG are again identified. The cardiomediastinal silhouette is within normal limits. There is increased peribronchial thickening. Lungs remain hyperinflated. No pleural effusion or pneumothorax is identified. No segmental airspace consolidation is seen. No acute osseous abnormality is identified.  IMPRESSION: Peribronchial thickening, query bronchitis.   Electronically Signed   By: Sebastian Ache   On: 11/28/2013 17:49   Dg Hip Complete Left  11/28/2013   CLINICAL DATA:  Left hip pain  EXAM: LEFT HIP - COMPLETE 2+ VIEW  COMPARISON:  None.  FINDINGS: The patient is status post left hip arthroplasty. There is no acute fracture or subluxation identified. Mild to moderate degenerative changes involve the right hip. There is no evidence of hip fracture or dislocation. There is no evidence of arthropathy or other focal bone abnormality.  IMPRESSION: 1. No acute findings. 2. Previous left hip arthroplasty.   Electronically Signed   By: Signa Kell M.D.   On: 11/28/2013 17:36   Ct Head Wo Contrast  11/29/2013   CLINICAL DATA:  Change in mental status, possible stroke  EXAM: CT HEAD WITHOUT CONTRAST  TECHNIQUE: Contiguous axial images were obtained from the base of the skull through the vertex without intravenous contrast.  COMPARISON:  CT HEAD W/O CM dated 11/28/2013; CT C SPINE W/O CM dated 11/28/2013  FINDINGS: Multifocal ethmoid air cell opacification with inflammatory change also seen in the maxillary sinuses, inferior  frontal sinuses, and sphenoid sinuses with air-fluid levels in the sphenoid  sinuses. No skull fracture. No hemorrhage or extra-axial fluid. No evidence of vascular territory infarct. There is stable moderate diffuse atrophy and moderate to severe low attenuation in the deep white matter.  IMPRESSION: Chronic ischemic change, stable. Chronic sinusitis, mildly worse when compared to the prior study.   Electronically Signed   By: Esperanza Heir M.D.   On: 11/29/2013 14:39   Ct Head Wo Contrast  11/28/2013   CLINICAL DATA:  Larey Seat while walking, dizziness, abrasion to left eyebrow  EXAM: CT HEAD WITHOUT CONTRAST  CT CERVICAL SPINE WITHOUT CONTRAST  TECHNIQUE: Multidetector CT imaging of the head and cervical spine was performed following the standard protocol without intravenous contrast. Multiplanar CT image reconstructions of the cervical spine were also generated.  COMPARISON:  CT head 07/24/2012  FINDINGS: CT HEAD FINDINGS  Motion artifacts, for which repeat imaging was performed.  Generalized atrophy.  Normal ventricular morphology.  No midline shift or mass effect.  Small vessel chronic ischemic changes of deep cerebral white matter.  No intracranial hemorrhage, mass lesion, or acute infarction.  Scattered mucosal thickening in paranasal sinuses.  Calvaria intact.  Atherosclerotic calcifications the carotid siphons.  Bones unremarkable.  CT CERVICAL SPINE FINDINGS  Extensive carotid atherosclerotic calcifications most prominent at bifurcations.  Prevertebral soft tissues normal thickness.  Diffuse osseous demineralization.  Tips of lung apices clear.  Visualized skullbase intact.  Incomplete posterior arch C1, normal variant.  3.6 mm retrolisthesis at C3-C4.  Minimal anterolisthesis at C4-C5.  Scattered degenerative disc disease changes greatest at C3-C4 and C5-C6.  Vertebral body heights maintained without fracture or bone destruction.  Multilevel facet degenerative changes bilaterally.  IMPRESSION: Atrophy  with small vessel chronic ischemic changes of deep cerebral white matter.  No acute intracranial abnormalities.  Multilevel degenerative disc and facet disease changes cervical spine as above.  Extensive carotid vascular calcification.  No acute cervical spine abnormalities.   Electronically Signed   By: Ulyses Southward M.D.   On: 11/28/2013 17:18   Ct Cervical Spine Wo Contrast  11/28/2013   CLINICAL DATA:  Larey Seat while walking, dizziness, abrasion to left eyebrow  EXAM: CT HEAD WITHOUT CONTRAST  CT CERVICAL SPINE WITHOUT CONTRAST  TECHNIQUE: Multidetector CT imaging of the head and cervical spine was performed following the standard protocol without intravenous contrast. Multiplanar CT image reconstructions of the cervical spine were also generated.  COMPARISON:  CT head 07/24/2012  FINDINGS: CT HEAD FINDINGS  Motion artifacts, for which repeat imaging was performed.  Generalized atrophy.  Normal ventricular morphology.  No midline shift or mass effect.  Small vessel chronic ischemic changes of deep cerebral white matter.  No intracranial hemorrhage, mass lesion, or acute infarction.  Scattered mucosal thickening in paranasal sinuses.  Calvaria intact.  Atherosclerotic calcifications the carotid siphons.  Bones unremarkable.  CT CERVICAL SPINE FINDINGS  Extensive carotid atherosclerotic calcifications most prominent at bifurcations.  Prevertebral soft tissues normal thickness.  Diffuse osseous demineralization.  Tips of lung apices clear.  Visualized skullbase intact.  Incomplete posterior arch C1, normal variant.  3.6 mm retrolisthesis at C3-C4.  Minimal anterolisthesis at C4-C5.  Scattered degenerative disc disease changes greatest at C3-C4 and C5-C6.  Vertebral body heights maintained without fracture or bone destruction.  Multilevel facet degenerative changes bilaterally.  IMPRESSION: Atrophy with small vessel chronic ischemic changes of deep cerebral white matter.  No acute intracranial abnormalities.   Multilevel degenerative disc and facet disease changes cervical spine as above.  Extensive carotid vascular calcification.  No acute cervical spine abnormalities.  Electronically Signed   By: Ulyses SouthwardMark  Boles M.D.   On: 11/28/2013 17:18   Ct Hip Left Wo Contrast  11/28/2013   CLINICAL DATA:  Fall.  Left hip pain appear  EXAM: CT OF THE LEFT HIP WITHOUT CONTRAST  TECHNIQUE: Multidetector CT imaging was performed according to the standard protocol. Multiplanar CT image reconstructions were also generated.  COMPARISON:  DG HIP COMPLETE*L* dated 11/28/2013  FINDINGS: Patient status post total left hip replacement. Good anatomic alignment noted. No evidence of loosening. No acute bony abnormality. No evidence of fracture. Atherosclerotic vascular calcification of the iliofemoral vessels.  IMPRESSION: 1. No acute bony abnormality. No evidence of fracture all dislocation. 2. Patient a prior total left hip replacement with good anatomic alignment.   Electronically Signed   By: Maisie Fushomas  Register   On: 11/28/2013 19:37   Dg Chest Port 1 View  11/29/2013   CLINICAL DATA:  Shortness of breath  EXAM: PORTABLE CHEST - 1 VIEW  COMPARISON:  Chest x-ray of 11/28/2013  FINDINGS: There are now more prominent markings throughout the left mid and lower lung field, suspicious for pneumonia. The right lung is clear. No effusion is seen. Heart size is stable. Median sternotomy sutures are noted from prior CABG.  IMPRESSION: Poor prominent markings in the left mid and lower lung field, suspicious for pneumonia. Recommend followup.   Electronically Signed   By: Dwyane DeePaul  Barry M.D.   On: 11/29/2013 15:26    ASSESSMENT / PLAN:  PULMONARY A: Acute Respiratory Failure - etiology unclear at this point.  Given sudden onset of decline along with immobility from hip pain, PE is certainly high on differential.  New elevated white count and CXR changes concerning for PNA.  CXR could represent unilateral edema however.    P:   - Given worsening  respiratory distress and increased agitation, will go ahead and intubate now. - Daily SBT's. - Consider CTA although note unknown allergy to Omnipaque. - LE Dopplers. - Heparin for possible PE/VTE prophylaxis. - Scheduled Albuterol. - CXR in AM.  CARDIOVASCULAR A: R/o MI CAD HTN P:  - BNP now and in AM. - Trend troponins. - Echo. - Lopressor . - Continue Crestor, ASA, Clopidogrel.  - Consider repeat lactate in AM. - Repeat EKG in AM.  RENAL A:  AKI P:   - Saline lock. - Insert Foley. - BMP in AM.  GASTROINTESTINAL A:   NPO P:   - SUP: pantoprazole.  HEMATOLOGIC A:   No acute issues. P:  - CBC in AM.  INFECTIOUS A:  Concern for PNA. P:   - Continue Vanc/Zosyn. - PCT algorithm. - Monitor WBC's/fever curve.  ENDOCRINE A:  DM P:   - SSI.  NEUROLOGIC A:  Profound Delirium Agitation P:   - Fentanyl/Precedex for sedation. - Haldol PRN. - Fentanyl PRN for pain. - Daily WUA.    Ronald Guysahul Ramez Arrona, PA - [facility] [place] [facility] Pgr: [phone]  or [phone]    I have personally obtained a history, examined the patient, evaluated laboratory and imaging results, formulated the assessment and plan and placed orders. CRITICAL CARE: The patient is critically ill with multiple organ systems failure and requires high complexity decision making for assessment and support, frequent evaluation and titration of therapies, application of advanced monitoring technologies and extensive interpretation of multiple databases. Critical Care Time devoted to patient care services described in this note is ___ minutes.    Pulmonary and Critical Care Medicine  [facility] Pager: [phone]  11/29/2013, 4:26 PM

## 2013-11-29 NOTE — Progress Notes (Signed)
Changes made post ABG per MD order and ARDS protocol. Sputum sample sent to lab at this time also.

## 2013-11-29 NOTE — Progress Notes (Signed)
eLink Physician-Brief Progress Note Patient Name: Ronald HeapsHenry T Frank DOB: 09/19/1934 MRN: [id number]  Date of Service  11/29/2013   HPI/Events of Note   Severe hypoxia Vent asynchromny Shock  eICU Interventions  Echo stat - called cardiology Levophed gtt Nimbex gtt Start versed for deeper sedation   Intervention Category Major Interventions: Hypoxemia - evaluation and management  Analaura Messler V. 11/29/2013, 7:37 PM

## 2013-11-29 NOTE — Progress Notes (Signed)
ANTICOAGULATION CONSULT NOTE - Follow Up Consult  Pharmacy Consult for Heparin Indication: rule-out DVT  Allergies  Allergen Reactions  . Actos [Pioglitazone Hydrochloride] Other (See Comments)    unkknown  . Erythromycin Hives and Itching  . Escitalopram Oxalate Hives and Itching  . Lipitor [Atorvastatin Calcium] Other (See Comments)    Weakness   . Omnipaque [Iohexol] Other (See Comments)    unknown  . Pletaal [Cilostazol] Other (See Comments)    unknown    Patient Measurements: Height: 5\' 10"  (177.8 cm) Weight: 216 lb (97.977 kg) IBW/kg (Calculated) : 73  Vital Signs: Temp: 97.7 F (36.5 C) (03/17 1310) BP: 144/72 mmHg (03/17 1404) Pulse Rate: 127 (03/17 1404)  Labs:  Recent Labs  11/28/13 1635 11/29/13 0755 11/29/13 1331  HGB 11.8* 11.9*  --   HCT 34.7* 34.7*  --   PLT 209 206  --   CREATININE 1.96* 1.23  --   TROPONINI  --   --  <0.30    Estimated Creatinine Clearance: 59 ml/min (by C-G formula based on Cr of 1.23).   Medications:  Infusions:    Assessment: 79 year old male admitted with a fall, now with elevated HR, RR, and new delirium.  He is to begin anticoagulation with Heparin for rule-out VTE.  Goal of Therapy:  Heparin level 0.3-0.7 units/ml Monitor platelets by anticoagulation protocol: Yes   Plan:  Heparin 4000 units IV bolus x 1 Start Heparin infusion at 1600 units/hr Check Heparin level and CBC in 8 hours and daily Check baseline coag studies  Estella HuskMichelle Jonaven Hilgers, Pharm.D., BCPS, AAHIVP Clinical Pharmacist Phone: [phone] or [phone] 11/29/2013, 5:12 PM

## 2013-11-29 NOTE — Procedures (Signed)
Central Venous Catheter Insertion Procedure Note Melvia HeapsHenry T Seliga [id number] 12/12/1934  Procedure: Insertion of Central Venous Catheter Indications: Assessment of intravascular volume, Drug and/or fluid administration and Frequent blood sampling  Procedure Details Consent: Unable to obtain consent because of altered level of consciousness. Time Out: Verified patient identification, verified procedure, site/side was marked, verified correct patient position, special equipment/implants available, medications/allergies/relevent history reviewed, required imaging and test results available.  Performed  Maximum sterile technique was used including antiseptics, cap, gloves, gown, hand hygiene, mask and sheet. Skin prep: Chlorhexidine; local anesthetic administered A antimicrobial bonded/coated triple lumen catheter was placed in the right subclavian vein using the Seldinger technique.  Evaluation Blood flow good Complications: No apparent complications Patient did tolerate procedure well. Chest X-ray ordered to verify placement.  CXR: CVL well positioned. No PTX.  Rutherford Guysahul Desai, PA - C [place] [facility] Pgr: [phone]  or [phone]  I was present for and supervised the entire procedure  Billy Fischeravid Simonds, MD ; [facility] service Mobile [phone].  After 5:30 PM or weekends, call [phone]

## 2013-11-29 NOTE — Procedures (Signed)
Arterial Catheter Insertion Procedure Note Ronald Frank [id number] 05/10/1935  Procedure: Insertion of Arterial Catheter  Indications: Blood pressure monitoring  Procedure Details Consent: Unable to obtain consent because of altered level of consciousness. Time Out: Verified patient identification, verified procedure, site/side was marked, verified correct patient position, special equipment/implants available, medications/allergies/relevent history reviewed, required imaging and test results available.   11/29/13 2200  Maximum sterile technique was used including antiseptics, cap, gloves, gown, hand hygiene, mask and sheet. Skin prep: Chlorhexidine; local anesthetic administered  20 gauge catheter was inserted into right radial artery using the Seldinger technique.  Evaluation Blood flow good; BP tracing good. Complications: No apparent complications.   Ronald Frank, Ronald Frank Ronald Frank 11/29/2013

## 2013-11-29 NOTE — Progress Notes (Signed)
Patient medicated with 2mg   Versed, 100 mcq Fentanyl, 50mg  of Rocuronium, 20mg  of Etimodate, prior to intubated of patient.

## 2013-11-30 ENCOUNTER — Inpatient Hospital Stay (HOSPITAL_COMMUNITY): Payer: Medicare Other

## 2013-11-30 DIAGNOSIS — F411 Generalized anxiety disorder: Secondary | ICD-10-CM

## 2013-11-30 LAB — HEPARIN LEVEL (UNFRACTIONATED)
Heparin Unfractionated: 0.56 IU/mL (ref 0.30–0.70)
Heparin Unfractionated: 0.81 IU/mL — ABNORMAL HIGH (ref 0.30–0.70)
Heparin Unfractionated: 0.67 IU/mL (ref 0.30–0.70)

## 2013-11-30 LAB — BASIC METABOLIC PANEL
BUN: 35 mg/dL — ABNORMAL HIGH (ref 6–23)
BUN: 36 mg/dL — ABNORMAL HIGH (ref 6–23)
BUN: 39 mg/dL — ABNORMAL HIGH (ref 6–23)
CHLORIDE: 104 meq/L (ref 96–112)
CO2: 20 meq/L (ref 19–32)
CO2: 21 mEq/L (ref 19–32)
CO2: 22 mEq/L (ref 19–32)
Calcium: 7.7 mg/dL — ABNORMAL LOW (ref 8.4–10.5)
Calcium: 7.9 mg/dL — ABNORMAL LOW (ref 8.4–10.5)
Calcium: 7.9 mg/dL — ABNORMAL LOW (ref 8.4–10.5)
Chloride: 104 mEq/L (ref 96–112)
Chloride: 105 mEq/L (ref 96–112)
Creatinine, Ser: 1.69 mg/dL — ABNORMAL HIGH (ref 0.50–1.35)
Creatinine, Ser: 1.81 mg/dL — ABNORMAL HIGH (ref 0.50–1.35)
Creatinine, Ser: 1.85 mg/dL — ABNORMAL HIGH (ref 0.50–1.35)
GFR calc Af Amer: 40 mL/min — ABNORMAL LOW (ref >90)
GFR calc Af Amer: 43 mL/min — ABNORMAL LOW (ref >90)
GFR calc Af Amer: 39 mL/min — ABNORMAL LOW (ref >90)
GFR calc non Af Amer: 37 mL/min — ABNORMAL LOW (ref >90)
GFR calc non Af Amer: 33 mL/min — ABNORMAL LOW (ref >90)
GFR, EST NON AFRICAN AMERICAN: 34 mL/min — AB (ref >90)
Glucose, Bld: 206 mg/dL — ABNORMAL HIGH (ref 70–99)
Glucose, Bld: 238 mg/dL — ABNORMAL HIGH (ref 70–99)
Glucose, Bld: 216 mg/dL — ABNORMAL HIGH (ref 70–99)
POTASSIUM: 4.2 meq/L (ref 3.7–5.3)
POTASSIUM: 4.9 meq/L (ref 3.7–5.3)
POTASSIUM: 4.5 meq/L (ref 3.7–5.3)
SODIUM: 138 meq/L (ref 137–147)
SODIUM: 140 meq/L (ref 137–147)
Sodium: 139 mEq/L (ref 137–147)

## 2013-11-30 LAB — POCT I-STAT 3, ART BLOOD GAS (G3+)
Acid-base deficit: 3 mmol/L — ABNORMAL HIGH (ref 0.0–2.0)
Acid-base deficit: 6 mmol/L — ABNORMAL HIGH (ref 0.0–2.0)
Acid-base deficit: 7 mmol/L — ABNORMAL HIGH (ref 0.0–2.0)
Acid-base deficit: 9 mmol/L — ABNORMAL HIGH (ref 0.0–2.0)
BICARBONATE: 19 meq/L — AB (ref 20.0–24.0)
Bicarbonate: 20.2 mEq/L (ref 20.0–24.0)
Bicarbonate: 21.9 mEq/L (ref 20.0–24.0)
Bicarbonate: 23.5 mEq/L (ref 20.0–24.0)
O2 SAT: 91 %
O2 SAT: 94 %
O2 Saturation: 94 %
O2 Saturation: 97 %
PCO2 ART: 48.4 mmHg — AB (ref 35.0–45.0)
PCO2 ART: 49.6 mmHg — AB (ref 35.0–45.0)
PCO2 ART: 55 mmHg — AB (ref 35.0–45.0)
PH ART: 7.29 — AB (ref 7.350–7.450)
PO2 ART: 88 mmHg (ref 80.0–100.0)
PO2 ART: 88 mmHg (ref 80.0–100.0)
Patient temperature: 99.3
Patient temperature: 99.5
Patient temperature: 101
TCO2: 20 mmol/L (ref 0–100)
TCO2: 22 mmol/L (ref 0–100)
TCO2: 24 mmol/L (ref 0–100)
TCO2: 25 mmol/L (ref 0–100)
pCO2 arterial: 49.1 mmHg — ABNORMAL HIGH (ref 35.0–45.0)
pH, Arterial: 7.205 — ABNORMAL LOW (ref 7.350–7.450)
pH, Arterial: 7.207 — ABNORMAL LOW (ref 7.350–7.450)
pH, Arterial: 7.224 — ABNORMAL LOW (ref 7.350–7.450)
pO2, Arterial: 104 mmHg — ABNORMAL HIGH (ref 80.0–100.0)
pO2, Arterial: 74 mmHg — ABNORMAL LOW (ref 80.0–100.0)

## 2013-11-30 LAB — GLUCOSE, CAPILLARY
GLUCOSE-CAPILLARY: 132 mg/dL — AB (ref 70–99)
GLUCOSE-CAPILLARY: 203 mg/dL — AB (ref 70–99)
GLUCOSE-CAPILLARY: 188 mg/dL — AB (ref 70–99)
Glucose-Capillary: 192 mg/dL — ABNORMAL HIGH (ref 70–99)
Glucose-Capillary: 249 mg/dL — ABNORMAL HIGH (ref 70–99)
Glucose-Capillary: 189 mg/dL — ABNORMAL HIGH (ref 70–99)

## 2013-11-30 LAB — COMPREHENSIVE METABOLIC PANEL
ALK PHOS: 54 U/L (ref 39–117)
ALT: 20 U/L (ref 0–53)
AST: 21 U/L (ref 0–37)
Albumin: 2.7 g/dL — ABNORMAL LOW (ref 3.5–5.2)
BILIRUBIN TOTAL: 0.3 mg/dL (ref 0.3–1.2)
BUN: 25 mg/dL — AB (ref 6–23)
CO2: 19 mEq/L (ref 19–32)
CREATININE: 1.48 mg/dL — AB (ref 0.50–1.35)
Calcium: 7.8 mg/dL — ABNORMAL LOW (ref 8.4–10.5)
Chloride: 104 mEq/L (ref 96–112)
GFR calc non Af Amer: 44 mL/min — ABNORMAL LOW (ref >90)
GFR, EST AFRICAN AMERICAN: 51 mL/min — AB (ref >90)
GLUCOSE: 198 mg/dL — AB (ref 70–99)
Potassium: 5.8 mEq/L — ABNORMAL HIGH (ref 3.7–5.3)
Sodium: 139 mEq/L (ref 137–147)
TOTAL PROTEIN: 5.9 g/dL — AB (ref 6.0–8.3)

## 2013-11-30 LAB — CBC
HEMATOCRIT: 34.5 % — AB (ref 39.0–52.0)
HEMOGLOBIN: 11.5 g/dL — AB (ref 13.0–17.0)
MCH: 33.3 pg (ref 26.0–34.0)
MCHC: 33.3 g/dL (ref 30.0–36.0)
MCV: 100 fL (ref 78.0–100.0)
Platelets: 228 10*3/uL (ref 150–400)
RBC: 3.45 MIL/uL — ABNORMAL LOW (ref 4.22–5.81)
RDW: 14.8 % (ref 11.5–15.5)
WBC: 27 10*3/uL — ABNORMAL HIGH (ref 4.0–10.5)

## 2013-11-30 LAB — URINE CULTURE
CULTURE: NO GROWTH
Colony Count: NO GROWTH

## 2013-11-30 LAB — BLOOD GAS, ARTERIAL
ACID-BASE DEFICIT: 5.6 mmol/L — AB (ref 0.0–2.0)
Bicarbonate: 21.3 mEq/L (ref 20.0–24.0)
Drawn by: 31288
FIO2: 0.7 %
O2 SAT: 95.2 %
PEEP: 14 cmH2O
Patient temperature: 99.5
RATE: 25 resp/min
TCO2: 23 mmol/L (ref 0–100)
pCO2 arterial: 58.8 mmHg (ref 35.0–45.0)
pH, Arterial: 7.188 — CL (ref 7.350–7.450)
pO2, Arterial: 94.5 mmHg (ref 80.0–100.0)

## 2013-11-30 LAB — STREP PNEUMONIAE URINARY ANTIGEN: STREP PNEUMO URINARY ANTIGEN: NEGATIVE

## 2013-11-30 LAB — PRO B NATRIURETIC PEPTIDE: Pro B Natriuretic peptide (BNP): 2535 pg/mL — ABNORMAL HIGH (ref 0–450)

## 2013-11-30 LAB — PROCALCITONIN: Procalcitonin: 5.37 ng/mL

## 2013-11-30 LAB — PHOSPHORUS: Phosphorus: 2.5 mg/dL (ref 2.3–4.6)

## 2013-11-30 LAB — TROPONIN I: Troponin I: <0.3 ng/mL (ref <0.30)

## 2013-11-30 LAB — MAGNESIUM: Magnesium: 2.1 mg/dL (ref 1.5–2.5)

## 2013-11-30 MED ORDER — HYDROCORTISONE NA SUCCINATE PF 100 MG IJ SOLR
50.0000 mg | Freq: Four times a day (QID) | INTRAMUSCULAR | Status: DC
  Administered 2013-11-30 – 2013-12-01 (×2): 50 mg via INTRAVENOUS
  Filled 2013-11-30 (×6): qty 1

## 2013-11-30 MED ORDER — SODIUM POLYSTYRENE SULFONATE 15 GM/60ML PO SUSP
30.0000 g | Freq: Once | ORAL | Status: DC
  Filled 2013-11-30: qty 120

## 2013-11-30 MED ORDER — PRO-STAT SUGAR FREE PO LIQD
60.0000 mL | Freq: Four times a day (QID) | ORAL | Status: DC
  Administered 2013-11-30 – 2013-12-05 (×19): 60 mL
  Filled 2013-11-30 (×23): qty 60

## 2013-11-30 MED ORDER — LEVOFLOXACIN IN D5W 750 MG/150ML IV SOLN
750.0000 mg | INTRAVENOUS | Status: DC
  Administered 2013-11-30: 750 mg via INTRAVENOUS
  Filled 2013-11-30 (×2): qty 150

## 2013-11-30 MED ORDER — VITAL HIGH PROTEIN PO LIQD
1000.0000 mL | ORAL | Status: DC
  Filled 2013-11-30 (×2): qty 1000

## 2013-11-30 MED ORDER — PRO-STAT SUGAR FREE PO LIQD
30.0000 mL | Freq: Two times a day (BID) | ORAL | Status: DC
  Filled 2013-11-30 (×2): qty 30

## 2013-11-30 MED ORDER — METHYLPREDNISOLONE SODIUM SUCC 125 MG IJ SOLR
60.0000 mg | Freq: Four times a day (QID) | INTRAMUSCULAR | Status: DC
  Administered 2013-11-30 (×2): 60 mg via INTRAVENOUS
  Filled 2013-11-30 (×5): qty 0.96

## 2013-11-30 MED ORDER — OXEPA PO LIQD
1000.0000 mL | ORAL | Status: DC
  Administered 2013-12-01 – 2013-12-04 (×4): 1000 mL
  Filled 2013-11-30 (×7): qty 1000

## 2013-11-30 MED ORDER — HYDROCORTISONE NA SUCCINATE PF 100 MG IJ SOLR
50.0000 mg | Freq: Four times a day (QID) | INTRAMUSCULAR | Status: DC
  Administered 2013-11-30: 50 mg via INTRAVENOUS
  Filled 2013-11-30 (×4): qty 1

## 2013-11-30 MED ORDER — ASPIRIN 81 MG PO CHEW
81.0000 mg | CHEWABLE_TABLET | Freq: Every day | ORAL | Status: DC
  Administered 2013-12-01 – 2013-12-08 (×8): 81 mg
  Filled 2013-11-30 (×8): qty 1

## 2013-11-30 MED ORDER — SODIUM BICARBONATE 8.4 % IV SOLN
INTRAVENOUS | Status: DC
  Administered 2013-11-30: 17:00:00 via INTRAVENOUS
  Filled 2013-11-30 (×3): qty 150

## 2013-11-30 NOTE — Progress Notes (Signed)
PT Discharge Note  Patient Details Name: Ronald Frank MRN: [id number] DOB: 01/18/1935  Patient is being discharged from PT services secondary to: Medical decline- will need to re-order to resume therapy services. Will need new PT order to resume therapy when appropriate.   Please see latest Therapy Progress Note for current level of functioning and progress toward goals.   Progress and discharge plan and discussed with patient/caregiver and they: No caregivers available Patient unable to participate in discharge planning    [name] 11/30/2013, 7:43 AM Pager [phone]

## 2013-11-30 NOTE — Progress Notes (Signed)
Changes made per MD post ABG

## 2013-11-30 NOTE — Progress Notes (Signed)
Called to eval pt at bedside based on abg results: ABG    Component Value Date/Time   PHART 7.224* 11/30/2013 0137   PCO2ART 49.1* 11/30/2013 0137   PO2ART 88.0 11/30/2013 0137   HCO3 20.2 11/30/2013 0137   TCO2 22 11/30/2013 0137   ACIDBASEDEF 7.0* 11/30/2013 0137   O2SAT 94.0 11/30/2013 0137   He had marked air trapping on ARDS net settings. Have placed him back on PCV w/ following settings PC 16/peep 14/rr 25 Plan Will f/u abg, hope we can wean fio2/peep over next few hours.   Anders SimmondsPete Porschea Borys ACNP-BC [facility] Pager # [phone] OR # [phone] if no answer

## 2013-11-30 NOTE — Progress Notes (Signed)
INITIAL NUTRITION ASSESSMENT  DOCUMENTATION CODES Per approved criteria  -Obesity Unspecified   INTERVENTION:  Utilize 34M PEPuP Protocol: initiate TF via NGT with Oxepa at 25 ml/h and Prostat 60 ml QID on day 1; on day 2, increase to goal rate of 30 ml/h (720 ml per day) to provide 1880 kcals (25 kcals/kg ideal weight), 165 gm protein, 565 ml free water daily.  NUTRITION DIAGNOSIS: Inadequate oral intake related to inability to eat as evidenced by NPO status.   Goal: Enteral nutrition to provide 60-70% of estimated calorie needs (22-25 kcals/kg ideal body weight) and 100% of estimated protein needs, based on ASPEN guidelines for permissive underfeeding in critically ill obese individuals.  Monitor:  TF tolerance/adequacy, weight trend, labs, vent status.  Reason for Assessment: MD Consult for TF initiation and management.  79 y.o. male  Admitting Dx: Contusion of left hip  ASSESSMENT: Patient is a 79 y.o. M admitted on 3/16 for fall, suffered L hip contusion with no fx or prosthesis displacment noted on CT of hip. Developed acute onset tachypnea, tachycardia, hypoxia 3/17. Transferred to MICU and intubated on 3/17.   Patient's son reports that patient had no nutrition issues PTA; he was eating well and weight was stable. Nutrition focused physical exam completed.  No muscle or subcutaneous fat depletion noticed.  Patient is currently intubated on ventilator support. ARDS protocol in place. MV: 16.7 L/min Temp (24hrs), Avg:99.4 F (37.4 C), Min:98.3 F (36.8 C), Max:101 F (38.3 C)   Height: Ht Readings from Last 1 Encounters:  11/29/13 5\' 10"  (1.778 m)    Weight: Wt Readings from Last 1 Encounters:  11/30/13 218 lb 14.7 oz (99.3 kg)    Ideal Body Weight: 75.5 kg  % Ideal Body Weight: 132%  Wt Readings from Last 10 Encounters:  11/30/13 218 lb 14.7 oz (99.3 kg)  08/09/13 224 lb (101.606 kg)  05/31/13 223 lb (101.152 kg)  04/05/13 223 lb 12.8 oz (101.515 kg)   01/07/13 219 lb (99.338 kg)  10/04/12 219 lb (99.338 kg)  08/10/12 212 lb (96.163 kg)  07/24/12 225 lb (102.059 kg)  07/24/12 225 lb (102.059 kg)  04/19/12 228 lb 3.2 oz (103.511 kg)    Usual Body Weight: 219-224 lb  % Usual Body Weight: 99%  BMI:  Body mass index is 31.41 kg/(m^2).  Estimated Nutritional Needs: Kcal: 2467 Protein: 151 gm Fluid: 2.4 L  Skin: no wounds  Diet Order:  NPO  EDUCATION NEEDS: -Education not appropriate at this time   Intake/Output Summary (Last 24 hours) at 11/30/13 1426 Last data filed at 11/30/13 1300  Gross per 24 hour  Intake 1547.1 ml  Output    420 ml  Net 1127.1 ml    Last BM: 3/15   Labs:   Recent Labs Lab 11/28/13 1635 11/29/13 0755 11/30/13 0154  NA 136* 139 139  K 4.2 4.1 5.8*  CL 96 100 104  CO2 22 19 19   BUN 34* 26* 25*  CREATININE 1.96* 1.23 1.48*  CALCIUM 8.7 8.5 7.8*  GLUCOSE 130* 135* 198*    CBG (last 3)   Recent Labs  11/30/13 0341 11/30/13 0742 11/30/13 1210  GLUCAP 192* 249* 203*    Scheduled Meds: . albuterol  2.5 mg Inhalation Q4H  . antiseptic oral rinse  15 mL Mouth Rinse QID  . artificial tears  1 application Both Eyes 3 times per day  . aspirin  81 mg Per Tube Daily  . chlorhexidine  15 mL Mouth Rinse BID  .  clopidogrel  75 mg Oral Q breakfast  . ezetimibe  10 mg Oral Daily  . feeding supplement (PRO-STAT SUGAR FREE 64)  30 mL Per Tube BID  . feeding supplement (VITAL HIGH PROTEIN)  1,000 mL Per Tube Q24H  . heparin  4,000 Units Intravenous Once  . hydrocortisone sodium succinate  50 mg Intravenous Q6H  . insulin aspart  0-15 Units Subcutaneous 6 times per day  . levofloxacin (LEVAQUIN) IV  750 mg Intravenous Q48H  . pantoprazole (PROTONIX) IV  40 mg Intravenous Q24H  . piperacillin-tazobactam (ZOSYN)  IV  3.375 g Intravenous Q8H  . rosuvastatin  20 mg Oral q1800  . sodium polystyrene  30 g Per Tube Once  . vancomycin  750 mg Intravenous Q12H    Continuous Infusions: .  sodium chloride 20 mL/hr at 11/30/13 0700  . cisatracurium (NIMBEX) infusion 3 mcg/kg/min (11/30/13 1200)  . fentaNYL infusion INTRAVENOUS 200 mcg/hr (11/30/13 0733)  . heparin 1,600 Units/hr (11/30/13 0905)  . midazolam (VERSED) infusion 4 mg/hr (11/30/13 0830)  . norepinephrine (LEVOPHED) Adult infusion 2 mcg/min (11/30/13 1300)  .  sodium bicarbonate  infusion 1000 mL      Past Medical History  Diagnosis Date  . COPD (chronic obstructive pulmonary disease)   . Hyperlipidemia   . Myocardial infarction 2003  . Arthritis   . Joint pain   . Bronchitis   . Productive cough   . Anxiety   . Diabetes mellitus without complication   . Stroke   . GERD (gastroesophageal reflux disease)     Past Surgical History  Procedure Laterality Date  . Total hip arthroplasty  2005  . Coronary artery bypass graft  2003  . Femoral artery - femoral artery bypass graft  2003    right to left  . Lumbar disc surgery      x2 1999 & 2000  . Aortagram  01/20/2012    Abdominal Aortagram  . Rhae Hammock without cardioversion  07/27/2012    Procedure: TRANSESOPHAGEAL ECHOCARDIOGRAM (TEE);  Surgeon: Lewayne Bunting, MD;  Location: [facility] ENDOSCOPY;  Service: Cardiovascular;  Laterality: N/A;    Joaquin Courts, RD, LDN, CNSC Pager [phone] After Hours Pager [phone]

## 2013-11-30 NOTE — Clinical Documentation Improvement (Signed)
Medicare rules require specification as to whether an inpatient diagnosis was present at the time of admission.    Please clarify if the following diagnosis "Pneumonia" was:      - Present on Admission   - Not Present on Admission and developed during the admission   - Unable to Clinically Determine  Clinical Indicators:   - Dehydrated on admission with AKI per H&P   - WBC 11.2 on admission increased to 19.7 in less than 24 hours   - Change in CXR now showing left mid and lower lung pneumonia   Thank You, Jerral Ralphathy R Jaeceon Michelin ,RN BSN CCDS Certified Clinical Documentation Specialist:  [phone] [facility] Health Information Management

## 2013-11-30 NOTE — Progress Notes (Signed)
ANTICOAGULATION CONSULT NOTE - Follow Up Consult  Pharmacy Consult for Heparin Indication: pulmonary embolus  Allergies  Allergen Reactions  . Actos [Pioglitazone Hydrochloride] Other (See Comments)    unkknown  . Erythromycin Hives and Itching  . Escitalopram Oxalate Hives and Itching  . Lipitor [Atorvastatin Calcium] Other (See Comments)    Weakness   . Omnipaque [Iohexol] Other (See Comments)    unknown  . Pletaal [Cilostazol] Other (See Comments)    unknown    Patient Measurements: Height: 5\' 10"  (177.8 cm) Weight: 218 lb 14.7 oz (99.3 kg) IBW/kg (Calculated) : 73 Heparin Dosing Weight: 93.7 kg  Vital Signs: Temp: 101 F (38.3 C) (03/18 1305) Temp src: Oral (03/18 1305) BP: 102/45 mmHg (03/18 1300) Pulse Rate: 105 (03/18 1330)  Labs:  Recent Labs  11/28/13 1635 11/29/13 0755 11/29/13 1331 11/29/13 1348 11/29/13 1955 11/30/13 0154 11/30/13 0420 11/30/13 0855  HGB 11.8* 11.9*  --   --   --  11.5*  --   --   HCT 34.7* 34.7*  --   --   --  34.5*  --   --   PLT 209 206  --   --   --  228  --   --   APTT  --   --   --   --  56*  --   --   --   LABPROT  --   --   --   --  15.9*  --   --   --   INR  --   --   --   --  1.30  --   --   --   HEPARINUNFRC  --   --   --   --   --  0.56  --  0.81*  CREATININE 1.96* 1.23  --   --   --  1.48*  --   --   CKTOTAL  --   --   --  247*  --   --   --   --   TROPONINI  --   --  <0.30  --  0.39*  --  <0.30  --     Estimated Creatinine Clearance: 49.4 ml/min (by C-G formula based on Cr of 1.48).   Medications:  Heparin 1600 units/hr  Assessment: 79 YOM on IV heparin for r/o DVT/PE. HL 0.81- slightly SUPRA-therapeutic. No bleeding noted. CBC stable.   Goal of Therapy:  Heparin level 0.3-0.7 units/ml Monitor platelets by anticoagulation protocol: Yes   Plan:  Decrease heparin to 1400 units/hr.  Heparin level in 8 hours.   Link SnufferJessica Rodolph Hagemann, PharmD, BCPS Clinical Pharmacist [phone] 11/30/2013,2:01 PM

## 2013-11-30 NOTE — Progress Notes (Signed)
ANTICOAGULATION CONSULT NOTE - Follow Up Consult  Pharmacy Consult for heparin Indication: r/o PE/DVT  Labs:  Recent Labs  11/28/13 1635 11/29/13 0755 11/29/13 1331 11/29/13 1348 11/29/13 1955 11/30/13 0154  HGB 11.8* 11.9*  --   --   --  11.5*  HCT 34.7* 34.7*  --   --   --  34.5*  PLT 209 206  --   --   --  228  APTT  --   --   --   --  56*  --   LABPROT  --   --   --   --  15.9*  --   INR  --   --   --   --  1.30  --   HEPARINUNFRC  --   --   --   --   --  0.56  CREATININE 1.96* 1.23  --   --   --  1.48*  CKTOTAL  --   --   --  247*  --   --   TROPONINI  --   --  <0.30  --  0.39*  --     Assessment/Plan:  79yo male therapeutic on heparin with initial dosing for r/o DVT, now also concern for PE. Will continue gtt at current rate and confirm stable with additional level.   Vernard GamblesVeronda Avon Molock, PharmD, BCPS  11/30/2013,2:51 AM

## 2013-11-30 NOTE — Progress Notes (Signed)
Prior to Abd xray and CXR pt compliant with vent, TOF twitch 1/4 on 40 map with a nimbex drip 3 mcg/kg/min.  After xrays pt triggering breaths on vent and breathing over vent by 5-10 bpm.  Anders SimmondsPete Babcock NP at bedside with RT.  Dispite TOF results Nimbex increased to achieve vent compliance.  Bolus of nimbex given.  Pete disconnected the vent circuit to check pt for spontaneous respirations (none note).  Reconnected circuit and no pt vent triggers noted.  Will keep Nimbex at current rate to keep pt compliant with vent setting, Theron AristaPeter NP and RT agree.

## 2013-11-30 NOTE — Consult Note (Signed)
PULMONARY / CRITICAL CARE MEDICINE   Name: Ronald Frank MRN: [id number] DOB: May 02, 1935    ADMISSION DATE:  11/28/2013 CONSULTATION DATE:  11/29/2013  REFERRING MD :  Eliseo Squires PRIMARY SERVICE: PCCM  CHIEF COMPLAINT:  Resp Distress  BRIEF PATIENT DESCRIPTION: 79 y.o. M admitted 3/16 for fall, suffered L hip contusion with no fx or prosthesis displacment noted on CT of hip.  Developed acute onset tachypnea, tachycardia, hypoxia 3/17.  RRT paged and PCCM called for transfer to ICU and ETT placed.  SIGNIFICANT EVENTS / STUDIES:  3/16 - admitted after mechanical fall at home. 3/17 - developed respiratory distress with RR 44, HR 130, SpO2 91%.  3/17 echo -55%, rt heart slight reduction  LINES / TUBES: ETT 3/17 >>  L [place]da CVL 3/17 >>> A line rt radial 3/17>>>  CULTURES: Blood 3/17 >>  Urine 3/17 >>  Resp 3/17 >>   ANTIBIOTICS: Ceftriaxone 3/17 x 1 Vanc 3/17 >>> Zosyn 3/17 >>> levofloxacin (bilat process, atypical?)3/18>>>  SUBJECTIVE:  K was high, on low dose levophed, infiltrates noted, ards was started  VITAL SIGNS: Temp:  [98.3 F (36.8 C)-101 F (38.3 C)] 101 F (38.3 C) (03/18 1305) Pulse Rate:  [97-144] 105 (03/18 1330) Resp:  [12-48] 32 (03/18 1330) BP: (59-196)/(21-132) 102/45 mmHg (03/18 1300) SpO2:  [73 %-99 %] 98 % (03/18 1330) Arterial Line BP: (84-212)/(36-68) 136/43 mmHg (03/18 1330) FiO2 (%):  [50 %-100 %] 70 % (03/18 1214) Weight:  [99 kg (218 lb 4.1 oz)-99.3 kg (218 lb 14.7 oz)] 99.3 kg (218 lb 14.7 oz) (03/18 0426) HEMODYNAMICS: CVP:  [9 mmHg-13 mmHg] 9 mmHg VENTILATOR SETTINGS: Vent Mode:  [-] PRVC FiO2 (%):  [50 %-100 %] 70 % Set Rate:  [16 bmp-32 bmp] 32 bmp Vt Set:  [500 mL] 500 mL PEEP:  [5 cmH20-14 cmH20] 12 cmH20 Plateau Pressure:  [15 cmH20-25 cmH20] 23 cmH20 INTAKE / OUTPUT: Intake/Output     03/17 0701 - 03/18 0700 03/18 0701 - 03/19 0700   P.O. 120    I.V. (mL/kg) 849.7 (8.6) 547.4 (5.5)   IV Piggyback 150    Total Intake(mL/kg) 1119.7  (11.3) 547.4 (5.5)   Urine (mL/kg/hr) 285 (0.1) 160 (0.2)   Total Output 285 160   Net +834.7 +387.4        Urine Occurrence 2 x      PHYSICAL EXAMINATION: General: Extremely agitated, rass -5 Neuro: paralyxed HEENT: [place]/AT. PERRL, line clean Cardiovascular:  s1 s2 RRT no m Lungs: coarse Abdomen: BS x 4, soft, NT/ND.  Ext: Warm without edema, L hip exquisitely painful to touch or movement Skin: no rashes.    LABS:  CBC  Recent Labs Lab 11/28/13 1635 11/29/13 0755 11/30/13 0154  WBC 11.2* 19.7* 27.0*  HGB 11.8* 11.9* 11.5*  HCT 34.7* 34.7* 34.5*  PLT 209 206 228   Coag's  Recent Labs Lab 11/29/13 1955  APTT 56*  INR 1.30   BMET  Recent Labs Lab 11/28/13 1635 11/29/13 0755 11/30/13 0154  NA 136* 139 139  K 4.2 4.1 5.8*  CL 96 100 104  CO2 _0 BUN 34* 26* 25*  CREATININE 1.96* 1.23 1.48*  GLUCOSE 130* 135* 198*   Electrolytes  Recent Labs Lab 11/28/13 1635 11/29/13 0755 11/30/13 0154  CALCIUM 8.7 8.5 7.8*   Sepsis Markers  Recent Labs Lab 11/29/13 1348 11/29/13 1955 11/30/13 0420  LATICACIDVEN 3.2*  --   --   PROCALCITON  --  1.69 5.37   ABG  Recent Labs Lab 11/30/13 0137 11/30/13 0415 11/30/13 0641  PHART 7.224* 7.188* 7.205*  PCO2ART 49.1* 58.8* 48.4*  PO2ART 88.0 94.5 88.0   Liver Enzymes  Recent Labs Lab 11/30/13 0154  AST 21  ALT 20  ALKPHOS 54  BILITOT 0.3  ALBUMIN 2.7*   Cardiac Enzymes  Recent Labs Lab 11/29/13 1331 11/29/13 1955 11/30/13 0420  TROPONINI <0.30 0.39* <0.30  PROBNP  --  2322.0* 2535.0*   Glucose  Recent Labs Lab 11/29/13 1619 11/29/13 1930 11/29/13 2322 11/30/13 0341 11/30/13 0742 11/30/13 1210  GLUCAP 145* 171* 132* 192* 249* 203*    CXR: bilat int infiltrates, ett wnl, line wnl  ASSESSMENT / PLAN:  PULMONARY A: Acute Respiratory Failure of sudden onset - etiology favor PNA, ARDS Possible HAP Concern for PE given sudden onset and clinical/radiographic  disparity P:   Intubation performed LE venous Doppler studies - pending Consider CTA 3/18, however infiltrates explain concerns thus far Scheduled BDs ordered, dc if not home med with ards ARDs protocol, maintain, keep plat 25-30 maintain paralysis x 48 hr Repeat abg , reviewed, may need some permissive hypercapnia with copd and rate reduction, once K corrected Peep / fio2 per ARDS, likley can reduce as PaO2 90 pcxr in am  May need prone position, assess peep 10, 60% as threshold  CARDIOVASCULAR A: Chronic RBBB H/O CAD H/O HTN Septic shock likely P: R/O MI with serial biomarkers Echocardiogram and cvp and  Ef not c/w cardiogenic shock Dc BB Continue Crestor, ASA, Clopidogrel.  cvp goal met  RENAL A:  Acute renal failure, mild hyperkalemia P:   Repeat K now Add bicarb drip Keep ph greater 7.30 kaxylate  GASTROINTESTINAL A:   NPO, ARDS P:   SUP: IV pantoprazole Consider TFs oxepa lft in am   HEMATOLOGIC A:   Low clinical suspicion PE Concern for VTE P:  DVT px: full dose UFH, dc to sub q hep if doppler legs neg Avoid contrast, allergy and ARF  INFECTIOUS A: septic shock PNA, asp>? P:   Pct algo to reduce abx exposure ok for now but more convinced will need 8 days abx Continue vanc, zosyn Add atypical levo Send urine leg , strep  ENDOCRINE A:  DM 2 R/o rel AI P:   SSI protocol ordered Cortisol unable om steroids already  NEUROLOGIC A:  Profound agitated delirium/acute encephalopathy Vent dyssynchrony Severe L hip pain - out of proportion to CT findings   ? Missed fx or prosthesis displacement ARDS P:   Cisatracurium, fent, versed Consider re-imaging of L hip ehan stable for this, vent an issue now Daily WUA unable  I have personally obtained a history, examined the patient, evaluated laboratory and imaging results, formulated the assessment and plan and placed orders. CRITICAL CARE: The patient is critically ill with multiple organ  systems failure and requires high complexity decision making for assessment and support, frequent evaluation and titration of therapies, application of advanced monitoring technologies and extensive interpretation of multiple databases. Critical Care Time devoted to patient care services described in this note is 45 minutes.   Lavon Paganini. Titus Mould, MD, [place] Pgr: [facility]

## 2013-11-30 NOTE — Progress Notes (Signed)
Changes made per MD post ABG 

## 2013-11-30 NOTE — Progress Notes (Addendum)
Called and spoke with Dr. Vassie LollAlva. Repeat K+ 4.9 from 5.8. rec'd order to hold Kayexalate. K+ 4.2 Onuchukwu, Carolyne Whitsel M

## 2013-11-30 NOTE — Progress Notes (Signed)
ANTICOAGULATION CONSULT NOTE - Follow Up Consult  Pharmacy Consult for Heparin Indication: pulmonary embolus  Allergies  Allergen Reactions  . Actos [Pioglitazone Hydrochloride] Other (See Comments)    unkknown  . Erythromycin Hives and Itching  . Escitalopram Oxalate Hives and Itching  . Lipitor [Atorvastatin Calcium] Other (See Comments)    Weakness   . Omnipaque [Iohexol] Other (See Comments)    unknown  . Pletaal [Cilostazol] Other (See Comments)    unknown   Patient Measurements: Height: 5\' 10"  (177.8 cm) Weight: 218 lb 14.7 oz (99.3 kg) IBW/kg (Calculated) : 73 Heparin Dosing Weight: 93.7 kg  Vital Signs: Temp: 100.2 F (37.9 C) (03/18 1955) Temp src: Oral (03/18 1955) BP: 128/53 mmHg (03/18 2000) Pulse Rate: 114 (03/18 2000)  Labs:  Recent Labs  11/28/13 1635 11/29/13 0755 11/29/13 1331 11/29/13 1348 11/29/13 1955 11/30/13 0154 11/30/13 0420 11/30/13 0855 11/30/13 1350 11/30/13 1418 11/30/13 2008 11/30/13 2151  HGB 11.8* 11.9*  --   --   --  11.5*  --   --   --   --   --   --   HCT 34.7* 34.7*  --   --   --  34.5*  --   --   --   --   --   --   PLT 209 206  --   --   --  228  --   --   --   --   --   --   APTT  --   --   --   --  56*  --   --   --   --   --   --   --   LABPROT  --   --   --   --  15.9*  --   --   --   --   --   --   --   INR  --   --   --   --  1.30  --   --   --   --   --   --   --   HEPARINUNFRC  --   --   --   --   --  0.56  --  0.81*  --   --   --  0.67  CREATININE 1.96* 1.23  --   --   --  1.48*  --   --  1.69* 1.81* 1.85*  --   CKTOTAL  --   --   --  247*  --   --   --   --   --   --   --   --   TROPONINI  --   --  <0.30  --  0.39*  --  <0.30  --   --   --   --   --    Estimated Creatinine Clearance: 39.5 ml/min (by C-G formula based on Cr of 1.85).  Medications:  Heparin 1600 units/hr  Assessment: 79 YOM on IV heparin for r/o DVT/PE. HL 0.67 and now within desired goal range.  No bleeding noted. CBC stable. Plans to  discontinue if dopplers negative.  Will f/u in AM for results.  Goal of Therapy:  Heparin level 0.3-0.7 units/ml Monitor platelets by anticoagulation protocol: Yes   Plan:  Continue heparin at 1400 units/hr.  Heparin level in AM CBC daily   Nadara Mustard, PharmD., MS Clinical Pharmacist Pager:  [phone] Thank you for allowing pharmacy to be part of  this patients care team. 11/30/2013,10:22 PM

## 2013-11-30 NOTE — Progress Notes (Signed)
Changes made post ABG per ARDS protocol

## 2013-12-01 ENCOUNTER — Inpatient Hospital Stay (HOSPITAL_COMMUNITY): Payer: Medicare Other

## 2013-12-01 DIAGNOSIS — R069 Unspecified abnormalities of breathing: Secondary | ICD-10-CM

## 2013-12-01 LAB — LEGIONELLA ANTIGEN, URINE: LEGIONELLA ANTIGEN, URINE: NEGATIVE

## 2013-12-01 LAB — COMPREHENSIVE METABOLIC PANEL
ALT: 37 U/L (ref 0–53)
AST: 49 U/L — ABNORMAL HIGH (ref 0–37)
Albumin: 2 g/dL — ABNORMAL LOW (ref 3.5–5.2)
Alkaline Phosphatase: 47 U/L (ref 39–117)
BUN: 50 mg/dL — AB (ref 6–23)
CO2: 23 mEq/L (ref 19–32)
Calcium: 7.7 mg/dL — ABNORMAL LOW (ref 8.4–10.5)
Chloride: 101 mEq/L (ref 96–112)
Creatinine, Ser: 1.95 mg/dL — ABNORMAL HIGH (ref 0.50–1.35)
GFR calc non Af Amer: 31 mL/min — ABNORMAL LOW (ref >90)
GFR, EST AFRICAN AMERICAN: 36 mL/min — AB (ref >90)
GLUCOSE: 232 mg/dL — AB (ref 70–99)
Potassium: 3.9 mEq/L (ref 3.7–5.3)
Sodium: 139 mEq/L (ref 137–147)
Total Bilirubin: <0.2 mg/dL — ABNORMAL LOW (ref 0.3–1.2)
Total Protein: 5.3 g/dL — ABNORMAL LOW (ref 6.0–8.3)

## 2013-12-01 LAB — GLUCOSE, CAPILLARY
GLUCOSE-CAPILLARY: 152 mg/dL — AB (ref 70–99)
Glucose-Capillary: 184 mg/dL — ABNORMAL HIGH (ref 70–99)
Glucose-Capillary: 192 mg/dL — ABNORMAL HIGH (ref 70–99)
Glucose-Capillary: 236 mg/dL — ABNORMAL HIGH (ref 70–99)
Glucose-Capillary: 199 mg/dL — ABNORMAL HIGH (ref 70–99)
Glucose-Capillary: 143 mg/dL — ABNORMAL HIGH (ref 70–99)

## 2013-12-01 LAB — HEPARIN LEVEL (UNFRACTIONATED): Heparin Unfractionated: 0.52 IU/mL (ref 0.30–0.70)

## 2013-12-01 LAB — CBC WITH DIFFERENTIAL/PLATELET
Basophils Absolute: 0 10*3/uL (ref 0.0–0.1)
Basophils Relative: 0 % (ref 0–1)
Eosinophils Absolute: 0 10*3/uL (ref 0.0–0.7)
Eosinophils Relative: 0 % (ref 0–5)
HEMATOCRIT: 27.6 % — AB (ref 39.0–52.0)
Hemoglobin: 9 g/dL — ABNORMAL LOW (ref 13.0–17.0)
LYMPHS ABS: 1 10*3/uL (ref 0.7–4.0)
Lymphocytes Relative: 5 % — ABNORMAL LOW (ref 12–46)
MCH: 33.3 pg (ref 26.0–34.0)
MCHC: 32.6 g/dL (ref 30.0–36.0)
MCV: 102.2 fL — ABNORMAL HIGH (ref 78.0–100.0)
MONO ABS: 0.9 10*3/uL (ref 0.1–1.0)
MONOS PCT: 5 % (ref 3–12)
Neutro Abs: 16.6 10*3/uL — ABNORMAL HIGH (ref 1.7–7.7)
Neutrophils Relative %: 90 % — ABNORMAL HIGH (ref 43–77)
Platelets: 179 10*3/uL (ref 150–400)
RBC: 2.7 MIL/uL — ABNORMAL LOW (ref 4.22–5.81)
RDW: 15.5 % (ref 11.5–15.5)
WBC: 18.5 10*3/uL — AB (ref 4.0–10.5)

## 2013-12-01 LAB — BLOOD GAS, ARTERIAL
Acid-base deficit: 2.3 mmol/L — ABNORMAL HIGH (ref 0.0–2.0)
Bicarbonate: 23.1 mEq/L (ref 20.0–24.0)
FIO2: 0.5 %
O2 Saturation: 95.5 %
PATIENT TEMPERATURE: 100.9
PEEP: 10 cmH2O
PH ART: 7.289 — AB (ref 7.350–7.450)
RATE: 32 resp/min
TCO2: 24.6 mmol/L (ref 0–100)
VT: 500 mL
pCO2 arterial: 50.8 mmHg — ABNORMAL HIGH (ref 35.0–45.0)
pO2, Arterial: 90.1 mmHg (ref 80.0–100.0)

## 2013-12-01 LAB — PROCALCITONIN: Procalcitonin: 4.93 ng/mL

## 2013-12-01 LAB — MAGNESIUM: Magnesium: 2 mg/dL (ref 1.5–2.5)

## 2013-12-01 LAB — PHOSPHORUS: PHOSPHORUS: 2.7 mg/dL (ref 2.3–4.6)

## 2013-12-01 MED ORDER — INFLUENZA VAC SPLIT QUAD 0.5 ML IM SUSP
0.5000 mL | INTRAMUSCULAR | Status: DC
  Filled 2013-12-01: qty 0.5

## 2013-12-01 MED ORDER — METOPROLOL TARTRATE 1 MG/ML IV SOLN
2.5000 mg | Freq: Four times a day (QID) | INTRAVENOUS | Status: DC | PRN
  Administered 2013-12-01 – 2013-12-02 (×2): 2.5 mg via INTRAVENOUS
  Administered 2013-12-03 (×2): 5 mg via INTRAVENOUS
  Administered 2013-12-03 – 2013-12-04 (×3): 2.5 mg via INTRAVENOUS
  Filled 2013-12-01 (×7): qty 5

## 2013-12-01 MED ORDER — HYDROCORTISONE NA SUCCINATE PF 100 MG IJ SOLR
25.0000 mg | Freq: Four times a day (QID) | INTRAMUSCULAR | Status: DC
Start: 2013-12-01 — End: 2013-12-02
  Administered 2013-12-01 – 2013-12-02 (×4): 25 mg via INTRAVENOUS
  Filled 2013-12-01 (×8): qty 0.5

## 2013-12-01 MED ORDER — PNEUMOCOCCAL VAC POLYVALENT 25 MCG/0.5ML IJ INJ
0.5000 mL | INJECTION | INTRAMUSCULAR | Status: DC
  Filled 2013-12-01: qty 0.5

## 2013-12-01 MED ORDER — HEPARIN SODIUM (PORCINE) 5000 UNIT/ML IJ SOLN
5000.0000 [IU] | Freq: Three times a day (TID) | INTRAMUSCULAR | Status: DC
  Administered 2013-12-01 – 2013-12-06 (×15): 5000 [IU] via SUBCUTANEOUS
  Filled 2013-12-01 (×18): qty 1

## 2013-12-01 MED ORDER — METOPROLOL TARTRATE 1 MG/ML IV SOLN
INTRAVENOUS | Status: AC
  Filled 2013-12-01: qty 5

## 2013-12-01 MED ORDER — INSULIN GLARGINE 100 UNIT/ML ~~LOC~~ SOLN
10.0000 [IU] | Freq: Every day | SUBCUTANEOUS | Status: DC
  Administered 2013-12-01 – 2013-12-03 (×3): 10 [IU] via SUBCUTANEOUS
  Filled 2013-12-01 (×4): qty 0.1

## 2013-12-01 MED ORDER — VANCOMYCIN HCL 10 G IV SOLR
1250.0000 mg | INTRAVENOUS | Status: DC
  Administered 2013-12-02 – 2013-12-03 (×2): 1250 mg via INTRAVENOUS
  Filled 2013-12-01 (×2): qty 1250

## 2013-12-01 MED ORDER — ACETAMINOPHEN 160 MG/5ML PO SOLN
650.0000 mg | ORAL | Status: DC | PRN
  Administered 2013-12-01 – 2013-12-03 (×5): 650 mg
  Filled 2013-12-01 (×5): qty 20.3

## 2013-12-01 MED ORDER — ACETAMINOPHEN 160 MG/5ML PO SOLN
650.0000 mg | Freq: Four times a day (QID) | ORAL | Status: DC | PRN
  Administered 2013-12-01: 650 mg
  Filled 2013-12-01 (×2): qty 20.3

## 2013-12-01 NOTE — Progress Notes (Signed)
Spoke with NP concerning hr increasing to 140 and fever 102.8 despite administration of prescribed tylenol. Orders received for PRN metoprolol.

## 2013-12-01 NOTE — Progress Notes (Signed)
VASCULAR LAB PRELIMINARY  PRELIMINARY  PRELIMINARY  PRELIMINARY  Bilateral lower extremity venous duplex  completed.    Preliminary report:  Bilateral:  No evidence of DVT, superficial thrombosis, or Baker's Cyst.    Ronald Frank, RVT 12/01/2013, 10:19 AM

## 2013-12-01 NOTE — Progress Notes (Signed)
ANTIBIOTIC CONSULT NOTE - FOLLOW UP  Pharmacy Consult for vancomycin, Zosyn Indication: rule out pneumonia  Allergies  Allergen Reactions  . Actos [Pioglitazone Hydrochloride] Other (See Comments)    unkknown  . Erythromycin Hives and Itching  . Escitalopram Oxalate Hives and Itching  . Lipitor [Atorvastatin Calcium] Other (See Comments)    Weakness   . Omnipaque [Iohexol] Other (See Comments)    unknown  . Pletaal [Cilostazol] Other (See Comments)    unknown  Patient Measurements: Height: 5\' 10"  (177.8 cm) Weight: 222 lb 7.1 oz (100.9 kg) IBW/kg (Calculated) : 73 Vital Signs: Temp: 101 F (38.3 C) (03/19 0852) Temp src: Oral (03/19 0852) BP: 115/42 mmHg (03/19 1000) Pulse Rate: 112 (03/19 1000) Intake/Output from previous day: 03/18 0701 - 03/19 0700 In: 3870.6 [I.V.:2797.2; NG/GT:423.3; IV Piggyback:650] Out: 757 [Urine:757] Intake/Output from this shift: Total I/O In: 158 [I.V.:108; NG/GT:50] Out: 125 [Urine:125]  Labs:  Recent Labs  11/29/13 0755 11/30/13 0154  11/30/13 1418 11/30/13 2008 12/01/13 0420  WBC 19.7* 27.0*  --   --   --  18.5*  HGB 11.9* 11.5*  --   --   --  9.0*  PLT 206 228  --   --   --  179  CREATININE 1.23 1.48*  < > 1.81* 1.85* 1.95*  < > = values in this interval not displayed. Estimated Creatinine Clearance: 37.8 ml/min (by C-G formula based on Cr of 1.95).  Microbiology: Recent Results (from the past 720 hour(s))  URINE CULTURE     Status: None   Collection Time    11/29/13  1:13 PM      Result Value Ref Range Status   Specimen Description URINE, RANDOM   Final   Special Requests NONE   Final   Culture  Setup Time     Final   Value: 11/29/2013 17:04     Performed at [facility] Count     Final   Value: NO GROWTH     Performed at [facility]   Culture     Final   Value: NO GROWTH     Performed at [facility]   Report Status 11/30/2013 FINAL   Final  MRSA PCR SCREENING     Status: None    Collection Time    11/29/13  7:10 PM      Result Value Ref Range Status   MRSA by PCR NEGATIVE  NEGATIVE Final   Comment:            The GeneXpert MRSA Assay (FDA     approved for NASAL specimens     only), is one component of a     comprehensive MRSA colonization     surveillance program. It is not     intended to diagnose MRSA     infection nor to guide or     monitor treatment for     MRSA infections.  CULTURE, BLOOD (ROUTINE X 2)     Status: None   Collection Time    11/29/13  9:55 PM      Result Value Ref Range Status   Specimen Description BLOOD RIGHT HAND   Final   Special Requests     Final   Value: BOTTLES DRAWN AEROBIC AND ANAEROBIC 5CC AER,3CC ANA   Culture  Setup Time     Final   Value: 11/30/2013 01:03     Performed at [facility]  Final   Value:        BLOOD CULTURE RECEIVED NO GROWTH TO DATE CULTURE WILL BE HELD FOR 5 DAYS BEFORE ISSUING A FINAL NEGATIVE REPORT     [facility]   Report Status PENDING   Incomplete  CULTURE, RESPIRATORY (NON-EXPECTORATED)     Status: None   Collection Time    11/29/13 10:45 PM      Result Value Ref Range Status   Specimen Description TRACHEAL ASPIRATE   Final   Special Requests Normal   Final   Gram Stain     Final   Value: ABUNDANT WBC PRESENT, PREDOMINANTLY PMN     RARE SQUAMOUS EPITHELIAL CELLS PRESENT     ABUNDANT GRAM POSITIVE RODS     RARE GRAM NEGATIVE RODS     Performed at [facility]   Culture     Final   Value: Culture reincubated for better growth     [facility]formed at [facility]   Report Status PENDING   Incomplete    Anti-infectives   Start     Dose/Rate Route Frequency Ordered Stop   12/02/13 0558  vancomycin (VANCOCIN) 1,250 mg in sodium chloride 0.9 % 250 mL IVPB     1,250 mg 166.7 mL/hr over 90 Minutes Intravenous Every 24 hours 12/01/13 1204     11/30/13 1430  levofloxacin (LEVAQUIN) IVPB 750 mg     750 mg 100 mL/hr over 90 Minutes  Intravenous Every 48 hours 11/30/13 1423     11/29/13 1700  vancomycin (VANCOCIN) IVPB 750 mg/150 ml premix  Status:  Discontinued     750 mg 150 mL/hr over 60 Minutes Intravenous Every 12 hours 11/29/13 1613 12/01/13 1204   11/29/13 1700  piperacillin-tazobactam (ZOSYN) IVPB 3.375 g     3.375 g 12.5 mL/hr over 240 Minutes Intravenous Every 8 hours 11/29/13 1613     11/29/13 1100  cefTRIAXone (ROCEPHIN) 1 g in dextrose 5 % 50 mL IVPB  Status:  Discontinued     1 g 100 mL/hr over 30 Minutes Intravenous Every 24 hours 11/29/13 1050 11/29/13 1526   11/29/13 1000  levofloxacin (LEVAQUIN) tablet 500 mg  Status:  Discontinued     500 mg Oral Daily 11/29/13 0934 11/29/13 0934   11/29/13 0945  levofloxacin (LEVAQUIN) tablet 500 mg  Status:  Discontinued     500 mg Oral Every 48 hours 11/29/13 0934 11/29/13 1050      Assessment: Ronald Frank on vancomycin, Zosyn, and Levaquin for r/o CAP vs Aspiration pneumonia. WBC trending down at 18.5, Tmax 101. Patient now off pressors. Sputum culture re-incubated. SCr trending up/ current CrCl ~ 35-3540mL/min.  Antibiotic history: Vancomycin 3/17 >> Zosyn 3/17 >> Levaquin 3/18 >>  Goal of Therapy:  Vancomycin trough level 15-20 mcg/ml  Plan:  1. Decrease vancomycin to 1250mg  IV q24h -next dose 3/20 due to worsening renal function. 2. Consider vancomycin level if further worsening. 3. Continue Zosyn 3.375g IV q8h- 4hr infusion. 4. Follow-up ability to narrow antibiotic therapy.  5. Follow-up renal function and adjust as needed.   Link SnufferJessica Abrahan Fulmore, PharmD, BCPS Clinical Pharmacist [phone] 12/01/2013,12:07 PM

## 2013-12-01 NOTE — Consult Note (Signed)
PULMONARY / CRITICAL CARE MEDICINE   Name: Ronald Frank MRN: [id number] DOB: 1935/08/31    ADMISSION DATE:  11/28/2013 CONSULTATION DATE:  11/29/2013  REFERRING MD :  Benjamine Mola PRIMARY SERVICE: PCCM  CHIEF COMPLAINT:  Resp Distress  BRIEF PATIENT DESCRIPTION: 79 y.o. M admitted 3/16 for fall, suffered L hip contusion with no fx or prosthesis displacment noted on CT of hip.  Developed acute onset tachypnea, tachycardia, hypoxia 3/17.  RRT paged and PCCM called for transfer to ICU and ETT placed.  SIGNIFICANT EVENTS / STUDIES:  3/16 - admitted after mechanical fall at home. 3/17 - developed respiratory distress with RR 44, HR 130, SpO2 91%.  3/17 echo -55%, rt heart slight reduction  LINES / TUBES: ETT 3/17 >>  L [place] CVL 3/17 >>> A line rt radial 3/17>>>  CULTURES: Blood 3/17 >>  Urine 3/17 >>  Resp 3/17 >>   ANTIBIOTICS: Ceftriaxone 3/17 x 1 Vanc 3/17 >>> Zosyn 3/17 >>> levofloxacin (bilat process, atypical?)3/18>>>  SUBJECTIVE:  Improved peep , fio2  VITAL SIGNS: Temp:  [99.1 F (37.3 C)-101 F (38.3 C)] 100.9 F (38.3 C) (03/19 0420) Pulse Rate:  [99-130] 109 (03/19 0700) Resp:  [32] 32 (03/19 0700) BP: (102-133)/(38-53) 111/40 mmHg (03/19 0700) SpO2:  [93 %-99 %] 98 % (03/19 0700) Arterial Line BP: (131-200)/(35-54) 143/43 mmHg (03/19 0645) FiO2 (%):  [50 %-70 %] 50 % (03/19 0319) Weight:  [100.9 kg (222 lb 7.1 oz)] 100.9 kg (222 lb 7.1 oz) (03/19 0600) HEMODYNAMICS: CVP:  [9 mmHg-10 mmHg] 10 mmHg VENTILATOR SETTINGS: Vent Mode:  [-] PRVC FiO2 (%):  [50 %-70 %] 50 % Set Rate:  [32 bmp] 32 bmp Vt Set:  [500 mL] 500 mL PEEP:  [10 cmH20-12 cmH20] 10 cmH20 Plateau Pressure:  [20 cmH20-23 cmH20] 20 cmH20 INTAKE / OUTPUT: Intake/Output     03/18 0701 - 03/19 0700 03/19 0701 - 03/20 0700   P.O.     I.V. (mL/kg) 2650.6 (26.3)    NG/GT 398.3    IV Piggyback 650    Total Intake(mL/kg) 3699 (36.7)    Urine (mL/kg/hr) 757 (0.3)    Total Output 757     Net +2942             PHYSICAL EXAMINATION: General: Extremely agitated, rass -5, paralayzed Neuro: paralyzed HEENT: [place]/AT. PERRL, line clean Cardiovascular:  s1 s2 RRT no m Lungs: coarse unchanged Abdomen: BS x 4, soft, NT/ND.  Ext: mild edema Skin: no rashes.    LABS:  CBC  Recent Labs Lab 11/29/13 0755 11/30/13 0154 12/01/13 0420  WBC 19.7* 27.0* 18.5*  HGB 11.9* 11.5* 9.0*  HCT 34.7* 34.5* 27.6*  PLT 206 228 179   Coag's  Recent Labs Lab 11/29/13 1955  APTT 56*  INR 1.30   BMET  Recent Labs Lab 11/30/13 1418 11/30/13 2008 12/01/13 0420  NA 140 139 139  K 4.2 4.5 3.9  CL 105 104 101  CO2 21 22 23   BUN 36* 39* 50*  CREATININE 1.81* 1.85* 1.95*  GLUCOSE 206* 216* 232*   Electrolytes  Recent Labs Lab 11/30/13 1418 11/30/13 2008 12/01/13 0420  CALCIUM 7.9* 7.9* 7.7*  MG 2.1  --  2.0  PHOS 2.5  --  2.7   Sepsis Markers  Recent Labs Lab 11/29/13 1348 11/29/13 1955 11/30/13 0420 12/01/13 0420  LATICACIDVEN 3.2*  --   --   --   PROCALCITON  --  1.69 5.37 4.93   ABG  Recent Labs Lab 11/30/13  0641 11/30/13 1359 12/01/13 0430  PHART 7.205* 7.290* 7.289*  PCO2ART 48.4* 49.6* 50.8*  PO2ART 88.0 104.0* 90.1   Liver Enzymes  Recent Labs Lab 11/30/13 0154 12/01/13 0420  AST 21 49*  ALT 20 37  ALKPHOS 54 47  BILITOT 0.3 <0.2*  ALBUMIN 2.7* 2.0*   Cardiac Enzymes  Recent Labs Lab 11/29/13 1331 11/29/13 1955 11/30/13 0420  TROPONINI <0.30 0.39* <0.30  PROBNP  --  2322.0* 2535.0*   Glucose  Recent Labs Lab 11/30/13 0742 11/30/13 1210 11/30/13 1534 11/30/13 1931 11/30/13 2327 12/01/13 0338  GLUCAP 249* 203* 189* 188* 184* 192*    CXR: bilat int infiltrates, ett wnl, line wnl  ASSESSMENT / PLAN:  PULMONARY A: Acute Respiratory Failure of sudden onset - etiology favor PNA, ARDS Possible HAP Concern for PE given sudden onset and clinical/radiographic disparity  P:   Slight permissive hypercapnia, abg reviewed, maxed MV   LE venous Doppler studies - pending ARDs protocol, maintain, keep plat 25-30 Peep / fio2 per ARDS, gotal to 40% peep 8 if able pcxr in am  No prone with improved O2 , peep  CARDIOVASCULAR A: Chronic RBBB H/O CAD H/O HTN Septic shock improved P: If neg doppler, would dc hep IV to sub q  Continue Crestor, ASA, Clopidogrel.  MAP adequate on own  RENAL A:  Acute renal failure, ATN P:   Dc bicarb, k wnl Add saline kvo, cvp 12  GASTROINTESTINAL A:   NPO, ARDS P:   SUP: IV pantoprazole TFs oxepa  HEMATOLOGIC A:   Low clinical suspicion PE P:  DVT px: full dose UFH, dc to sub q hep if doppler legs neg, hope done today Avoid contrast, allergy and ARF  INFECTIOUS A: septic shock PNA, asp>? P:   Continue vanc, zosyn, atypical levo Await urine leg , strep (neg)  ENDOCRINE A:  DM 2 R/o rel AI P:   SSI protocol ordered consider reduction steroids as off pressors  NEUROLOGIC A:  Profound agitated delirium/acute encephalopathy Vent dyssynchrony Severe L hip pain - out of proportion to CT findings   ? Missed fx or prosthesis displacement ARDS P:   Cisatracurium, fent, versed, likley can dc paralysis improved O2 needs Consider re-imaging of L hip ehan stable for this, vent an issue now Daily WUA can today  I have personally obtained a history, examined the patient, evaluated laboratory and imaging results, formulated the assessment and plan and placed orders. CRITICAL CARE: The patient is critically ill with multiple organ systems failure and requires high complexity decision making for assessment and support, frequent evaluation and titration of therapies, application of advanced monitoring technologies and extensive interpretation of multiple databases. Critical Care Time devoted to patient care services described in this note is 30 minutes.   Mcarthur Rossettianiel J. Tyson AliasFeinstein, MD, FACP Pgr: [phone] [place] [facility]

## 2013-12-02 ENCOUNTER — Inpatient Hospital Stay (HOSPITAL_COMMUNITY): Payer: Medicare Other

## 2013-12-02 LAB — POCT I-STAT 3, ART BLOOD GAS (G3+)
Bicarbonate: 25.5 mEq/L — ABNORMAL HIGH (ref 20.0–24.0)
O2 SAT: 89 %
TCO2: 27 mmol/L (ref 0–100)
pCO2 arterial: 44.6 mmHg (ref 35.0–45.0)
pH, Arterial: 7.366 (ref 7.350–7.450)
pO2, Arterial: 58 mmHg — ABNORMAL LOW (ref 80.0–100.0)

## 2013-12-02 LAB — BASIC METABOLIC PANEL
BUN: 83 mg/dL — AB (ref 6–23)
CHLORIDE: 109 meq/L (ref 96–112)
CO2: 25 mEq/L (ref 19–32)
Calcium: 8 mg/dL — ABNORMAL LOW (ref 8.4–10.5)
Creatinine, Ser: 2.28 mg/dL — ABNORMAL HIGH (ref 0.50–1.35)
GFR calc non Af Amer: 26 mL/min — ABNORMAL LOW (ref >90)
GFR, EST AFRICAN AMERICAN: 30 mL/min — AB (ref >90)
Glucose, Bld: 176 mg/dL — ABNORMAL HIGH (ref 70–99)
Potassium: 3.8 mEq/L (ref 3.7–5.3)
Sodium: 148 mEq/L — ABNORMAL HIGH (ref 137–147)

## 2013-12-02 LAB — CBC WITH DIFFERENTIAL/PLATELET
Basophils Absolute: 0 10*3/uL (ref 0.0–0.1)
Basophils Relative: 0 % (ref 0–1)
EOS PCT: 0 % (ref 0–5)
Eosinophils Absolute: 0 10*3/uL (ref 0.0–0.7)
HCT: 26.6 % — ABNORMAL LOW (ref 39.0–52.0)
Hemoglobin: 8.7 g/dL — ABNORMAL LOW (ref 13.0–17.0)
LYMPHS ABS: 1.4 10*3/uL (ref 0.7–4.0)
Lymphocytes Relative: 7 % — ABNORMAL LOW (ref 12–46)
MCH: 33.5 pg (ref 26.0–34.0)
MCHC: 32.7 g/dL (ref 30.0–36.0)
MCV: 102.3 fL — AB (ref 78.0–100.0)
MONOS PCT: 6 % (ref 3–12)
Monocytes Absolute: 1.2 10*3/uL — ABNORMAL HIGH (ref 0.1–1.0)
Neutro Abs: 17.3 10*3/uL — ABNORMAL HIGH (ref 1.7–7.7)
Neutrophils Relative %: 87 % — ABNORMAL HIGH (ref 43–77)
Platelets: 205 10*3/uL (ref 150–400)
RBC: 2.6 MIL/uL — AB (ref 4.22–5.81)
RDW: 15.9 % — ABNORMAL HIGH (ref 11.5–15.5)
WBC: 19.9 10*3/uL — AB (ref 4.0–10.5)

## 2013-12-02 LAB — GLUCOSE, CAPILLARY
GLUCOSE-CAPILLARY: 161 mg/dL — AB (ref 70–99)
GLUCOSE-CAPILLARY: 201 mg/dL — AB (ref 70–99)
GLUCOSE-CAPILLARY: 162 mg/dL — AB (ref 70–99)
Glucose-Capillary: 139 mg/dL — ABNORMAL HIGH (ref 70–99)
Glucose-Capillary: 168 mg/dL — ABNORMAL HIGH (ref 70–99)
Glucose-Capillary: 174 mg/dL — ABNORMAL HIGH (ref 70–99)

## 2013-12-02 MED ORDER — HYDROCORTISONE NA SUCCINATE PF 100 MG IJ SOLR
25.0000 mg | Freq: Two times a day (BID) | INTRAMUSCULAR | Status: DC
  Administered 2013-12-02 – 2013-12-03 (×3): 25 mg via INTRAVENOUS
  Filled 2013-12-02 (×5): qty 0.5

## 2013-12-02 MED ORDER — FREE WATER
300.0000 mL | Freq: Three times a day (TID) | Status: DC
  Administered 2013-12-02 – 2013-12-03 (×4): 300 mL

## 2013-12-02 NOTE — Progress Notes (Signed)
PULMONARY / CRITICAL CARE MEDICINE   Name: Ronald Frank MRN: [id number] DOB: May 19, 1935    ADMISSION DATE:  11/28/2013 CONSULTATION DATE:  11/29/2013  REFERRING MD :  Benjamine Mola PRIMARY SERVICE: PCCM  CHIEF COMPLAINT:  Resp Distress  BRIEF PATIENT DESCRIPTION: 79 y.o. M admitted 3/16 for fall, suffered L hip contusion with no fx or prosthesis displacment noted on CT of hip.  Developed acute onset tachypnea, tachycardia, hypoxia 3/17.  RRT paged and PCCM called for transfer to ICU and ETT placed.  SIGNIFICANT EVENTS / STUDIES:  3/16 - admitted after mechanical fall at home. 3/17 - developed respiratory distress with RR 44, HR 130, SpO2 91%.  3/17 echo -55%, rt heart slight reduction 3/18 doppler>>>neg lowers 3/19- off pressors  LINES / TUBES: ETT 3/17 >>  L [place] CVL 3/17 >>> A line rt radial 3/17>>>  CULTURES: Blood 3/17 >>  Urine 3/17 >>  Resp 3/17 >> staph aureus>>>  ANTIBIOTICS: Ceftriaxone 3/17 x 1 Vanc 3/17 >>> Zosyn 3/17 >>> levofloxacin (bilat process, atypical?)3/18>>>3/20  SUBJECTIVE:  Off pressors, pos balance now  VITAL SIGNS: Temp:  [98.3 F (36.8 C)-102.8 F (39.3 C)] 101 F (38.3 C) (03/20 0837) Pulse Rate:  [107-125] 114 (03/20 0800) Resp:  [16-32] 16 (03/20 0800) BP: (97-135)/(34-53) 135/53 mmHg (03/20 0800) SpO2:  [91 %-96 %] 92 % (03/20 0800) Arterial Line BP: (119-181)/(31-55) 181/55 mmHg (03/20 0800) FiO2 (%):  [40 %-50 %] 40 % (03/20 0752) Weight:  [102.3 kg (225 lb 8.5 oz)] 102.3 kg (225 lb 8.5 oz) (03/20 0400) HEMODYNAMICS: CVP:  [12 mmHg-17 mmHg] 12 mmHg VENTILATOR SETTINGS: Vent Mode:  [-] PRVC FiO2 (%):  [40 %-50 %] 40 % Set Rate:  [32 bmp] 32 bmp Vt Set:  [500 mL] 500 mL PEEP:  [5 cmH20-8 cmH20] 5 cmH20 Plateau Pressure:  [17 cmH20-26 cmH20] 17 cmH20 INTAKE / OUTPUT: Intake/Output     03/19 0701 - 03/20 0700 03/20 0701 - 03/21 0700   I.V. (mL/kg) 986 (9.6) 50 (0.5)   NG/GT 848 30   IV Piggyback 400    Total Intake(mL/kg) 2234  (21.8) 80 (0.8)   Urine (mL/kg/hr) 1495 (0.6)    Total Output 1495     Net +739 +80        Stool Occurrence 1 x      PHYSICAL EXAMINATION: General: off paralysis Neuro: deep rass -5 HEENT: [place]/AT. PERRL, line clean Cardiovascular:  s1 s2 RRT no m Lungs: coarse Abdomen: BS x 4, soft, NT/ND.  Ext: 1 edema Skin: no rashes.    LABS:  CBC  Recent Labs Lab 11/30/13 0154 12/01/13 0420 12/02/13 0400  WBC 27.0* 18.5* 19.9*  HGB 11.5* 9.0* 8.7*  HCT 34.5* 27.6* 26.6*  PLT 228 179 205   Coag's  Recent Labs Lab 11/29/13 1955  APTT 56*  INR 1.30   BMET  Recent Labs Lab 11/30/13 2008 12/01/13 0420 12/02/13 0400  NA 139 139 148*  K 4.5 3.9 3.8  CL 104 101 109  CO2 22 23 25   BUN 39* 50* 83*  CREATININE 1.85* 1.95* 2.28*  GLUCOSE 216* 232* 176*   Electrolytes  Recent Labs Lab 11/30/13 1418 11/30/13 2008 12/01/13 0420 12/02/13 0400  CALCIUM 7.9* 7.9* 7.7* 8.0*  MG 2.1  --  2.0  --   PHOS 2.5  --  2.7  --    Sepsis Markers  Recent Labs Lab 11/29/13 1348 11/29/13 1955 11/30/13 0420 12/01/13 0420  LATICACIDVEN 3.2*  --   --   --  PROCALCITON  --  1.69 5.37 4.93   ABG  Recent Labs Lab 11/30/13 0641 11/30/13 1359 12/01/13 0430  PHART 7.205* 7.290* 7.289*  PCO2ART 48.4* 49.6* 50.8*  PO2ART 88.0 104.0* 90.1   Liver Enzymes  Recent Labs Lab 11/30/13 0154 12/01/13 0420  AST 21 49*  ALT 20 37  ALKPHOS 54 47  BILITOT 0.3 <0.2*  ALBUMIN 2.7* 2.0*   Cardiac Enzymes  Recent Labs Lab 11/29/13 1331 11/29/13 1955 11/30/13 0420  TROPONINI <0.30 0.39* <0.30  PROBNP  --  2322.0* 2535.0*   Glucose  Recent Labs Lab 12/01/13 1220 12/01/13 1554 12/01/13 1844 12/02/13 0004 12/02/13 0356 12/02/13 0805  GLUCAP 199* 152* 143* 174* 161* 139*    CXR: bilat interstial  infiltrates,left haziness, et twnl  ASSESSMENT / PLAN:  PULMONARY A: Acute Respiratory Failure of sudden onset - etiology favor PNA, ARDS Possible HAP,  ASPiration Concern for PE given sudden onset and clinical/radiographic disparity  P:   Maintain ards protocol Keep paralysis off Keep plat less 25-30 For neg balance if able, cvp up = factt pcxr in am  O2 needs have improved significantly, goal to reduce MV, if able then SBT planned, need rass up and also repeat ph Permissive hypercapnia ok  CARDIOVASCULAR A: Chronic RBBB H/O CAD H/O HTN Septic shock resolved P: Low clinical suspicion PE and neg doppler, dc hep done Continue Crestor, ASA, Clopidogrel.  Beta blocker prn, may need maintenance Keep a line for draws, not BP , inaccurate  RENAL A:  Acute renal failure, ATN cvp 12 hypernatremia P:   Keep even kvo bmet am No lasix for now, follow crt trend Add free water  GASTROINTESTINAL A:   ARDS P:   SUP: IV pantoprazole TFs oxepa  HEMATOLOGIC A:   Low clinical suspicion PE P:  DVT px: full dose UFH, dc to sub q hep if doppler legs neg, hope done today Avoid contrast, allergy and ARF  INFECTIOUS A: septic shock PNA, aspiration R/o MRSA P:   Continue vanc, zosyn in am if MSSA change all to ancef, dc vanc, zosyn Dc levofloxacin  ENDOCRINE A:  DM 2 R/o rel AI P:   SSI protocol ordered lantus consider reduction steroids further  NEUROLOGIC A:  Profound agitated delirium/acute encephalopathy Vent dyssynchrony Severe L hip pain - out of proportion to CT findings   ? Missed fx or prosthesis displacement ARDS P:   rass goal to -2 wua upight  I have personally obtained a history, examined the patient, evaluated laboratory and imaging results, formulated the assessment and plan and placed orders. CRITICAL CARE: The patient is critically ill with multiple organ systems failure and requires high complexity decision making for assessment and support, frequent evaluation and titration of therapies, application of advanced monitoring technologies and extensive interpretation of multiple databases. Critical  Care Time devoted to patient care services described in this note is 30 minutes.   Mcarthur Rossettianiel J. Tyson AliasFeinstein, MD, FACP Pgr: [phone] [place] [facility]

## 2013-12-02 NOTE — Progress Notes (Signed)
Versed ivpb gtt wasted 20 mg with shakeira whyte rn in sink

## 2013-12-03 ENCOUNTER — Inpatient Hospital Stay (HOSPITAL_COMMUNITY): Payer: Medicare Other

## 2013-12-03 DIAGNOSIS — N289 Disorder of kidney and ureter, unspecified: Secondary | ICD-10-CM

## 2013-12-03 DIAGNOSIS — J9589 Other postprocedural complications and disorders of respiratory system, not elsewhere classified: Secondary | ICD-10-CM

## 2013-12-03 DIAGNOSIS — A419 Sepsis, unspecified organism: Secondary | ICD-10-CM

## 2013-12-03 DIAGNOSIS — E870 Hyperosmolality and hypernatremia: Secondary | ICD-10-CM | POA: Diagnosis not present

## 2013-12-03 DIAGNOSIS — J80 Acute respiratory distress syndrome: Secondary | ICD-10-CM | POA: Diagnosis not present

## 2013-12-03 DIAGNOSIS — R6520 Severe sepsis without septic shock: Secondary | ICD-10-CM

## 2013-12-03 DIAGNOSIS — I4891 Unspecified atrial fibrillation: Secondary | ICD-10-CM

## 2013-12-03 LAB — CBC WITH DIFFERENTIAL/PLATELET
BASOS ABS: 0 10*3/uL (ref 0.0–0.1)
BASOS PCT: 0 % (ref 0–1)
EOS ABS: 0 10*3/uL (ref 0.0–0.7)
Eosinophils Relative: 0 % (ref 0–5)
HCT: 31 % — ABNORMAL LOW (ref 39.0–52.0)
HEMOGLOBIN: 10.1 g/dL — AB (ref 13.0–17.0)
LYMPHS PCT: 6 % — AB (ref 12–46)
Lymphs Abs: 1.4 10*3/uL (ref 0.7–4.0)
MCH: 33.1 pg (ref 26.0–34.0)
MCHC: 32.6 g/dL (ref 30.0–36.0)
MCV: 101.6 fL — ABNORMAL HIGH (ref 78.0–100.0)
MONO ABS: 1.1 10*3/uL — AB (ref 0.1–1.0)
Monocytes Relative: 5 % (ref 3–12)
Neutro Abs: 20.4 10*3/uL — ABNORMAL HIGH (ref 1.7–7.7)
Neutrophils Relative %: 89 % — ABNORMAL HIGH (ref 43–77)
Platelets: 239 10*3/uL (ref 150–400)
RBC: 3.05 MIL/uL — ABNORMAL LOW (ref 4.22–5.81)
RDW: 16.1 % — AB (ref 11.5–15.5)
WBC: 22.9 10*3/uL — ABNORMAL HIGH (ref 4.0–10.5)

## 2013-12-03 LAB — BLOOD GAS, ARTERIAL
Acid-Base Excess: 0.3 mmol/L (ref 0.0–2.0)
Acid-Base Excess: 1.6 mmol/L (ref 0.0–2.0)
BICARBONATE: 24.6 meq/L — AB (ref 20.0–24.0)
Bicarbonate: 26 mEq/L — ABNORMAL HIGH (ref 20.0–24.0)
Drawn by: 252031
FIO2: 0.4 %
FIO2: 0.6 %
LHR: 32 {breaths}/min
LHR: 20 {breaths}/min
MECHVT: 500 mL
O2 Saturation: 86.1 %
O2 Saturation: 89.8 %
PATIENT TEMPERATURE: 98.6
PCO2 ART: 41.3 mmHg (ref 35.0–45.0)
PEEP: 5 cmH2O
PEEP: 10 cmH2O
PO2 ART: 53.8 mmHg — AB (ref 80.0–100.0)
PRESSURE CONTROL: 10 cmH2O
Patient temperature: 98.6
TCO2: 25.9 mmol/L (ref 0–100)
TCO2: 27.3 mmol/L (ref 0–100)
pCO2 arterial: 43.3 mmHg (ref 35.0–45.0)
pH, Arterial: 7.393 (ref 7.350–7.450)
pH, Arterial: 7.395 (ref 7.350–7.450)
pO2, Arterial: 60.4 mmHg — ABNORMAL LOW (ref 80.0–100.0)

## 2013-12-03 LAB — GLUCOSE, CAPILLARY
GLUCOSE-CAPILLARY: 212 mg/dL — AB (ref 70–99)
GLUCOSE-CAPILLARY: 193 mg/dL — AB (ref 70–99)
Glucose-Capillary: 178 mg/dL — ABNORMAL HIGH (ref 70–99)
Glucose-Capillary: 184 mg/dL — ABNORMAL HIGH (ref 70–99)
Glucose-Capillary: 193 mg/dL — ABNORMAL HIGH (ref 70–99)
Glucose-Capillary: 209 mg/dL — ABNORMAL HIGH (ref 70–99)

## 2013-12-03 LAB — CULTURE, RESPIRATORY

## 2013-12-03 LAB — COMPREHENSIVE METABOLIC PANEL
ALK PHOS: 62 U/L (ref 39–117)
ALT: 144 U/L — AB (ref 0–53)
AST: 102 U/L — AB (ref 0–37)
Albumin: 2.1 g/dL — ABNORMAL LOW (ref 3.5–5.2)
BILIRUBIN TOTAL: 0.3 mg/dL (ref 0.3–1.2)
BUN: 66 mg/dL — ABNORMAL HIGH (ref 6–23)
CHLORIDE: 113 meq/L — AB (ref 96–112)
CO2: 24 mEq/L (ref 19–32)
Calcium: 8.4 mg/dL (ref 8.4–10.5)
Creatinine, Ser: 1.64 mg/dL — ABNORMAL HIGH (ref 0.50–1.35)
GFR calc Af Amer: 45 mL/min — ABNORMAL LOW (ref >90)
GFR calc non Af Amer: 39 mL/min — ABNORMAL LOW (ref >90)
Glucose, Bld: 264 mg/dL — ABNORMAL HIGH (ref 70–99)
Potassium: 4.1 mEq/L (ref 3.7–5.3)
SODIUM: 152 meq/L — AB (ref 137–147)
TOTAL PROTEIN: 6.1 g/dL (ref 6.0–8.3)

## 2013-12-03 LAB — CULTURE, RESPIRATORY W GRAM STAIN: Special Requests: NORMAL

## 2013-12-03 MED ORDER — PANTOPRAZOLE SODIUM 40 MG PO PACK
40.0000 mg | PACK | Freq: Every day | ORAL | Status: DC
  Administered 2013-12-03 – 2013-12-04 (×2): 40 mg
  Filled 2013-12-03 (×2): qty 20

## 2013-12-03 MED ORDER — DILTIAZEM LOAD VIA INFUSION
10.0000 mg | Freq: Once | INTRAVENOUS | Status: AC
  Administered 2013-12-03 (×2): 10 mg via INTRAVENOUS
  Filled 2013-12-03: qty 10

## 2013-12-03 MED ORDER — DILTIAZEM HCL 100 MG IV SOLR
5.0000 mg/h | INTRAVENOUS | Status: DC
Start: 2013-12-03 — End: 2013-12-07
  Administered 2013-12-03 (×3): 20 mg/h via INTRAVENOUS
  Administered 2013-12-03 (×2): 15 mg/h via INTRAVENOUS
  Administered 2013-12-04 (×3): 20 mg/h via INTRAVENOUS
  Administered 2013-12-04: 15 mg/h via INTRAVENOUS
  Administered 2013-12-04 – 2013-12-05 (×5): 20 mg/h via INTRAVENOUS
  Administered 2013-12-06: 10 mg/h via INTRAVENOUS
  Administered 2013-12-06: 20 mg/h via INTRAVENOUS
  Administered 2013-12-07: 10 mg/h via INTRAVENOUS
  Filled 2013-12-03 (×18): qty 100

## 2013-12-03 MED ORDER — AMIODARONE HCL IN DEXTROSE 360-4.14 MG/200ML-% IV SOLN
INTRAVENOUS | Status: AC
  Filled 2013-12-03: qty 200

## 2013-12-03 MED ORDER — DILTIAZEM HCL 25 MG/5ML IV SOLN: 10.0000 mg | Freq: Once | INTRAVENOUS | Status: DC

## 2013-12-03 MED ORDER — DILTIAZEM HCL 25 MG/5ML IV SOLN
10.0000 mg | Freq: Once | INTRAVENOUS | Status: AC
  Administered 2013-12-03: 10 mg via INTRAVENOUS
  Filled 2013-12-03: qty 5

## 2013-12-03 MED ORDER — LORAZEPAM 2 MG/ML IJ SOLN: 1.0000 mg | Freq: Once | INTRAMUSCULAR | Status: DC

## 2013-12-03 MED ORDER — FUROSEMIDE 10 MG/ML IJ SOLN
40.0000 mg | Freq: Three times a day (TID) | INTRAMUSCULAR | Status: AC
  Administered 2013-12-03 (×2): 40 mg via INTRAVENOUS
  Filled 2013-12-03 (×2): qty 4

## 2013-12-03 MED ORDER — FREE WATER
300.0000 mL | Freq: Four times a day (QID) | Status: DC
  Administered 2013-12-03 – 2013-12-04 (×4): 300 mL

## 2013-12-03 MED ORDER — HYDRALAZINE HCL 20 MG/ML IJ SOLN
10.0000 mg | INTRAMUSCULAR | Status: DC | PRN
  Administered 2013-12-06: 10 mg via INTRAVENOUS
  Filled 2013-12-03: qty 1

## 2013-12-03 NOTE — Progress Notes (Signed)
PULMONARY / CRITICAL CARE MEDICINE   Name: Ronald Frank MRN: [id number] DOB: 11/05/1934    ADMISSION DATE:  11/28/2013 CONSULTATION DATE:  11/29/2013  REFERRING MD :  Benjamine MolaVann PRIMARY SERVICE: PCCM  CHIEF COMPLAINT:  Resp Distress  BRIEF PATIENT DESCRIPTION: 79 y.o. M admitted 3/16 for fall, suffered L hip contusion with no fx or prosthesis displacment noted on CT of hip.  Developed acute onset tachypnea, tachycardia, hypoxia 3/17.  RRT paged and PCCM called for transfer to ICU and ETT placed.  SIGNIFICANT EVENTS / STUDIES:  3/16 - admitted after mechanical fall at home. 3/17 - developed respiratory distress with RR 44, HR 130, SpO2 91%.  3/17 echo -55%, rt heart slight reduction 3/18 doppler>>>neg lowers 3/19- off pressors 3/21- back on pressors, afib rvr  LINES / TUBES: ETT 3/17 >>  L [place] CVL 3/17 >>> A line rt radial 3/17>>>  CULTURES: Blood 3/17 >> neg Urine 3/17 >> neg Resp 3/17 >> staph aureus>>>  ANTIBIOTICS: Ceftriaxone 3/17 x 1 Vanc 3/17 >>> Zosyn 3/17 >>> levofloxacin (bilat process, atypical?)3/18>>>3/20  SUBJECTIVE:  Off  pressors, afib RVR on dilt drip  VITAL SIGNS: Temp:  [100.2 F (37.9 C)-103.1 F (39.5 C)] 100.2 F (37.9 C) (03/21 0400) Pulse Rate:  [49-187] 62 (03/21 0700) Resp:  [10-32] 32 (03/21 0700) BP: (103-174)/(41-89) 115/41 mmHg (03/21 0700) SpO2:  [74 %-100 %] 92 % (03/21 0700) Arterial Line BP: (150-255)/(32-76) 211/48 mmHg (03/21 0700) FiO2 (%):  [40 %] 40 % (03/21 0400) Weight:  [102.1 kg (225 lb 1.4 oz)] 102.1 kg (225 lb 1.4 oz) (03/21 0319) HEMODYNAMICS: CVP:  [12 mmHg] 12 mmHg Off pressors, on dilt drip VENTILATOR SETTINGS: Vent Mode:  [-] PRVC FiO2 (%):  [40 %] 40 % Set Rate:  [32 bmp] 32 bmp Vt Set:  [500 mL] 500 mL PEEP:  [5 cmH20] 5 cmH20 Pressure Support:  [8 cmH20] 8 cmH20 Plateau Pressure:  [10 cmH20-17 cmH20] 10 cmH20 INTAKE / OUTPUT: Intake/Output     03/20 0701 - 03/21 0700 03/21 0701 - 03/22 0700   I.V. (mL/kg)  1061.3 (10.4)    NG/GT 810    IV Piggyback 400    Total Intake(mL/kg) 2271.3 (22.2)    Urine (mL/kg/hr) 2225 (0.9)    Stool 501 (0.2)    Total Output 2726     Net -454.8          Stool Occurrence 1 x      PHYSICAL EXAMINATION: General: off paralysis Neuro: rass -2 HEENT: [place]/AT. PERRL, line clean Cardiovascular:  s1 s2 RRT no m Lungs: coarse Abdomen: BS x 4, soft, NT/ND.  Ext: 1 edema Skin: no rashes.    LABS:  CBC  Recent Labs Lab 12/01/13 0420 12/02/13 0400 12/03/13 0425  WBC 18.5* 19.9* 22.9*  HGB 9.0* 8.7* 10.1*  HCT 27.6* 26.6* 31.0*  PLT 179 205 239   Coag's  Recent Labs Lab 11/29/13 1955  APTT 56*  INR 1.30   BMET  Recent Labs Lab 12/01/13 0420 12/02/13 0400 12/03/13 0425  NA 139 148* 152*  K 3.9 3.8 4.1  CL 101 109 113*  CO2 23 25 24   BUN 50* 83* 66*  CREATININE 1.95* 2.28* 1.64*  GLUCOSE 232* 176* 264*   Electrolytes  Recent Labs Lab 11/30/13 1418  12/01/13 0420 12/02/13 0400 12/03/13 0425  CALCIUM 7.9*  < > 7.7* 8.0* 8.4  MG 2.1  --  2.0  --   --   PHOS 2.5  --  2.7  --   --   < > =  values in this interval not displayed. Sepsis Markers  Recent Labs Lab 11/29/13 1348 11/29/13 1955 11/30/13 0420 12/01/13 0420  LATICACIDVEN 3.2*  --   --   --   PROCALCITON  --  1.69 5.37 4.93   ABG  Recent Labs Lab 12/01/13 0430 12/02/13 0901 12/03/13 0404  PHART 7.289* 7.366 7.393  PCO2ART 50.8* 44.6 41.3  PO2ART 90.1 58.0* 53.8*   Liver Enzymes  Recent Labs Lab 11/30/13 0154 12/01/13 0420 12/03/13 0425  AST 21 49* 102*  ALT 20 37 144*  ALKPHOS 54 47 62  BILITOT 0.3 <0.2* 0.3  ALBUMIN 2.7* 2.0* 2.1*   Cardiac Enzymes  Recent Labs Lab 11/29/13 1331 11/29/13 1955 11/30/13 0420  TROPONINI <0.30 0.39* <0.30  PROBNP  --  2322.0* 2535.0*   Glucose  Recent Labs Lab 12/02/13 1135 12/02/13 1525 12/02/13 1922 12/02/13 2331 12/03/13 0418 12/03/13 0710  GLUCAP 201* 162* 168* 178* 212* 193*    CXR: bilat  interstial  infiltrates,left haziness sl improved, et twnl  ASSESSMENT / PLAN:  PULMONARY A: Acute Respiratory Failure of sudden onset - etiology favor PNA, ARDS Possible HAP, ASPiration P:   Maintain ards protocol Keep paralysis off Keep plat less 25-30 For neg balance if able, cvp up = factt>>more lasix pcxr in am   Permissive hypercapnia ok  CARDIOVASCULAR A: Chronic RBBB H/O CAD H/O HTN Septic shock resolved P: Low clinical suspicion PE and neg doppler, dc hep done Continue Crestor, ASA, Clopidogrel.  Beta blocker prn, may need maintenance Keep a line for draws, not BP , inaccurate  RENAL A:  Acute renal failure, ATN cvp 12 hypernatremia P:   Sl neg I/O with more lasix kvo bmet am More  free water  GASTROINTESTINAL A:   ARDS P:   SUP: Per tube PPI TFs oxepa  HEMATOLOGIC A:   Low clinical suspicion PE P:  DVT on hep sq now Avoid contrast, allergy and ARF  INFECTIOUS A: septic shock PNA, aspiration R/o MRSA P:   Continue vanc, zosyn in am if MSSA change all to ancef, dc vanc, zosyn   ENDOCRINE A:  DM 2 R/o rel AI P:   SSI protocol ordered lantus consider reduction steroids further  NEUROLOGIC A:  Profound agitated delirium/acute encephalopathy Vent dyssynchrony Severe L hip pain - out of proportion to CT findings   ? Missed fx or prosthesis displacement ARDS P:   rass goal to -2 wua upight  I have personally obtained a history, examined the patient, evaluated laboratory and imaging results, formulated the assessment and plan and placed orders. CRITICAL CARE: The patient is critically ill with multiple organ systems failure and requires high complexity decision making for assessment and support, frequent evaluation and titration of therapies, application of advanced monitoring technologies and extensive interpretation of multiple databases. Critical Care Time devoted to patient care services described in this note is 30 minutes.    Dorcas Carrow Beeper  [phone]  Cell  [phone]  If no response or cell goes to voicemail, call beeper [phone]  [facility]

## 2013-12-03 NOTE — Progress Notes (Signed)
eLink Physician-Brief Progress Note Patient Name: Melvia HeapsHenry T Tauzin DOB: 12/12/1934 MRN: [id number]  Date of Service  12/03/2013   HPI/Events of Note  Patient with AF/RVR which did not respond to BB x 2 or dilt 10 mg IV times one.  Remains HD stable.   eICU Interventions  Plan: Additional Dilt 10 mg IV times one followed by dilt gtt. Nurse to call in an hour to hour and 1/2 if no response. Consider amio   Intervention Category Intermediate Interventions: Arrhythmia - evaluation and management  Raquelle Pietro 12/03/2013, 3:23 AM

## 2013-12-04 ENCOUNTER — Inpatient Hospital Stay (HOSPITAL_COMMUNITY): Payer: Medicare Other

## 2013-12-04 DIAGNOSIS — E870 Hyperosmolality and hypernatremia: Secondary | ICD-10-CM

## 2013-12-04 LAB — GLUCOSE, CAPILLARY
GLUCOSE-CAPILLARY: 224 mg/dL — AB (ref 70–99)
GLUCOSE-CAPILLARY: 251 mg/dL — AB (ref 70–99)
GLUCOSE-CAPILLARY: 235 mg/dL — AB (ref 70–99)
Glucose-Capillary: 186 mg/dL — ABNORMAL HIGH (ref 70–99)
Glucose-Capillary: 261 mg/dL — ABNORMAL HIGH (ref 70–99)
Glucose-Capillary: 281 mg/dL — ABNORMAL HIGH (ref 70–99)
Glucose-Capillary: 295 mg/dL — ABNORMAL HIGH (ref 70–99)

## 2013-12-04 LAB — BASIC METABOLIC PANEL
BUN: 81 mg/dL — ABNORMAL HIGH (ref 6–23)
CO2: 27 mEq/L (ref 19–32)
CREATININE: 1.93 mg/dL — AB (ref 0.50–1.35)
Calcium: 8.1 mg/dL — ABNORMAL LOW (ref 8.4–10.5)
Chloride: 114 mEq/L — ABNORMAL HIGH (ref 96–112)
GFR calc Af Amer: 37 mL/min — ABNORMAL LOW (ref >90)
GFR calc non Af Amer: 32 mL/min — ABNORMAL LOW (ref >90)
Glucose, Bld: 319 mg/dL — ABNORMAL HIGH (ref 70–99)
Potassium: 3.3 mEq/L — ABNORMAL LOW (ref 3.7–5.3)
Sodium: 156 mEq/L — ABNORMAL HIGH (ref 137–147)

## 2013-12-04 LAB — CBC
HCT: 30 % — ABNORMAL LOW (ref 39.0–52.0)
Hemoglobin: 9.7 g/dL — ABNORMAL LOW (ref 13.0–17.0)
MCH: 33.3 pg (ref 26.0–34.0)
MCHC: 32.3 g/dL (ref 30.0–36.0)
MCV: 103.1 fL — AB (ref 78.0–100.0)
Platelets: 239 10*3/uL (ref 150–400)
RBC: 2.91 MIL/uL — AB (ref 4.22–5.81)
RDW: 16.5 % — AB (ref 11.5–15.5)
WBC: 26.9 10*3/uL — ABNORMAL HIGH (ref 4.0–10.5)

## 2013-12-04 MED ORDER — ACETAMINOPHEN 160 MG/5ML PO SOLN
650.0000 mg | ORAL | Status: DC | PRN
  Administered 2013-12-04 – 2013-12-10 (×9): 650 mg
  Filled 2013-12-04 (×8): qty 20.3

## 2013-12-04 MED ORDER — LEVOFLOXACIN IN D5W 750 MG/150ML IV SOLN
750.0000 mg | INTRAVENOUS | Status: DC
  Administered 2013-12-04: 750 mg via INTRAVENOUS
  Filled 2013-12-04 (×2): qty 150

## 2013-12-04 MED ORDER — POTASSIUM CHLORIDE 20 MEQ/15ML (10%) PO LIQD
40.0000 meq | Freq: Once | ORAL | Status: AC
  Administered 2013-12-04: 40 meq
  Filled 2013-12-04: qty 30

## 2013-12-04 MED ORDER — INSULIN GLARGINE 100 UNIT/ML ~~LOC~~ SOLN
20.0000 [IU] | Freq: Once | SUBCUTANEOUS | Status: AC
  Administered 2013-12-04: 20 [IU] via SUBCUTANEOUS
  Filled 2013-12-04: qty 0.2

## 2013-12-04 MED ORDER — METOPROLOL TARTRATE 1 MG/ML IV SOLN
5.0000 mg | Freq: Four times a day (QID) | INTRAVENOUS | Status: DC | PRN
  Administered 2013-12-04: 5 mg via INTRAVENOUS
  Filled 2013-12-04 (×2): qty 5

## 2013-12-04 MED ORDER — INSULIN GLARGINE 100 UNIT/ML ~~LOC~~ SOLN
20.0000 [IU] | Freq: Every day | SUBCUTANEOUS | Status: DC
  Administered 2013-12-05: 20 [IU] via SUBCUTANEOUS
  Filled 2013-12-04 (×2): qty 0.2

## 2013-12-04 MED ORDER — FREE WATER
300.0000 mL | Status: DC
  Administered 2013-12-04 – 2013-12-05 (×5): 300 mL

## 2013-12-04 MED ORDER — FUROSEMIDE 10 MG/ML IJ SOLN
80.0000 mg | Freq: Three times a day (TID) | INTRAMUSCULAR | Status: AC
  Administered 2013-12-04 – 2013-12-05 (×3): 80 mg via INTRAVENOUS
  Filled 2013-12-04 (×2): qty 8

## 2013-12-04 NOTE — Progress Notes (Signed)
eLink Physician-Brief Progress Note Patient Name: Ronald HeapsHenry T Frank DOB: 05/21/1935 MRN: [id number]  Date of Service  12/04/2013   HPI/Events of Note   Tachycardia, A Fib   eICU Interventions  Increased dilt ceiling to 20 Increased metoprolol to 5mg  q6h prn   Intervention Category Intermediate Interventions: Arrhythmia - evaluation and management  BYRUM,ROBERT S. 12/04/2013, 11:20 PM

## 2013-12-04 NOTE — Progress Notes (Signed)
[facility] REPLAC[facility]OC[facility] FOR AM LAB REPLACEMENT ONLY  The patient does not apply for [facility]lyte [facility] Protocol based on the criteria listed below:     Is BUN < 60 mg/dL? no  Patient's BUN today is 81  Abnormal electrolyte(s):K3.3   If a panic level lab has been reported, has the CCM MD in charge been notified? yes.   Physician:  E Deterding,MD  Ronald NakayamaChisholm, Ronald Frank William 12/04/2013 6:10 AM

## 2013-12-04 NOTE — Progress Notes (Signed)
PULMONARY / CRITICAL CARE MEDICINE   Name: Ronald HeapsHenry T Mathurin MRN: [id number] DOB: 05/13/1935    ADMISSION DATE:  11/28/2013 CONSULTATION DATE:  11/29/2013  REFERRING MD :  Benjamine MolaVann PRIMARY SERVICE: PCCM  CHIEF COMPLAINT:  Resp Distress  BRIEF PATIENT DESCRIPTION: 79 y.o. M admitted 3/16 for fall, suffered L hip contusion with no fx or prosthesis displacment noted on CT of hip.  Developed acute onset tachypnea, tachycardia, hypoxia 3/17.  RRT paged and PCCM called for transfer to ICU and ETT placed.  SIGNIFICANT EVENTS / STUDIES:  3/16 - admitted after mechanical fall at home. 3/17 - developed respiratory distress with RR 44, HR 130, SpO2 91%.  3/17 echo -55%, rt heart slight reduction 3/18 doppler>>>neg lowers 3/19- off pressors 3/21- back on pressors, afib rvr 3/22- on PCV, sedated ,   LINES / TUBES: ETT 3/17 >>  L [place] CVL 3/17 >>> A line rt radial 3/17>>>  CULTURES: Blood 3/17 >> neg Urine 3/17 >> neg Resp 3/17 >> staph aureus>>>MSSA  ANTIBIOTICS: Ceftriaxone 3/17 x 1 Vanc 3/17 >>>3/21 Zosyn 3/17 >>>3/22 levofloxacin (bilat process, atypical?)3/18>>>3/20>>resume 3/22>>  SUBJECTIVE:  Off pressors, on dilt drip. More sedated, did not diurese on lasix  VITAL SIGNS: Temp:  [99.1 F (37.3 C)-101.6 F (38.7 C)] 101.6 F (38.7 C) (03/22 0400) Pulse Rate:  [55-146] 126 (03/22 0915) Resp:  [16-35] 26 (03/22 0915) BP: (123-188)/(42-56) 188/55 mmHg (03/22 0915) SpO2:  [90 %-99 %] 95 % (03/22 0915) Arterial Line BP: (177-221)/(43-53) 192/52 mmHg (03/22 0900) FiO2 (%):  [60 %] 60 % (03/22 0915) Weight:  [102.5 kg (225 lb 15.5 oz)] 102.5 kg (225 lb 15.5 oz) (03/22 0500) HEMODYNAMICS:   Off pressors, on dilt drip VENTILATOR SETTINGS: Vent Mode:  [-] PCV FiO2 (%):  [60 %] 60 % Set Rate:  [20 bmp-35 bmp] 20 bmp Vt Set:  [440 mL] 440 mL PEEP:  [10 cmH20] 10 cmH20 Plateau Pressure:  [19 cmH20-21 cmH20] 21 cmH20 INTAKE / OUTPUT: Intake/Output     03/21 0701 - 03/22 0700 03/22  0701 - 03/23 0700   I.V. (mL/kg) 1751.4 (17.1) 95 (0.9)   NG/GT 1680 140   IV Piggyback 150 50   Total Intake(mL/kg) 3581.4 (34.9) 285 (2.8)   Urine (mL/kg/hr) 2450 (1) 250 (0.8)   Stool 1200 (0.5)    Total Output 3650 250   Net -68.6 +35          PHYSICAL EXAMINATION: General: off paralysis Neuro: rass -2 HEENT: [place]/AT. PERRL, line clean Cardiovascular:  s1 s2 RRT no m Lungs: coarse Abdomen: BS x 4, soft, NT/ND.  Ext: 1 edema Skin: no rashes.    LABS:  CBC  Recent Labs Lab 12/02/13 0400 12/03/13 0425 12/04/13 0500  WBC 19.9* 22.9* 26.9*  HGB 8.7* 10.1* 9.7*  HCT 26.6* 31.0* 30.0*  PLT 205 239 239   Coag's  Recent Labs Lab 11/29/13 1955  APTT 56*  INR 1.30   BMET  Recent Labs Lab 12/02/13 0400 12/03/13 0425 12/04/13 0500  NA 148* 152* 156*  K 3.8 4.1 3.3*  CL 109 113* 114*  CO2 25 24 27   BUN 83* 66* 81*  CREATININE 2.28* 1.64* 1.93*  GLUCOSE 176* 264* 319*   Electrolytes  Recent Labs Lab 11/30/13 1418  12/01/13 0420 12/02/13 0400 12/03/13 0425 12/04/13 0500  CALCIUM 7.9*  < > 7.7* 8.0* 8.4 8.1*  MG 2.1  --  2.0  --   --   --   PHOS 2.5  --  2.7  --   --   --   < > =  values in this interval not displayed. Sepsis Markers  Recent Labs Lab 11/29/13 1348 11/29/13 1955 11/30/13 0420 12/01/13 0420  LATICACIDVEN 3.2*  --   --   --   PROCALCITON  --  1.69 5.37 4.93   ABG  Recent Labs Lab 12/02/13 0901 12/03/13 0404 12/03/13 1950  PHART 7.366 7.393 7.395  PCO2ART 44.6 41.3 43.3  PO2ART 58.0* 53.8* 60.4*   Liver Enzymes  Recent Labs Lab 11/30/13 0154 12/01/13 0420 12/03/13 0425  AST 21 49* 102*  ALT 20 37 144*  ALKPHOS 54 47 62  BILITOT 0.3 <0.2* 0.3  ALBUMIN 2.7* 2.0* 2.1*   Cardiac Enzymes  Recent Labs Lab 11/29/13 1331 11/29/13 1955 11/30/13 0420  TROPONINI <0.30 0.39* <0.30  PROBNP  --  2322.0* 2535.0*   Glucose  Recent Labs Lab 12/03/13 1050 12/03/13 1502 12/03/13 1929 12/03/13 2331 12/04/13 0334  12/04/13 0723  GLUCAP 193* 209* 184* 186* 295* 224*    CXR: bilat interstial  infiltrates,left haziness sl improved, et twnl  No change 3/22  ASSESSMENT / PLAN:  PULMONARY A: Acute Respiratory Failure of sudden onset - etiology favor PNA, ARDS Failed ARDS vent, now on PCV, was dyssynchronous Possible HAP, ASPiration P:   Change to PCV Keep paralysis off Keep plat less 25-30 For neg balance if able,increase lasix pcxr in am   Permissive hypercapnia ok  CARDIOVASCULAR A: Chronic RBBB H/O CAD H/O HTN Septic shock resolved Afib RVR P: Cont dilt drip Continue Crestor, ASA, Clopidogrel.   RENAL A:  Acute renal failure, ATN, Cr sl higher Hypernatremia worse  P:   Sl neg I/O with more lasix kvo bmet am More  free water  GASTROINTESTINAL A:   ARDS P:   SUP: Per tube PPI Change TF to nepro  HEMATOLOGIC A:   Low clinical suspicion PE P:  DVT on hep sq now Avoid contrast, allergy and ARF  INFECTIOUS A: septic shock PNA, aspiration MSSA on cults P:   Off vanco and zosyn d/t renal issues Start levaquin with MSSA   ENDOCRINE A:  DM 2 R/o rel AI Hyperglycemia, poor control P:   SSI protocol ordered Increase antus Reduce steroids  NEUROLOGIC A:  Profound agitated delirium/acute encephalopathy Vent dyssynchrony better with fent drip Severe L hip pain - out of proportion to CT findings   ? Missed fx or prosthesis displacement ARDS P:   rass goal to -2   I have personally obtained a history, examined the patient, evaluated laboratory and imaging results, formulated the assessment and plan and placed orders. CRITICAL CARE: The patient is critically ill with multiple organ systems failure and requires high complexity decision making for assessment and support, frequent evaluation and titration of therapies, application of advanced monitoring technologies and extensive interpretation of multiple databases. Critical Care Time devoted to patient care  services described in this note is 40  minutes.   Dorcas Carrow Beeper  [phone]  Cell  [phone]  If no response or cell goes to voicemail, call beeper [phone]  [facility] Pulmonary & Critical Care

## 2013-12-04 NOTE — Progress Notes (Signed)
eLink Physician-Brief Progress Note Patient Name: Ronald HeapsHenry T Southern DOB: 10/17/1934 MRN: [id number]  Date of Service  12/04/2013   HPI/Events of Note  Hypokalemia in the setting of renal insufficiency   eICU Interventions  Plan: Potassium replaced   Intervention Category Intermediate Interventions: Electrolyte abnormality - evaluation and management  DETERDING,ELIZABETH 12/04/2013, 6:20 AM

## 2013-12-04 NOTE — Progress Notes (Signed)
Notification of Study Enrollment  This patient has been enrolled in the IRB-approved STOPPIT study, looking at avoiding PPI's when enteral feeds reach at least 30cc/hr.  Please do not place on acid suppression therapy for prophylaxis prior to contacting study team.  Shelba FlakeNathan E. Achilles Dunkope, PharmD Clinical Pharmacist - Resident Pager: [phone] Pharmacy: [phone] 12/04/2013 7:38 PM

## 2013-12-05 ENCOUNTER — Inpatient Hospital Stay (HOSPITAL_COMMUNITY): Payer: Medicare Other

## 2013-12-05 DIAGNOSIS — J1520 Pneumonia due to staphylococcus, unspecified: Secondary | ICD-10-CM

## 2013-12-05 LAB — GLUCOSE, CAPILLARY
GLUCOSE-CAPILLARY: 221 mg/dL — AB (ref 70–99)
GLUCOSE-CAPILLARY: 230 mg/dL — AB (ref 70–99)
GLUCOSE-CAPILLARY: 203 mg/dL — AB (ref 70–99)
Glucose-Capillary: 258 mg/dL — ABNORMAL HIGH (ref 70–99)
Glucose-Capillary: 254 mg/dL — ABNORMAL HIGH (ref 70–99)

## 2013-12-05 LAB — BASIC METABOLIC PANEL
BUN: 96 mg/dL — ABNORMAL HIGH (ref 6–23)
CALCIUM: 7.8 mg/dL — AB (ref 8.4–10.5)
CO2: 27 meq/L (ref 19–32)
Chloride: 117 mEq/L — ABNORMAL HIGH (ref 96–112)
Creatinine, Ser: 2.33 mg/dL — ABNORMAL HIGH (ref 0.50–1.35)
GFR calc non Af Amer: 25 mL/min — ABNORMAL LOW (ref >90)
GFR, EST AFRICAN AMERICAN: 29 mL/min — AB (ref >90)
Glucose, Bld: 330 mg/dL — ABNORMAL HIGH (ref 70–99)
Potassium: 2.6 mEq/L — CL (ref 3.7–5.3)
SODIUM: 161 meq/L — AB (ref 137–147)

## 2013-12-05 LAB — CBC WITH DIFFERENTIAL/PLATELET
Basophils Absolute: 0.1 10*3/uL (ref 0.0–0.1)
Basophils Relative: 0 % (ref 0–1)
Eosinophils Absolute: 0 10*3/uL (ref 0.0–0.7)
Eosinophils Relative: 0 % (ref 0–5)
HCT: 30.1 % — ABNORMAL LOW (ref 39.0–52.0)
Hemoglobin: 9.8 g/dL — ABNORMAL LOW (ref 13.0–17.0)
LYMPHS PCT: 7 % — AB (ref 12–46)
Lymphs Abs: 1.7 10*3/uL (ref 0.7–4.0)
MCH: 33.1 pg (ref 26.0–34.0)
MCHC: 32.6 g/dL (ref 30.0–36.0)
MCV: 101.7 fL — AB (ref 78.0–100.0)
Monocytes Absolute: 1.2 10*3/uL — ABNORMAL HIGH (ref 0.1–1.0)
Monocytes Relative: 5 % (ref 3–12)
Neutro Abs: 20.7 10*3/uL — ABNORMAL HIGH (ref 1.7–7.7)
Neutrophils Relative %: 88 % — ABNORMAL HIGH (ref 43–77)
PLATELETS: 245 10*3/uL (ref 150–400)
RBC: 2.96 MIL/uL — ABNORMAL LOW (ref 4.22–5.81)
RDW: 16.6 % — AB (ref 11.5–15.5)
WBC: 23.6 10*3/uL — AB (ref 4.0–10.5)

## 2013-12-05 LAB — CLOSTRIDIUM DIFFICILE BY PCR: CDIFFPCR: NEGATIVE

## 2013-12-05 MED ORDER — FREE WATER
400.0000 mL | Status: DC
  Administered 2013-12-05 – 2013-12-07 (×13): 400 mL

## 2013-12-05 MED ORDER — DIGOXIN 0.25 MG/ML IJ SOLN
0.5000 mg | Freq: Once | INTRAMUSCULAR | Status: AC
  Administered 2013-12-05: 0.5 mg via INTRAVENOUS
  Filled 2013-12-05: qty 2

## 2013-12-05 MED ORDER — POTASSIUM CHLORIDE 20 MEQ/15ML (10%) PO LIQD
40.0000 meq | ORAL | Status: AC
  Administered 2013-12-05 (×3): 40 meq via ORAL
  Filled 2013-12-05 (×3): qty 30

## 2013-12-05 MED ORDER — DIGOXIN 0.05 MG/ML PO SOLN
0.2500 mg | Freq: Four times a day (QID) | ORAL | Status: AC
  Administered 2013-12-05 – 2013-12-06 (×3): 0.25 mg
  Filled 2013-12-05 (×5): qty 5

## 2013-12-05 MED ORDER — VITAL HIGH PROTEIN PO LIQD
1000.0000 mL | ORAL | Status: DC
  Administered 2013-12-05 – 2013-12-09 (×5): 1000 mL
  Filled 2013-12-05 (×12): qty 1000

## 2013-12-05 MED ORDER — PNEUMOCOCCAL VAC POLYVALENT 25 MCG/0.5ML IJ INJ
0.5000 mL | INJECTION | INTRAMUSCULAR | Status: AC
  Administered 2013-12-06: 0.5 mL via INTRAMUSCULAR
  Filled 2013-12-05: qty 0.5

## 2013-12-05 MED ORDER — LEVOFLOXACIN IN D5W 750 MG/150ML IV SOLN
750.0000 mg | INTRAVENOUS | Status: DC
  Filled 2013-12-05: qty 150

## 2013-12-05 NOTE — Progress Notes (Signed)
CRITICAL VALUE ALERT  Critical value received:  Potassium 2.6  Date of notification: 12-05-13  Time of notification:  0520  Critical value read back:yes  Nurse who received alert:  Jamesetta Soarla Anjalee Cope RN  MD notified (1st page):  Levy Pupaobert Byrum   Time of first page: [phone]  Responding MD:  Levy Pupaobert Byrum  Time MD responded:  [phone]

## 2013-12-05 NOTE — Progress Notes (Signed)
NUTRITION FOLLOW UP  Intervention:    Continue 63M PEPuP Protocol: Change tube feeding to Vital High Protein with goal rate of 70 ml/h (1680 ml per day) to provide 1680 kcals (22 kcals/kg ideal weight), 147 gm protein, 1404 ml free water daily.  Vital High Protein is a peptide-based formula designed to help manage inflammation and symptoms of GI intolerance.  This will increase free water intake, which should help correct hypernatremia.  Nutrition Dx:   Inadequate oral intake related to inability to eat as evidenced by NPO status. Ongoing.  Goal:   Enteral nutrition to provide 60-70% of estimated calorie needs (22-25 kcals/kg ideal body weight) and 100% of estimated protein needs, based on ASPEN guidelines for permissive underfeeding in critically ill obese individuals. Met.  Monitor:   TF tolerance/adequacy, weight trend, labs, vent status.  Assessment:   Patient is a 79 y.o. M admitted on 3/16 for fall, suffered L hip contusion with no fx or prosthesis displacment noted on CT of hip. Developed acute onset tachypnea, tachycardia, hypoxia 3/17. Transferred to MICU and intubated on 3/17.   Patient remains intubated on ventilator support. Patient now off pressors. Weaning on vent. RN reports that patient has a lot of diarrhea. Will change TF to a semi-elemental formula (Vital High Protein). Now with hypernatremia. MV: 12.8 L/min Temp (24hrs), Avg:101.4 F (38.6 C), Min:99.6 F (37.6 C), Max:103.2 F (39.6 C)   Height: Ht Readings from Last 1 Encounters:  11/29/13 5' 10" (1.778 m)    Weight Status:   Wt Readings from Last 1 Encounters:  12/05/13 220 lb 3.8 oz (99.9 kg)  11/30/13  218 lb 14.7 oz (99.3 kg)  Re-estimated needs:  Kcal: 2330 Protein: 151 gm Fluid: 2.4 L  Skin: no wounds  Diet Order:  NPO   Intake/Output Summary (Last 24 hours) at 12/05/13 1056 Last data filed at 12/05/13 1030  Gross per 24 hour  Intake 3754.5 ml  Output   5572 ml  Net -1817.5 ml     Last BM: 3/23 (rectal tube)   Labs:   Recent Labs Lab 11/30/13 1350 11/30/13 1418  12/01/13 0420  12/03/13 0425 12/04/13 0500 12/05/13 0437  NA 138 140  < > 139  < > 152* 156* 161*  K 4.9 4.2  < > 3.9  < > 4.1 3.3* 2.6*  CL 104 105  < > 101  < > 113* 114* 117*  CO2 20 21  < > 23  < > _0 BUN 35* 36*  < > 50*  < > 66* 81* 96*  CREATININE 1.69* 1.81*  < > 1.95*  < > 1.64* 1.93* 2.33*  CALCIUM 7.7* 7.9*  < > 7.7*  < > 8.4 8.1* 7.8*  MG  --  2.1  --  2.0  --   --   --   --   PHOS  --  2.5  --  2.7  --   --   --   --   GLUCOSE 238* 206*  < > 232*  < > 264* 319* 330*  < > = values in this interval not displayed.  CBG (last 3)   Recent Labs  12/04/13 2332 12/05/13 0333 12/05/13 0751  GLUCAP 281* 258* 254*    Scheduled Meds: . albuterol  2.5 mg Inhalation Q4H  . antiseptic oral rinse  15 mL Mouth Rinse QID  . aspirin  81 mg Per Tube Daily  . chlorhexidine  15 mL Mouth Rinse BID  .  clopidogrel  75 mg Oral Q breakfast  . ezetimibe  10 mg Oral Daily  . feeding supplement (OXEPA)  1,000 mL Per Tube Q24H  . feeding supplement (PRO-STAT SUGAR FREE 64)  60 mL Per Tube QID  . free water  400 mL Per Tube Q4H  . heparin subcutaneous  5,000 Units Subcutaneous 3 times per day  . influenza vac split quadrivalent PF  0.5 mL Intramuscular Tomorrow-1000  . insulin aspart  0-15 Units Subcutaneous 6 times per day  . insulin glargine  20 Units Subcutaneous Daily  . [START ON 12/06/2013] levofloxacin (LEVAQUIN) IV  750 mg Intravenous Q48H  . pneumococcal 23 valent vaccine  0.5 mL Intramuscular Tomorrow-1000  . potassium chloride  40 mEq Oral Q4H  . rosuvastatin  20 mg Oral q1800    Continuous Infusions: . sodium chloride Stopped (12/05/13 0500)  . diltiazem (CARDIZEM) infusion 20 mg/hr (12/05/13 0900)  . fentaNYL infusion INTRAVENOUS 50 mcg/hr (12/05/13 1030)      , RD, LDN, CNSC Pager [phone] After Hours Pager [phone]     

## 2013-12-05 NOTE — Progress Notes (Addendum)
MD assessed the pt and decreased PEEP and FiO2 and placed the pt on wean. Pt currently weaning on Cp/PS 5/5 and is tolerating well at this time. RT will monitor.

## 2013-12-05 NOTE — Progress Notes (Signed)
Inpatient Diabetes Program Recommendations  AACE/ADA: New Consensus Statement on Inpatient Glycemic Control (2013)  Target Ranges:  Prepandial:   less than 140 mg/dL      Peak postprandial:   less than 180 mg/dL (1-2 hours)      Critically ill patients:  140 - 180 mg/dL   Results for Ronald Frank, Ronald Frank (MRN [id number]) as of 12/05/2013 08:47  Ref. Range 12/04/2013 07:23 12/04/2013 11:11 12/04/2013 15:18 12/04/2013 19:30 12/04/2013 23:32 12/05/2013 03:33 12/05/2013 07:51  Glucose-Capillary Latest Range: 70-99 mg/dL 409224 (H) 811261 (H) 914251 (H) 235 (H) 281 (H) 258 (H) 254 (H)   Diabetes history: DM2 Outpatient Diabetes medications: Metformin 500 mg BID Current orders for Inpatient glycemic control: Lantus 20 units daily, Novolog 0-15 units Q4H  Inpatient Diabetes Program Recommendations Insulin - Basal: Please consider increasing Lantus to 30 units daily (based on 99 kg x 0.3 units). Insulin - Meal Coverage: Please consider ordering Novolog 4 units Q4H for tube feeding coverage. Glycemic control: May want to consider discontinuing regular Glycemic control order set and order ICU Glycemic Control order set  Note: CBGs ranged from 224-281 mg/dl over the past 24 hours. Patient received a total of Novolog 40 units for correction on 12/04/13. Please consider increasing Lantus to 30 units daily and ordering Novolog 4 units Q4H for tube feeding coverage. May want to consider discontinuing regular Glycemic control order set and order ICU Glycemic Control order set to improve inpatient glycemic control.  Thanks, Orlando PennerMarie Parrish Daddario, RN, MSN, CCRN Diabetes Coordinator Inpatient Diabetes Program [phone] (Team Pager) [phone] (AP office) [phone] [facility] office)

## 2013-12-05 NOTE — Progress Notes (Signed)
eLink Physician-Brief Progress Note Patient Name: Ronald HeapsHenry T Wiswell DOB: 12/08/1934 MRN: [id number]  Date of Service  12/05/2013   HPI/Events of Note   Hypokalemia and progressive hypernatremia after aggressive diuresis on 3/22  eICU Interventions  Ordered increased free water and potassium. Lasix dosing finished.    Intervention Category Intermediate Interventions: Electrolyte abnormality - evaluation and management  Sanaz Scarlett S. 12/05/2013, 5:33 AM

## 2013-12-05 NOTE — Progress Notes (Signed)
PULMONARY / CRITICAL CARE MEDICINE   Name: Ronald Frank MRN: [id number] DOB: 13-Jan-1935    ADMISSION DATE:  11/28/2013 CONSULTATION DATE:  11/29/2013  REFERRING MD :  Benjamine Mola PRIMARY SERVICE: PCCM  CHIEF COMPLAINT:  Resp Distress  BRIEF PATIENT DESCRIPTION: 79 y.o. M admitted 3/16 for fall, suffered L hip contusion with no fx or prosthesis displacment noted on CT of hip.  Developed acute onset tachypnea, tachycardia, hypoxia 3/17.  RRT paged and PCCM called for transfer to ICU and ETT placed.  SIGNIFICANT EVENTS / STUDIES:  3/16 - admitted after mechanical fall at home. 3/17 - developed respiratory distress with RR 44, HR 130, SpO2 91%.  3/17 echo -55%, rt heart slight reduction 3/18 LE venous Dopplers: NEG  LINES / TUBES: ETT 3/17 >>  L [place] CVL 3/17 >>  R radial A-line 3/17>>   CULTURES: Blood 3/17 >> neg Urine 3/17 >> neg Resp 3/17 >> MSSA C diff 3/23 >>   ANTIBIOTICS: Ceftriaxone 3/17 x 1 Vanc 3/17 >>>3/21 Zosyn 3/17 >>>3/22 levofloxacin 3/18 >>   SUBJECTIVE:  Remains off pressors, on dilt drip. Fever. Tolerates PSV 5-10 cm H2O  VITAL SIGNS: Temp:  [99.6 F (37.6 C)-103.2 F (39.6 C)] 100.7 F (38.2 C) (03/23 1200) Pulse Rate:  [62-151] 62 (03/23 1300) Resp:  [16-25] 19 (03/23 1300) BP: (103-194)/(39-75) 142/40 mmHg (03/23 1300) SpO2:  [91 %-98 %] 92 % (03/23 1300) Arterial Line BP: (147-212)/(44-56) 172/48 mmHg (03/23 1300) FiO2 (%):  [50 %-60 %] 50 % (03/23 1200) Weight:  [99.9 kg (220 lb 3.8 oz)] 99.9 kg (220 lb 3.8 oz) (03/23 0300) HEMODYNAMICS: CVP:  [11 mmHg] 11 mmHg Off pressors, on dilt drip VENTILATOR SETTINGS: Vent Mode:  [-] PSV FiO2 (%):  [50 %-60 %] 50 % Set Rate:  [20 bmp] 20 bmp PEEP:  [5 cmH20-10 cmH20] 5 cmH20 Pressure Support:  [5 cmH20] 5 cmH20 Plateau Pressure:  [18 cmH20-20 cmH20] 20 cmH20 INTAKE / OUTPUT: Intake/Output     03/22 0701 - 03/23 0700 03/23 0701 - 03/24 0700   I.V. (mL/kg) 1212 (12.1) 275 (2.8)   NG/GT 2460 580   IV  Piggyback 200    Total Intake(mL/kg) 3872 (38.8) 855 (8.6)   Urine (mL/kg/hr) 3942 (1.6) 730 (1)   Stool 1550 (0.6)    Total Output 5492 730   Net -1620 +125          PHYSICAL EXAMINATION: General: RASS -3. MAEs Neuro: no focal deficits HEENT: WNL Cardiovascular:  IRIR, tachy, no M Lungs: clear anteriorly, no wheezes noted Abdomen: BS x 4, soft, NT/ND.  Ext: symmetric trace to 1+ edema    LABS:  I have reviewed all of today's lab results. Relevant abnormalities are discussed in the A/P section   CXR: minimal B IS prominence  ASSESSMENT / PLAN:  PULMONARY A: Acute Respiratory Failure ARD, resolved P:   Wean in PSV mode Anticipate extubation in next day or two  CARDIOVASCULAR A: Chronic RBBB H/O CAD H/O HTN Septic shock resolved Afib RVR P: Cont dilt drip Load with digoxin Control fever Continue Crestor, ASA, Clopidogrel.   RENAL A:  Acute renal failure, nonoliguric Hypernatremia P:   Hold further diuresis Increase free water Monitor BMET intermittently Monitor I/Os Correct electrolytes as indicated  GASTROINTESTINAL A:   Diarrhea P:   SUP: Per tube PPI Cont TFs  HEMATOLOGIC A:   No issues P:  DVT px: SQ hep Monitor CBC intermittently  INFECTIOUS A: severe sepsis MSSA PNA Diarrhea, leukocytosis, fever -  R/O C diff P:   Micor and abx as above  ENDOCRINE A:  DM 2 R/o rel AI Hyperglycemia, poor control P:   SSI protocol ordered Increase antus Reduce steroids  NEUROLOGIC A:  Agitated delirium/acute encephalopathy - controlled Vent dyssynchrony, improved Severe L hip pain - out of proportion to CT findings  P:   Lighten sedation RASS goal -1   I have personally obtained a history, examined the patient, evaluated laboratory and imaging results, formulated the assessment and plan and placed orders. CRITICAL CARE: The patient is critically ill with multiple organ systems failure and requires high complexity decision making  for assessment and support, frequent evaluation and titration of therapies, application of advanced monitoring technologies and extensive interpretation of multiple databases. Critical Care Time devoted to patient care services described in this note is 30  minutes.    Billy Fischeravid Shakeeta Godette, MD ; [facility] service Mobile [phone].  After 5:30 PM or weekends, call [phone]

## 2013-12-06 ENCOUNTER — Inpatient Hospital Stay (HOSPITAL_COMMUNITY): Payer: Medicare Other

## 2013-12-06 LAB — BASIC METABOLIC PANEL
BUN: 85 mg/dL — AB (ref 6–23)
BUN: 77 mg/dL — ABNORMAL HIGH (ref 6–23)
CALCIUM: 8.3 mg/dL — AB (ref 8.4–10.5)
CHLORIDE: 119 meq/L — AB (ref 96–112)
CO2: 25 mEq/L (ref 19–32)
CO2: 26 mEq/L (ref 19–32)
Calcium: 7.8 mg/dL — ABNORMAL LOW (ref 8.4–10.5)
Chloride: 122 mEq/L — ABNORMAL HIGH (ref 96–112)
Creatinine, Ser: 1.95 mg/dL — ABNORMAL HIGH (ref 0.50–1.35)
Creatinine, Ser: 1.94 mg/dL — ABNORMAL HIGH (ref 0.50–1.35)
GFR calc Af Amer: 36 mL/min — ABNORMAL LOW (ref >90)
GFR calc Af Amer: 37 mL/min — ABNORMAL LOW (ref >90)
GFR calc non Af Amer: 31 mL/min — ABNORMAL LOW (ref >90)
GFR calc non Af Amer: 32 mL/min — ABNORMAL LOW (ref >90)
GLUCOSE: 279 mg/dL — AB (ref 70–99)
GLUCOSE: 344 mg/dL — AB (ref 70–99)
Potassium: 3.7 mEq/L (ref 3.7–5.3)
Potassium: 3.5 mEq/L — ABNORMAL LOW (ref 3.7–5.3)
Sodium: 163 mEq/L (ref 137–147)
Sodium: 157 mEq/L — ABNORMAL HIGH (ref 137–147)

## 2013-12-06 LAB — POCT I-STAT 3, ART BLOOD GAS (G3+)
Acid-Base Excess: 1 mmol/L (ref 0.0–2.0)
Bicarbonate: 23.9 mEq/L (ref 20.0–24.0)
O2 Saturation: 91 %
PO2 ART: 56 mmHg — AB (ref 80.0–100.0)
Patient temperature: 99
TCO2: 25 mmol/L (ref 0–100)
pCO2 arterial: 31.1 mmHg — ABNORMAL LOW (ref 35.0–45.0)
pH, Arterial: 7.495 — ABNORMAL HIGH (ref 7.350–7.450)

## 2013-12-06 LAB — GLUCOSE, CAPILLARY
GLUCOSE-CAPILLARY: 208 mg/dL — AB (ref 70–99)
GLUCOSE-CAPILLARY: 227 mg/dL — AB (ref 70–99)
Glucose-Capillary: 237 mg/dL — ABNORMAL HIGH (ref 70–99)
Glucose-Capillary: 251 mg/dL — ABNORMAL HIGH (ref 70–99)
Glucose-Capillary: 272 mg/dL — ABNORMAL HIGH (ref 70–99)
Glucose-Capillary: 277 mg/dL — ABNORMAL HIGH (ref 70–99)

## 2013-12-06 LAB — CULTURE, BLOOD (ROUTINE X 2): CULTURE: NO GROWTH

## 2013-12-06 LAB — CBC
HCT: 30.6 % — ABNORMAL LOW (ref 39.0–52.0)
HEMOGLOBIN: 9.8 g/dL — AB (ref 13.0–17.0)
MCH: 33.3 pg (ref 26.0–34.0)
MCHC: 32 g/dL (ref 30.0–36.0)
MCV: 104.1 fL — AB (ref 78.0–100.0)
PLATELETS: 259 10*3/uL (ref 150–400)
RBC: 2.94 MIL/uL — AB (ref 4.22–5.81)
RDW: 16.6 % — ABNORMAL HIGH (ref 11.5–15.5)
WBC: 22.7 10*3/uL — ABNORMAL HIGH (ref 4.0–10.5)

## 2013-12-06 LAB — URINE MICROSCOPIC-ADD ON

## 2013-12-06 LAB — URINALYSIS, ROUTINE W REFLEX MICROSCOPIC
BILIRUBIN URINE: NEGATIVE
Glucose, UA: 100 mg/dL — AB
Ketones, ur: NEGATIVE mg/dL
LEUKOCYTES UA: NEGATIVE
NITRITE: NEGATIVE
Protein, ur: NEGATIVE mg/dL
SPECIFIC GRAVITY, URINE: 1.025 (ref 1.005–1.030)
Urobilinogen, UA: 0.2 mg/dL (ref 0.0–1.0)
pH: 5 (ref 5.0–8.0)

## 2013-12-06 LAB — HEPARIN LEVEL (UNFRACTIONATED): Heparin Unfractionated: 1.18 IU/mL — ABNORMAL HIGH (ref 0.30–0.70)

## 2013-12-06 MED ORDER — VANCOMYCIN HCL 10 G IV SOLR
1750.0000 mg | Freq: Once | INTRAVENOUS | Status: AC
  Administered 2013-12-06: 1750 mg via INTRAVENOUS
  Filled 2013-12-06: qty 1750

## 2013-12-06 MED ORDER — HEPARIN (PORCINE) IN NACL 100-0.45 UNIT/ML-% IJ SOLN
1100.0000 [IU]/h | INTRAMUSCULAR | Status: DC
  Filled 2013-12-06: qty 250

## 2013-12-06 MED ORDER — CLOPIDOGREL BISULFATE 75 MG PO TABS
75.0000 mg | ORAL_TABLET | Freq: Every day | ORAL | Status: DC
  Administered 2013-12-07 – 2013-12-08 (×2): 75 mg
  Filled 2013-12-06 (×3): qty 1

## 2013-12-06 MED ORDER — METOPROLOL TARTRATE 25 MG PO TABS
25.0000 mg | ORAL_TABLET | Freq: Two times a day (BID) | ORAL | Status: DC
  Administered 2013-12-06 (×2): 25 mg via ORAL
  Filled 2013-12-06 (×4): qty 1

## 2013-12-06 MED ORDER — VANCOMYCIN HCL 10 G IV SOLR
1250.0000 mg | INTRAVENOUS | Status: DC
  Administered 2013-12-07: 1250 mg via INTRAVENOUS
  Filled 2013-12-06 (×2): qty 1250

## 2013-12-06 MED ORDER — INSULIN ASPART 100 UNIT/ML ~~LOC~~ SOLN
0.0000 [IU] | SUBCUTANEOUS | Status: DC
  Administered 2013-12-06 (×2): 11 [IU] via SUBCUTANEOUS
  Administered 2013-12-06: 7 [IU] via SUBCUTANEOUS
  Administered 2013-12-07 (×3): 11 [IU] via SUBCUTANEOUS
  Administered 2013-12-07: 7 [IU] via SUBCUTANEOUS
  Administered 2013-12-07: 15 [IU] via SUBCUTANEOUS
  Administered 2013-12-07 – 2013-12-08 (×4): 11 [IU] via SUBCUTANEOUS
  Administered 2013-12-08 (×2): 7 [IU] via SUBCUTANEOUS
  Administered 2013-12-08 – 2013-12-09 (×3): 4 [IU] via SUBCUTANEOUS
  Administered 2013-12-09: 7 [IU] via SUBCUTANEOUS
  Administered 2013-12-09: 4 [IU] via SUBCUTANEOUS
  Administered 2013-12-09: 3 [IU] via SUBCUTANEOUS
  Administered 2013-12-10: 11 [IU] via SUBCUTANEOUS
  Administered 2013-12-10: 7 [IU] via SUBCUTANEOUS
  Administered 2013-12-10: 4 [IU] via SUBCUTANEOUS
  Administered 2013-12-10: 7 [IU] via SUBCUTANEOUS
  Administered 2013-12-10: 11 [IU] via SUBCUTANEOUS

## 2013-12-06 MED ORDER — ALBUTEROL SULFATE (2.5 MG/3ML) 0.083% IN NEBU
2.5000 mg | INHALATION_SOLUTION | Freq: Four times a day (QID) | RESPIRATORY_TRACT | Status: DC
  Administered 2013-12-06 – 2013-12-10 (×17): 2.5 mg via RESPIRATORY_TRACT
  Filled 2013-12-06 (×17): qty 3

## 2013-12-06 MED ORDER — METOPROLOL TARTRATE 1 MG/ML IV SOLN
2.5000 mg | INTRAVENOUS | Status: DC | PRN
  Administered 2013-12-07: 2.5 mg via INTRAVENOUS
  Filled 2013-12-06: qty 5

## 2013-12-06 MED ORDER — INSULIN GLARGINE 100 UNIT/ML ~~LOC~~ SOLN
30.0000 [IU] | Freq: Every day | SUBCUTANEOUS | Status: DC
  Administered 2013-12-06: 30 [IU] via SUBCUTANEOUS
  Filled 2013-12-06 (×2): qty 0.3

## 2013-12-06 MED ORDER — DEXTROSE 5 % IV SOLN
INTRAVENOUS | Status: DC
  Administered 2013-12-06: 21:00:00 via INTRAVENOUS
  Administered 2013-12-06: 125 mL via INTRAVENOUS
  Administered 2013-12-07: 04:00:00 via INTRAVENOUS
  Administered 2013-12-07: 125 mL via INTRAVENOUS

## 2013-12-06 MED ORDER — EZETIMIBE 10 MG PO TABS
10.0000 mg | ORAL_TABLET | Freq: Every day | ORAL | Status: DC
  Administered 2013-12-06 – 2013-12-10 (×5): 10 mg
  Filled 2013-12-06 (×5): qty 1

## 2013-12-06 MED ORDER — HEPARIN (PORCINE) IN NACL 100-0.45 UNIT/ML-% IJ SOLN
1400.0000 [IU]/h | INTRAMUSCULAR | Status: DC
  Administered 2013-12-06: 1400 [IU]/h via INTRAVENOUS
  Filled 2013-12-06 (×2): qty 250

## 2013-12-06 MED ORDER — FUROSEMIDE 10 MG/ML IJ SOLN
40.0000 mg | Freq: Once | INTRAMUSCULAR | Status: AC
  Administered 2013-12-06: 40 mg via INTRAVENOUS

## 2013-12-06 MED ORDER — PIPERACILLIN-TAZOBACTAM 3.375 G IVPB
3.3750 g | Freq: Three times a day (TID) | INTRAVENOUS | Status: DC
  Administered 2013-12-06 – 2013-12-09 (×10): 3.375 g via INTRAVENOUS
  Filled 2013-12-06 (×11): qty 50

## 2013-12-06 MED ORDER — ROSUVASTATIN CALCIUM 20 MG PO TABS
20.0000 mg | ORAL_TABLET | Freq: Every day | ORAL | Status: DC
  Administered 2013-12-06 – 2013-12-09 (×4): 20 mg
  Filled 2013-12-06 (×5): qty 1

## 2013-12-06 MED ORDER — ACETAMINOPHEN 160 MG/5ML PO SOLN
650.0000 mg | Freq: Three times a day (TID) | ORAL | Status: DC
  Administered 2013-12-06 – 2013-12-07 (×2): 650 mg
  Filled 2013-12-06 (×3): qty 20.3

## 2013-12-06 NOTE — Progress Notes (Signed)
ANTICOAGULATION / ANTIBIOTIC CONSULT NOTE - Initial Consult  Pharmacy Consult:  Heparin + Vancomycin / Zosyn Indication: atrial fibrillation + Empiric for persistent fever / MSSA PNA  Allergies  Allergen Reactions  . Actos [Pioglitazone Hydrochloride] Other (See Comments)    unkknown  . Erythromycin Hives and Itching  . Escitalopram Oxalate Hives and Itching  . Lipitor [Atorvastatin Calcium] Other (See Comments)    Weakness   . Omnipaque [Iohexol] Other (See Comments)    unknown  . Pletaal [Cilostazol] Other (See Comments)    unknown    Patient Measurements: Height: 5\' 10"  (177.8 cm) Weight: 216 lb 7.9 oz (98.2 kg) IBW/kg (Calculated) : 73 Heparin Dosing Weight: 93 kg  Vital Signs: Temp: 102.3 F (39.1 C) (03/24 0400) Temp src: Core (Comment) (03/24 0400) BP: 159/54 mmHg (03/24 0900) Pulse Rate: 71 (03/24 0900)  Labs:  Recent Labs  12/04/13 0500 12/05/13 0437 12/06/13 0350  HGB 9.7* 9.8* 9.8*  HCT 30.0* 30.1* 30.6*  PLT 239 245 259  CREATININE 1.93* 2.33* 1.95*    Estimated Creatinine Clearance: 37.3 ml/min (by C-G formula based on Cr of 1.95).   Medical History: Past Medical History  Diagnosis Date  . COPD (chronic obstructive pulmonary disease)   . Hyperlipidemia   . Myocardial infarction 2003  . Arthritis   . Joint pain   . Bronchitis   . Productive cough   . Anxiety   . Diabetes mellitus without complication   . Stroke   . GERD (gastroesophageal reflux disease)        Assessment: [age] YOM previously on IV heparin for rule out PE and was therapeutic on 1400 units/hr.  Now to resume for atrial fibrillation.  CBC stable; no bleeding reported.  Aware patient received heparin SQ this AM.  Patient to also broaden antibiotics to vancomycin and Zosyn for persistent fever.  Treatment for MSSA PNA continues.  He has renal dysfunction and his SCr is fluctuating.  Good UOP.  Vanc 3/17 >> 3/21, resumed 3/24 >> Zosyn 3/17 >> 3/22, resumed 3/24  >> Levaquin 3/18 >> 3/20; restarted 3/22 >> 3/24  3/17 Bld - NGTD 3/17 Urine - negative 3/17 Trach Asp - MSSA 3/23 C.diff PCR - negative  Goal of Therapy:  Heparin level 0.3-0.7 units/ml Monitor platelets by anticoagulation protocol: Yes   Plan:  - Heparin gtt at 1400 units/hr, no bolus - Check 8 hr HL - Daily HL / CBC - Vanc 1750mg  IV x 1, then 1250mg  IV Q24H - Zosyn 3.375gm IV Q8H, 4 hr infusion - Monitor renal fxn, fever curve, vanc trough as indicated - F/U CBGs   Shevaun Lovan D. Laney Potashang, PharmD, BCPS Pager:  [phone] 12/06/2013, 11:04 AM

## 2013-12-06 NOTE — Progress Notes (Signed)
Ventilator changes made by MD. Sputum sample obtained per order of MD and sample is in process of being sent to the lab. RT will continue to monitor patient.

## 2013-12-06 NOTE — Progress Notes (Signed)
Social visit.  Son called me yesterday to let me know he was in.  Course reviewed.  The a fib is new for him.  Darden PalmerW. Spencer Leather Estis, Jr. MD [facility] CenterFACC

## 2013-12-06 NOTE — Progress Notes (Signed)
ANTICOAGULATIONCONSULT NOTE -Follow Up Consult  Pharmacy Consult:  Heparin Indication: atrial fibrillation   Allergies  Allergen Reactions  . Actos [Pioglitazone Hydrochloride] Other (See Comments)    unkknown  . Erythromycin Hives and Itching  . Escitalopram Oxalate Hives and Itching  . Lipitor [Atorvastatin Calcium] Other (See Comments)    Weakness   . Omnipaque [Iohexol] Other (See Comments)    unknown  . Pletaal [Cilostazol] Other (See Comments)    unknown    Patient Measurements: Height: 5\' 10"  (177.8 cm) Weight: 216 lb 7.9 oz (98.2 kg) IBW/kg (Calculated) : 73 Heparin Dosing Weight: 93 kg  Vital Signs: Temp: 99.9 F (37.7 C) (03/24 1922) Temp src: Oral (03/24 1922) BP: 118/33 mmHg (03/24 1800) Pulse Rate: 91 (03/24 1923)  Labs:  Recent Labs  12/04/13 0500 12/05/13 0437 12/06/13 0350 12/06/13 1730 12/06/13 1832  HGB 9.7* 9.8* 9.8*  --   --   HCT 30.0* 30.1* 30.6*  --   --   PLT 239 245 259  --   --   HEPARINUNFRC  --   --   --   --  1.18*  CREATININE 1.93* 2.33* 1.95* 1.94*  --     Estimated Creatinine Clearance: 37.5 ml/min (by C-G formula based on Cr of 1.94).   Medical History: Past Medical History  Diagnosis Date  . COPD (chronic obstructive pulmonary disease)   . Hyperlipidemia   . Myocardial infarction 2003  . Arthritis   . Joint pain   . Bronchitis   . Productive cough   . Anxiety   . Diabetes mellitus without complication   . Stroke   . GERD (gastroesophageal reflux disease)        Assessment: [age] YOM previously on IV heparin for rule out PE and was therapeutic on 1400 units/hr.  Now to resume for atrial fibrillation.  CBC stable; no bleeding reported.  Aware patient received heparin SQ this AM.  Initial heparin level much higher than expected at 1.18.  Confirmed with RN sample was collected from a peripheral stick, and heparin is running in a central line.  Continues with blood-tinged secretions.  Goal of Therapy:  Heparin  level 0.3-0.7 units/ml Monitor platelets by anticoagulation protocol: Yes   Plan:  1. Hold heparin x 1 hr. 2. Then resume heparin at lower rate of 1100 units/hr. 3. Check heparin level 6 hrs after heparin resumes. 4. Daily heparin level and CBC. 5. Will monitor for additional signs of bleeding.  Tad MooreJessica Meri Pelot, Pharm D, BCPS  Clinical Pharmacist Pager [phone]  12/06/2013 8:14 PM

## 2013-12-06 NOTE — Progress Notes (Signed)
CRITICAL VALUE ALERT  Critical value received:  Na+ 163  Date of notification: 12-06-13  Time of notification:  0450  Critical value read back:yes  Nurse who received alert:  Jamesetta Soarla Paradise Vensel RN  MD notified (1st page):  E. Deterding  Time of first page:  0502  Responding MD:  E Deterding  Time MD responded:0502

## 2013-12-06 NOTE — Progress Notes (Addendum)
PULMONARY / CRITICAL CARE MEDICINE   Name: Ronald Frank MRN: [id number] DOB: 06/01/1935    ADMISSION DATE:  11/28/2013 CONSULTATION DATE:  11/29/2013  REFERRING MD :  Benjamine MolaVann PRIMARY SERVICE: PCCM  CHIEF COMPLAINT:  Resp Distress  BRIEF PATIENT DESCRIPTION: 79 y.o. M admitted 3/16 for fall, suffered L hip contusion with no fx or prosthesis displacment noted on CT of hip.  Developed acute onset tachypnea, tachycardia, hypoxia 3/17.  RRT paged and PCCM called for transfer to ICU and ETT placed.  SIGNIFICANT EVENTS / STUDIES:  3/16 admitted after mechanical fall at home. 3/16 CT L hip: no abnormalities noted 3/17 Acute respiratory distress. Transferred to ICU and intubated. ARDS protocol. Shock > vasopressors and volume resuscitation. Presumed PNA and severe sepsis/septic shock 3/17 Emergent echocardiogram: LVEF 55%, RV systolic function was mildly reduced. 3/17 CT head: chronic sinusitis. Otherwise NAD 3/18 LE venous Dopplers: NEG 3/19 Improved vent requirements 3/20 Off vasopressors 3/21 AFRVR. diltiazem infusion initiated 3/20 - 3/23 persistent, recurrent fevers 3/23 Vent mechanics much improved. Tolerated PS 5-10 cm H2O. Discussion with family about goals of care initiated 3/24  Cognition improved. Follows commands. Converted from AFRVR to flutter with 4:1 block. Enteral metoprolol initiated. Diltiazem infusion weaned from 20 to 10 mg/hr. Vent requirements increased. Mild edema pattern on CXR. Furosemide ordered. Worsening hypernatremia (Na 163). Free water increased (D5W added to enteral free water). Full dose heparin initiated  LINES / TUBES: R radial A-line 3/17>> 3/24 ETT 3/17 >>  L [place] CVL 3/17 >>    CULTURES: Blood 3/17 >> neg Urine 3/17 >> neg Resp 3/17 >> MSSA C diff 3/23 >> NEG Urine 3/24 >>  Resp 3/24 >>  Blood 3/24 >>   ANTIBIOTICS: Ceftriaxone 3/17 x 1 Vanc 3/17 >>>3/21 Pip-tazo 3/17 >>>3/22 levofloxacin 3/18 >> 3/24 Vanc 3/24 >>  Pip-tazo 3/24 >>    SUBJECTIVE:  Cognition improved. Follows commands. Converted from AFRVR to flutter with 4:1 block. Vent requirements increased. Worsening hypernatremia (Na 163).   VITAL SIGNS: Temp:  [100.2 F (37.9 C)-102.3 F (39.1 C)] 100.2 F (37.9 C) (03/24 1159) Pulse Rate:  [62-127] 88 (03/24 1300) Resp:  [17-34] 21 (03/24 1300) BP: (112-202)/(35-102) 166/49 mmHg (03/24 1300) SpO2:  [87 %-100 %] 95 % (03/24 1300) Arterial Line BP: (160-236)/(43-56) 228/47 mmHg (03/24 1300) FiO2 (%):  [50 %-100 %] 80 % (03/24 1244) Weight:  [98.2 kg (216 lb 7.9 oz)] 98.2 kg (216 lb 7.9 oz) (03/24 0351) HEMODYNAMICS: CVP:  [10 mmHg-17 mmHg] 10 mmHg  VENTILATOR SETTINGS: Vent Mode:  [-] PCV FiO2 (%):  [50 %-100 %] 80 % Set Rate:  [10 bmp-20 bmp] 10 bmp PEEP:  [5 cmH20-8 cmH20] 5 cmH20 Pressure Support:  [10 cmH20] 10 cmH20 Plateau Pressure:  [14 cmH20-18 cmH20] 14 cmH20 INTAKE / OUTPUT: Intake/Output     03/23 0701 - 03/24 0700 03/24 0701 - 03/25 0700   I.V. (mL/kg) 897.5 (9.1) 497 (5.1)   NG/GT 2430 820   IV Piggyback  525   Total Intake(mL/kg) 3327.5 (33.9) 1842 (18.8)   Urine (mL/kg/hr) 2619 (1.1) 525 (0.8)   Stool     Total Output 2619 525   Net +708.5 +1317          PHYSICAL EXAMINATION: General: RASS -1. + F/C  Neuro: no focal deficits HEENT: WNL Cardiovascular:  IRIR, tachy, no M Lungs: clear anteriorly, prolonged exp phase Abdomen: BS x 4, soft, NT/ND.  Ext: symmetric trace to 1+ edema    LABS:  I have reviewed  all of today's lab results. Relevant abnormalities are discussed in the A/P section   CXR: increased B IS prominence  ASSESSMENT / PLAN:  PULMONARY A: Acute Respiratory Failure ARD, resolved Suspect component of pulm edema Very high Ve - etiology unclear P:   Cont full vent support - settings reviewed and/or adjusted Cont vent bundle Daily SBT if/when meets criteria Lasix X 1 dose 3/24  CARDIOVASCULAR A: Septic shock resolved Chronic RBBB H/O CAD,  HTN Afib RVR Atrial flutter P: Cont dilt drip titrate to maintain HR 80-120/min Hold further digoxin Enteral metoprolol initiated 3/24 Initiate full dose UFH 3/24 Continue Crestor, ASA, Clopidogrel.   RENAL A:  Acute renal failure, nonoliguric Severe hypernatremia P:   Increase free water - D5W added 3/24 Monitor BMET intermittently Monitor I/Os Correct electrolytes as indicated  GASTROINTESTINAL A:   Diarrhea P:   SUP: enteral PPI Cont TFs Cont rectal tube  HEMATOLOGIC A:   Mild ICU acquired anemia P:  DVT px: SQ hep Monitor CBC intermittently Transfuse per usual ICU guidleins  INFECTIOUS A: severe sepsis Persistent fever, leukocytosis MSSA PNA P:   Micro and abx as above  ENDOCRINE A:  DM 2 R/o rel AI Hyperglycemia, poor control P:   SSI protocol ordered Increase Lantus  NEUROLOGIC A:  Agitated delirium/acute encephalopathy - improved, controlled Vent dyssynchrony, improved Severe L hip pain  ICU associated discomfort P:   Lighten sedation RASS goal -1   Family updated @ bedside  I have personally obtained a history, examined the patient, evaluated laboratory and imaging results, formulated the assessment and plan and placed orders. CRITICAL CARE: The patient is critically ill with multiple organ systems failure and requires high complexity decision making for assessment and support, frequent evaluation and titration of therapies, application of advanced monitoring technologies and extensive interpretation of multiple databases. Critical Care Time devoted to patient care services described in this note is 45  minutes.    Billy Fischer, MD ; [facility] [phone].  After 5:30 PM or weekends, call [phone]

## 2013-12-06 NOTE — Progress Notes (Signed)
Pt continued to have desaturation issues. A recruitment maneuver was performed at this time per MD order. Pt tolerated very well and SpO2 came up to and is maintaining at 95% at this time. RT will monitor.

## 2013-12-06 NOTE — Progress Notes (Addendum)
Changes made per current MD orders. ETT holder replaced at this time as well due to it being soiled and sliding off the pt's face. RT will monitor.

## 2013-12-07 ENCOUNTER — Inpatient Hospital Stay (HOSPITAL_COMMUNITY): Payer: Medicare Other

## 2013-12-07 LAB — CBC
HEMATOCRIT: 27.6 % — AB (ref 39.0–52.0)
Hemoglobin: 8.7 g/dL — ABNORMAL LOW (ref 13.0–17.0)
MCH: 32.8 pg (ref 26.0–34.0)
MCHC: 31.5 g/dL (ref 30.0–36.0)
MCV: 104.2 fL — AB (ref 78.0–100.0)
Platelets: 280 10*3/uL (ref 150–400)
RBC: 2.65 MIL/uL — ABNORMAL LOW (ref 4.22–5.81)
RDW: 16.4 % — AB (ref 11.5–15.5)
WBC: 21.8 10*3/uL — ABNORMAL HIGH (ref 4.0–10.5)

## 2013-12-07 LAB — GLUCOSE, CAPILLARY
GLUCOSE-CAPILLARY: 303 mg/dL — AB (ref 70–99)
GLUCOSE-CAPILLARY: 246 mg/dL — AB (ref 70–99)
GLUCOSE-CAPILLARY: 273 mg/dL — AB (ref 70–99)
Glucose-Capillary: 268 mg/dL — ABNORMAL HIGH (ref 70–99)
Glucose-Capillary: 277 mg/dL — ABNORMAL HIGH (ref 70–99)
Glucose-Capillary: 298 mg/dL — ABNORMAL HIGH (ref 70–99)

## 2013-12-07 LAB — HEPARIN LEVEL (UNFRACTIONATED)
HEPARIN UNFRACTIONATED: 0.42 [IU]/mL (ref 0.30–0.70)
Heparin Unfractionated: 1.07 IU/mL — ABNORMAL HIGH (ref 0.30–0.70)
Heparin Unfractionated: 0.68 IU/mL (ref 0.30–0.70)

## 2013-12-07 LAB — BASIC METABOLIC PANEL
BUN: 75 mg/dL — AB (ref 6–23)
CHLORIDE: 114 meq/L — AB (ref 96–112)
CO2: 25 meq/L (ref 19–32)
CREATININE: 1.93 mg/dL — AB (ref 0.50–1.35)
Calcium: 7.8 mg/dL — ABNORMAL LOW (ref 8.4–10.5)
GFR calc non Af Amer: 32 mL/min — ABNORMAL LOW (ref >90)
GFR, EST AFRICAN AMERICAN: 37 mL/min — AB (ref >90)
Glucose, Bld: 326 mg/dL — ABNORMAL HIGH (ref 70–99)
Potassium: 3.4 mEq/L — ABNORMAL LOW (ref 3.7–5.3)
Sodium: 155 mEq/L — ABNORMAL HIGH (ref 137–147)

## 2013-12-07 LAB — PROCALCITONIN: Procalcitonin: 0.37 ng/mL

## 2013-12-07 MED ORDER — SODIUM CHLORIDE 0.9 % IV SOLN
0.0000 ug/h | INTRAVENOUS | Status: DC
  Administered 2013-12-07: 50 ug/h via INTRAVENOUS
  Administered 2013-12-09: 150 ug/h via INTRAVENOUS
  Filled 2013-12-07 (×3): qty 50

## 2013-12-07 MED ORDER — METOPROLOL TARTRATE 25 MG/10 ML ORAL SUSPENSION
50.0000 mg | Freq: Two times a day (BID) | ORAL | Status: DC
  Administered 2013-12-07 – 2013-12-08 (×3): 50 mg
  Filled 2013-12-07 (×4): qty 20

## 2013-12-07 MED ORDER — INSULIN GLARGINE 100 UNIT/ML ~~LOC~~ SOLN
40.0000 [IU] | Freq: Every day | SUBCUTANEOUS | Status: DC
Start: 2013-12-07 — End: 2013-12-10
  Administered 2013-12-07 – 2013-12-09 (×3): 40 [IU] via SUBCUTANEOUS
  Filled 2013-12-07 (×4): qty 0.4

## 2013-12-07 MED ORDER — HEPARIN (PORCINE) IN NACL 100-0.45 UNIT/ML-% IJ SOLN
900.0000 [IU]/h | INTRAMUSCULAR | Status: DC
  Administered 2013-12-07: 900 [IU]/h via INTRAVENOUS
  Filled 2013-12-07: qty 250

## 2013-12-07 MED ORDER — POTASSIUM CHLORIDE 20 MEQ/15ML (10%) PO LIQD
40.0000 meq | Freq: Once | ORAL | Status: AC
  Administered 2013-12-07: 40 meq
  Filled 2013-12-07: qty 30

## 2013-12-07 MED ORDER — METOPROLOL TARTRATE 1 MG/ML IV SOLN
2.5000 mg | INTRAVENOUS | Status: DC | PRN
  Administered 2013-12-07: 5 mg via INTRAVENOUS
  Filled 2013-12-07: qty 5

## 2013-12-07 MED ORDER — FENTANYL CITRATE 0.05 MG/ML IJ SOLN
25.0000 ug | INTRAMUSCULAR | Status: DC | PRN
Start: 2013-12-07 — End: 2013-12-09
  Administered 2013-12-07 (×2): 100 ug via INTRAVENOUS
  Filled 2013-12-07 (×2): qty 2

## 2013-12-07 MED ORDER — DILTIAZEM 12 MG/ML ORAL SUSPENSION
30.0000 mg | Freq: Four times a day (QID) | ORAL | Status: DC
  Administered 2013-12-07 – 2013-12-10 (×13): 30 mg
  Filled 2013-12-07 (×16): qty 3

## 2013-12-07 MED ORDER — FREE WATER
200.0000 mL | Status: DC
  Administered 2013-12-07 – 2013-12-10 (×18): 200 mL

## 2013-12-07 MED ORDER — HEPARIN (PORCINE) IN NACL 100-0.45 UNIT/ML-% IJ SOLN
750.0000 [IU]/h | INTRAMUSCULAR | Status: DC
  Administered 2013-12-07: 750 [IU]/h via INTRAVENOUS
  Filled 2013-12-07 (×2): qty 250

## 2013-12-07 MED ORDER — FENTANYL CITRATE 0.05 MG/ML IJ SOLN
50.0000 ug | Freq: Once | INTRAMUSCULAR | Status: AC
Start: 2013-12-07 — End: 2013-12-07
  Administered 2013-12-07: 50 ug via INTRAVENOUS

## 2013-12-07 MED ORDER — DIGOXIN 0.05 MG/ML PO SOLN
0.1250 mg | Freq: Every day | ORAL | Status: DC
  Administered 2013-12-07 – 2013-12-10 (×4): 0.125 mg via ORAL
  Filled 2013-12-07 (×4): qty 2.5

## 2013-12-07 MED ORDER — FUROSEMIDE 10 MG/ML IJ SOLN
40.0000 mg | Freq: Once | INTRAMUSCULAR | Status: AC
  Administered 2013-12-07: 40 mg via INTRAVENOUS
  Filled 2013-12-07: qty 4

## 2013-12-07 MED ORDER — FENTANYL BOLUS VIA INFUSION
25.0000 ug | INTRAVENOUS | Status: DC | PRN
  Filled 2013-12-07: qty 50

## 2013-12-07 MED ORDER — POTASSIUM CHLORIDE 2 MEQ/ML IV SOLN
INTRAVENOUS | Status: DC
  Administered 2013-12-07 – 2013-12-08 (×3): via INTRAVENOUS
  Filled 2013-12-07 (×9): qty 1000

## 2013-12-07 NOTE — Progress Notes (Signed)
Results for Melvia HeapsCORBETT, Josiah T (MRN [id number]) as of 12/07/2013 13:17  Ref. Range 12/06/2013 19:13 12/06/2013 23:23 12/07/2013 03:55 12/07/2013 07:23 12/07/2013 12:22  Glucose-Capillary Latest Range: 70-99 mg/dL 782277 (H) 956268 (H) 213303 (H) 246 (H) 277 (H)   CBGs continue to be greater than 180 mg/dl.  Recommend adding Novolog 4 units every 4 hours to cover continuous tube feedings. Will continue to follow.  Smith MinceKendra Nixxon Faria RN BSN CDE

## 2013-12-07 NOTE — Progress Notes (Signed)
Patient became asynchronous with the ventilator and visibile increase work of breathing. Desaturations into the 80's tachycardic nurse notified patient had similar episode yesterday approximately same time yesterday. Oxygen increased to 80%

## 2013-12-07 NOTE — Progress Notes (Signed)
ANTICOAGULATION CONSULT NOTE - Follow Up Consult  Pharmacy Consult:  Heparin Indication: atrial fibrillation  Allergies  Allergen Reactions  . Actos [Pioglitazone Hydrochloride] Other (See Comments)    unkknown  . Erythromycin Hives and Itching  . Escitalopram Oxalate Hives and Itching  . Lipitor [Atorvastatin Calcium] Other (See Comments)    Weakness   . Omnipaque [Iohexol] Other (See Comments)    unknown  . Pletaal [Cilostazol] Other (See Comments)    unknown  Patient Measurements: Height: 5\' 10"  (177.8 cm) Weight: 223 lb 15.8 oz (101.6 kg) IBW/kg (Calculated) : 73 Heparin Dosing Weight: 93 kg Vital Signs: Temp: 100.2 F (37.9 C) (03/25 1943) Temp src: Oral (03/25 1943) BP: 109/41 mmHg (03/25 2100) Pulse Rate: 57 (03/25 2100) Labs:  Recent Labs  12/05/13 0437 12/06/13 0350 12/06/13 1730  12/07/13 0300 12/07/13 0422 12/07/13 1044 12/07/13 2030  HGB 9.8* 9.8*  --   --   --  8.7*  --   --   HCT 30.1* 30.6*  --   --   --  27.6*  --   --   PLT 245 259  --   --   --  280  --   --   HEPARINUNFRC  --   --   --   < > 1.07*  --  0.68 0.42  CREATININE 2.33* 1.95* 1.94*  --   --  1.93*  --   --   < > = values in this interval not displayed.  Estimated Creatinine Clearance: 38.3 ml/min (by C-G formula based on Cr of 1.93).  Assessment: [age] YOM with atrial fibrillation to continue on IV heparin.  Heparin level therapeutic and on low end of normal. No overt bleeding for RN this PM but still with blood-tinged secretions on suction. RN will check every 4 hours and alert us if this is worsening.   Goal of Therapy:  Heparin level 0.3-0.7 units/ml Monitor platelets by anticoagulation protocol: Yes    Plan:  - Continue heparin gtt to 750 units/hr - Daily HL / CBC  Link SnufferJessica Jennice Renegar, PharmD, BCPS Clinical Pharmacist [phone] 12/07/2013, 9:05 PM

## 2013-12-07 NOTE — Progress Notes (Signed)
eLink Physician-Brief Progress Note Patient Name: Melvia HeapsHenry T Tolliver DOB: 09/25/1934 MRN: [id number]  Date of Service  12/07/2013   HPI/Events of Note  Hypokalemia   eICU Interventions  Potassium replaced   Intervention Category Minor Interventions: Electrolytes abnormality - evaluation and management  Mykale Gandolfo 12/07/2013, 5:21 AM

## 2013-12-07 NOTE — Progress Notes (Signed)
eLink Physician-Brief Progress Note Patient Name: Melvia HeapsHenry T Fugett DOB: 04/19/1935 MRN: [id number]  Date of Service  12/07/2013   HPI/Events of Note  BC pos GPC in clusters.   eICU Interventions     Intervention Category Intermediate Interventions: Infection - evaluation and management  Shan Levansatrick Shahan Starks 12/07/2013, 8:47 PM

## 2013-12-07 NOTE — Progress Notes (Signed)
ANTICOAGULATIONCONSULT NOTE -Follow Up Consult  Pharmacy Consult:  Heparin Indication: atrial fibrillation   Allergies  Allergen Reactions  . Actos [Pioglitazone Hydrochloride] Other (See Comments)    unkknown  . Erythromycin Hives and Itching  . Escitalopram Oxalate Hives and Itching  . Lipitor [Atorvastatin Calcium] Other (See Comments)    Weakness   . Omnipaque [Iohexol] Other (See Comments)    unknown  . Pletaal [Cilostazol] Other (See Comments)    unknown    Patient Measurements: Height: 5\' 10"  (177.8 cm) Weight: 216 lb 7.9 oz (98.2 kg) IBW/kg (Calculated) : 73 Heparin Dosing Weight: 93 kg  Vital Signs: Temp: 98.8 F (37.1 C) (03/25 0008) Temp src: Oral (03/25 0008) BP: 123/34 mmHg (03/25 0315) Pulse Rate: 87 (03/25 0325)  Labs:  Recent Labs  12/04/13 0500 12/05/13 0437 12/06/13 0350 12/06/13 1730 12/06/13 1832 12/07/13 0300  HGB 9.7* 9.8* 9.8*  --   --   --   HCT 30.0* 30.1* 30.6*  --   --   --   PLT 239 245 259  --   --   --   HEPARINUNFRC  --   --   --   --  1.18* 1.07*  CREATININE 1.93* 2.33* 1.95* 1.94*  --   --     Estimated Creatinine Clearance: 37.5 ml/min (by C-G formula based on Cr of 1.94).   Medical History: Past Medical History  Diagnosis Date  . COPD (chronic obstructive pulmonary disease)   . Hyperlipidemia   . Myocardial infarction 2003  . Arthritis   . Joint pain   . Bronchitis   . Productive cough   . Anxiety   . Diabetes mellitus without complication   . Stroke   . GERD (gastroesophageal reflux disease)     Assessment: [age] YOM previously on IV heparin for rule out PE.  Heparin level remains elevated.  RN reports level was drawn appropriately.  Goal of Therapy:  Heparin level 0.3-0.7 units/ml Monitor platelets by anticoagulation protocol: Yes   Plan:  1. Hold heparin x 1 hr. 2. Then resume heparin at lower rate of 900 units/hr. 3. Check heparin level 6 hrs after heparin resumes. 4. Daily heparin level and CBC. 5.  Will monitor for additional signs of bleeding.  Ronald Frank, Pharm D Clinical Pharmacist Pager [phone]  12/07/2013 3:39 AM

## 2013-12-07 NOTE — Progress Notes (Signed)
PULMONARY / CRITICAL CARE MEDICINE   Name: Ronald Frank MRN: [id number] DOB: 20-Jun-1935    ADMISSION DATE:  11/28/2013 CONSULTATION DATE:  11/29/2013  REFERRING MD :  Benjamine Mola PRIMARY SERVICE: PCCM  CHIEF COMPLAINT:  Resp Distress  BRIEF PATIENT DESCRIPTION: 79 y.o. M admitted 3/16 for fall, suffered L hip contusion with no fx or prosthesis displacment noted on CT of hip.  Developed acute onset tachypnea, tachycardia, hypoxia 3/17.  RRT paged and PCCM called for transfer to ICU and ETT placed.  SIGNIFICANT EVENTS / STUDIES:  3/16 admitted after mechanical fall at home. 3/16 CT L hip: no abnormalities noted 3/17 Acute respiratory distress. Transferred to ICU and intubated. ARDS protocol. Shock > vasopressors and volume resuscitation. Presumed PNA and severe sepsis/septic shock 3/17 Emergent echocardiogram: LVEF 55%, RV systolic function was mildly reduced. 3/17 CT head: chronic sinusitis. Otherwise NAD 3/18 LE venous Dopplers: NEG 3/19 Improved vent requirements 3/20 Off vasopressors 3/21 AFRVR. diltiazem infusion initiated 3/20 - 3/23 persistent, recurrent fevers 3/23 Vent mechanics much improved. Tolerated PS 5-10 cm H2O. Discussion with family about goals of care initiated 3/24  Cognition improved. Follows commands. Converted from AFRVR to flutter with 4:1 block. Enteral metoprolol initiated. Diltiazem infusion weaned from 20 to 10 mg/hr. Vent requirements increased. Mild edema pattern on CXR. Furosemide ordered. Worsening hypernatremia (Na 163). Free water increased (D5W added to enteral free water). Full dose heparin initiated 3/25 CXR and gas exchange improved  LINES / TUBES: R radial A-line 3/17>> 3/24 ETT 3/17 >>  L [place] CVL 3/17 >>    CULTURES: Blood 3/17 >> neg Urine 3/17 >> neg Resp 3/17 >> MSSA C diff 3/23 >> NEG Urine 3/24 >> UA negative Resp 3/24 >> NOF Blood 3/24 >>   ANTIBIOTICS: Ceftriaxone 3/17 x 1 Vanc 3/17 >>>3/21 Pip-tazo 3/17 >>>3/22 levofloxacin 3/18  >> 3/24 Vanc 3/24 >>  Pip-tazo 3/24 >>   SUBJECTIVE:  Cognition improved further. Follows commands. Back in AFRVR but rate better controlled  VITAL SIGNS: Temp:  [98.8 F (37.1 C)-101.8 F (38.8 C)] 100.9 F (38.3 C) (03/25 1234) Pulse Rate:  [70-126] 126 (03/25 1031) Resp:  [18-31] 22 (03/25 0900) BP: (108-182)/(28-55) 166/53 mmHg (03/25 1031) SpO2:  [91 %-96 %] 93 % (03/25 0900) Arterial Line BP: (216-232)/(48-51) 216/48 mmHg (03/24 1500) FiO2 (%):  [60 %-80 %] 60 % (03/25 1236) Weight:  [101.6 kg (223 lb 15.8 oz)] 101.6 kg (223 lb 15.8 oz) (03/25 0400) HEMODYNAMICS: CVP:  [9 mmHg] 9 mmHg  VENTILATOR SETTINGS: Vent Mode:  [-] PCV FiO2 (%):  [60 %-80 %] 60 % Set Rate:  [10 bmp-20 bmp] 20 bmp PEEP:  [5 cmH20] 5 cmH20 Plateau Pressure:  [11 cmH20-17 cmH20] 15 cmH20 INTAKE / OUTPUT: Intake/Output     03/24 0701 - 03/25 0700 03/25 0701 - 03/26 0700   I.V. (mL/kg) 3310.1 (32.6) 125 (1.2)   NG/GT 3840 70   IV Piggyback 650    Total Intake(mL/kg) 7800.1 (76.8) 195 (1.9)   Urine (mL/kg/hr) 2725 (1.1) 300 (0.5)   Stool 300 (0.1)    Total Output 3025 300   Net +4775.1 -105          PHYSICAL EXAMINATION: General: RASS -1. + F/C  Neuro: no focal deficits HEENT: WNL Cardiovascular:  IRIR, tachy, no M Lungs: clear anteriorly, prolonged exp phase Abdomen: BS x 4, soft, NT/ND.  Ext: symmetric trace edema    LABS:  I have reviewed all of today's lab results. Relevant abnormalities are discussed in the  A/P section   CXR: improved B IS prominence  ASSESSMENT / PLAN:  PULMONARY A: Acute Respiratory Failure ARD, resolved Evanescent Pulm edema Very high Ve persists P:   Cont full vent support - settings reviewed and/or adjusted Cont vent bundle Daily SBT if/when meets criteria Repeat Lasix X 1 dose 3/25 If no substantial progress by Fri, will discuss trach with family  CARDIOVASCULAR A: Septic shock, resolved Chronic RBBB H/O CAD, HTN Afib RVR < > Atrial  flutter P: Transition dilt to enteral Daily digoxin Cont enteral metoprolol Cont full dose UFH Continue Crestor, ASA, Clopidogrel  RENAL A:  Acute on chronic renal insuff, nonoliguric Severe hypernatremia, improving Mild hypokalemia P:   Cont free water Monitor BMET intermittently Monitor I/Os Correct electrolytes as indicated  GASTROINTESTINAL A:   Diarrhea P:   SUP: enteral PPI Cont TFs Cont rectal tube  HEMATOLOGIC A:   Mild ICU acquired anemia P:  DVT px: SQ hep Monitor CBC intermittently Transfuse per usual ICU guidleins  INFECTIOUS A: severe sepsis Persistent fever, leukocytosis MSSA PNA, treated P:   Micro and abx as above  ENDOCRINE A:  DM 2 - inadequately controlled P:   Cont SSI protocol  Increase Lantus  NEUROLOGIC A:  Agitated delirium/acute encephalopathy - much improved Vent dyssynchrony, improved Severe L hip pain  ICU associated discomfort P:   Change PAD meds to PRN only RASS goal 0 to -1   I have personally obtained a history, examined the patient, evaluated laboratory and imaging results, formulated the assessment and plan and placed orders. CRITICAL CARE: The patient is critically ill with multiple organ systems failure and requires high complexity decision making for assessment and support, frequent evaluation and titration of therapies, application of advanced monitoring technologies and extensive interpretation of multiple databases. Critical Care Time devoted to patient care services described in this note is 35  minutes.    Billy Fischeravid Simonds, MD ; [facility] service Mobile [phone].  After 5:30 PM or weekends, call [phone]

## 2013-12-07 NOTE — Progress Notes (Signed)
ANTICOAGULATION CONSULT NOTE - Follow Up Consult  Pharmacy Consult:  Heparin Indication: atrial fibrillation  Allergies  Allergen Reactions  . Actos [Pioglitazone Hydrochloride] Other (See Comments)    unkknown  . Erythromycin Hives and Itching  . Escitalopram Oxalate Hives and Itching  . Lipitor [Atorvastatin Calcium] Other (See Comments)    Weakness   . Omnipaque [Iohexol] Other (See Comments)    unknown  . Pletaal [Cilostazol] Other (See Comments)    unknown    Patient Measurements: Height: 5\' 10"  (177.8 cm) Weight: 223 lb 15.8 oz (101.6 kg) IBW/kg (Calculated) : 73 Heparin Dosing Weight: 93 kg  Vital Signs: Temp: 101.8 F (38.8 C) (03/25 0818) Temp src: Oral (03/25 0900) BP: 166/53 mmHg (03/25 1031) Pulse Rate: 126 (03/25 1031)  Labs:  Recent Labs  12/05/13 0437 12/06/13 0350 12/06/13 1730 12/06/13 1832 12/07/13 0300 12/07/13 0422 12/07/13 1044  HGB 9.8* 9.8*  --   --   --  8.7*  --   HCT 30.1* 30.6*  --   --   --  27.6*  --   PLT 245 259  --   --   --  280  --   HEPARINUNFRC  --   --   --  1.18* 1.07*  --  0.68  CREATININE 2.33* 1.95* 1.94*  --   --  1.93*  --     Estimated Creatinine Clearance: 38.3 ml/min (by C-G formula based on Cr of 1.93).      Assessment: [age] YOM with atrial fibrillation to continue on IV heparin.  Heparin level at high end of normal.  Patient continues to have pink/blood-tinged secretions per RN.  Hemoglobin trended downward and platelets is WNL.   Goal of Therapy:  Heparin level 0.3-0.7 units/ml Monitor platelets by anticoagulation protocol: Yes    Plan:  - Decrease heparin gtt to 750 units/hr - Check 8 hr HL - Daily HL / CBC - Continue vanc 1250mg  IV Q24H and Zosyn 3.375gm IV Q8H, 4 hr infusion (abx note due 3/27) - Monitor renal fxn, fever curve, vanc trough as indicated - F/U CBGs, LFTs in AM, K+    Hillary Schwegler D. Laney Potashang, PharmD, BCPS Pager:  [phone] 12/07/2013, 11:42 AM

## 2013-12-07 NOTE — Progress Notes (Signed)
eLink Physician-Brief Progress Note Patient Name: Ronald HeapsHenry T Angelo DOB: 05/03/1935 MRN: [id number]  Date of Service  12/07/2013   HPI/Events of Note   Pt agitated on vent and HR 167  eICU Interventions  Will need continuous sedation again. Give 5mg  lopressor IV   Intervention Category Major Interventions: Delirium, psychosis, severe agitation - evaluation and management;Respiratory failure - evaluation and management  Shan Levansatrick Wright 12/07/2013, 6:02 PM

## 2013-12-08 ENCOUNTER — Inpatient Hospital Stay (HOSPITAL_COMMUNITY): Payer: Medicare Other

## 2013-12-08 LAB — GLUCOSE, CAPILLARY
GLUCOSE-CAPILLARY: 181 mg/dL — AB (ref 70–99)
GLUCOSE-CAPILLARY: 237 mg/dL — AB (ref 70–99)
GLUCOSE-CAPILLARY: 264 mg/dL — AB (ref 70–99)
Glucose-Capillary: 227 mg/dL — ABNORMAL HIGH (ref 70–99)
Glucose-Capillary: 252 mg/dL — ABNORMAL HIGH (ref 70–99)
Glucose-Capillary: 279 mg/dL — ABNORMAL HIGH (ref 70–99)

## 2013-12-08 LAB — CBC
HCT: 27.8 % — ABNORMAL LOW (ref 39.0–52.0)
Hemoglobin: 8.9 g/dL — ABNORMAL LOW (ref 13.0–17.0)
MCH: 32.6 pg (ref 26.0–34.0)
MCHC: 32 g/dL (ref 30.0–36.0)
MCV: 101.8 fL — ABNORMAL HIGH (ref 78.0–100.0)
Platelets: 343 10*3/uL (ref 150–400)
RBC: 2.73 MIL/uL — ABNORMAL LOW (ref 4.22–5.81)
RDW: 16.1 % — AB (ref 11.5–15.5)
WBC: 25.9 10*3/uL — ABNORMAL HIGH (ref 4.0–10.5)

## 2013-12-08 LAB — COMPREHENSIVE METABOLIC PANEL
ALBUMIN: 1.6 g/dL — AB (ref 3.5–5.2)
ALT: 90 U/L — ABNORMAL HIGH (ref 0–53)
AST: 89 U/L — ABNORMAL HIGH (ref 0–37)
Alkaline Phosphatase: 47 U/L (ref 39–117)
BUN: 63 mg/dL — ABNORMAL HIGH (ref 6–23)
CALCIUM: 7.8 mg/dL — AB (ref 8.4–10.5)
CO2: 25 meq/L (ref 19–32)
CREATININE: 1.57 mg/dL — AB (ref 0.50–1.35)
Chloride: 115 mEq/L — ABNORMAL HIGH (ref 96–112)
GFR calc Af Amer: 47 mL/min — ABNORMAL LOW (ref >90)
GFR, EST NON AFRICAN AMERICAN: 41 mL/min — AB (ref >90)
Glucose, Bld: 206 mg/dL — ABNORMAL HIGH (ref 70–99)
Potassium: 3.9 mEq/L (ref 3.7–5.3)
Sodium: 153 mEq/L — ABNORMAL HIGH (ref 137–147)
Total Bilirubin: 0.3 mg/dL (ref 0.3–1.2)
Total Protein: 5.2 g/dL — ABNORMAL LOW (ref 6.0–8.3)

## 2013-12-08 LAB — CULTURE, RESPIRATORY W GRAM STAIN

## 2013-12-08 LAB — CULTURE, BLOOD (ROUTINE X 2)

## 2013-12-08 LAB — CULTURE, RESPIRATORY

## 2013-12-08 LAB — HEPARIN LEVEL (UNFRACTIONATED)
HEPARIN UNFRACTIONATED: 0.26 [IU]/mL — AB (ref 0.30–0.70)
Heparin Unfractionated: <0.1 IU/mL — ABNORMAL LOW (ref 0.30–0.70)

## 2013-12-08 MED ORDER — FUROSEMIDE 10 MG/ML IJ SOLN
40.0000 mg | Freq: Once | INTRAMUSCULAR | Status: AC
  Administered 2013-12-08: 40 mg via INTRAVENOUS
  Filled 2013-12-08: qty 4

## 2013-12-08 MED ORDER — VANCOMYCIN HCL IN DEXTROSE 750-5 MG/150ML-% IV SOLN
750.0000 mg | Freq: Two times a day (BID) | INTRAVENOUS | Status: DC
  Administered 2013-12-08 – 2013-12-09 (×2): 750 mg via INTRAVENOUS
  Filled 2013-12-08 (×3): qty 150

## 2013-12-08 MED ORDER — PROPOFOL 10 MG/ML IV EMUL
5.0000 ug/kg/min | Freq: Once | INTRAVENOUS | Status: AC
Start: 2013-12-08 — End: 2013-12-09
  Administered 2013-12-09: 15 ug/kg/min via INTRAVENOUS
  Filled 2013-12-08: qty 100

## 2013-12-08 MED ORDER — METOPROLOL TARTRATE 25 MG/10 ML ORAL SUSPENSION
50.0000 mg | Freq: Three times a day (TID) | ORAL | Status: DC
  Administered 2013-12-08 – 2013-12-10 (×6): 50 mg
  Filled 2013-12-08 (×8): qty 20

## 2013-12-08 MED ORDER — FENTANYL CITRATE 0.05 MG/ML IJ SOLN: 200.0000 ug | Freq: Once | INTRAMUSCULAR | Status: DC

## 2013-12-08 MED ORDER — SODIUM CHLORIDE 0.9 % IJ SOLN: 10.0000 mL | INTRAMUSCULAR | Status: DC | PRN

## 2013-12-08 MED ORDER — ETOMIDATE 2 MG/ML IV SOLN
40.0000 mg | Freq: Once | INTRAVENOUS | Status: DC
  Filled 2013-12-08: qty 20

## 2013-12-08 MED ORDER — SODIUM CHLORIDE 0.9 % IJ SOLN
10.0000 mL | Freq: Two times a day (BID) | INTRAMUSCULAR | Status: DC
  Administered 2013-12-09 – 2013-12-10 (×4): 10 mL

## 2013-12-08 MED ORDER — MIDAZOLAM HCL 2 MG/2ML IJ SOLN: 4.0000 mg | Freq: Once | INTRAMUSCULAR | Status: DC

## 2013-12-08 MED ORDER — HEPARIN (PORCINE) IN NACL 100-0.45 UNIT/ML-% IJ SOLN
1100.0000 [IU]/h | INTRAMUSCULAR | Status: AC
  Administered 2013-12-08: 800 [IU]/h via INTRAVENOUS

## 2013-12-08 MED ORDER — VECURONIUM BROMIDE 10 MG IV SOLR
10.0000 mg | Freq: Once | INTRAVENOUS | Status: DC
  Administered 2013-12-09: 10 mg via INTRAVENOUS
  Filled 2013-12-08: qty 10

## 2013-12-08 NOTE — Progress Notes (Signed)
ANTICOAGULATION CONSULT NOTE - Follow Up Consult  Pharmacy Consult:  Heparin Indication: atrial fibrillation  Allergies  Allergen Reactions  . Actos [Pioglitazone Hydrochloride] Other (See Comments)    unkknown  . Erythromycin Hives and Itching  . Escitalopram Oxalate Hives and Itching  . Lipitor [Atorvastatin Calcium] Other (See Comments)    Weakness   . Omnipaque [Iohexol] Other (See Comments)    unknown  . Pletaal [Cilostazol] Other (See Comments)    unknown    Patient Measurements: Height: 5\' 10"  (177.8 cm) Weight: 223 lb 15.8 oz (101.6 kg) IBW/kg (Calculated) : 73 Heparin Dosing Weight: 93 kg  Vital Signs: Temp: 97.6 F (36.4 C) (03/26 1550) Temp src: Oral (03/26 1550) BP: 149/53 mmHg (03/26 1700) Pulse Rate: 109 (03/26 1700)  Labs:  Recent Labs  12/06/13 0350 12/06/13 1730  12/07/13 0422  12/07/13 2030 12/08/13 0455 12/08/13 1630  HGB 9.8*  --   --  8.7*  --   --  8.9*  --   HCT 30.6*  --   --  27.6*  --   --  27.8*  --   PLT 259  --   --  280  --   --  343  --   HEPARINUNFRC  --   --   < >  --   < > 0.42 0.26* <0.10*  CREATININE 1.95* 1.94*  --  1.93*  --   --  1.57*  --   < > = values in this interval not displayed.  Estimated Creatinine Clearance: 47 ml/min (by C-G formula based on Cr of 1.57).  Assessment: [age] YOM with atrial fibrillation on IV heparin.  Heparin level now down to undetectable. No line issues noted per RN.  Patient continues to have pink/blood-tinged secretions per RN but no other bleeding noted.  Goal of Therapy:  Heparin level 0.3-0.7 units/ml Monitor platelets by anticoagulation protocol: Yes    Plan:  - Increase heparin gtt to 1100 units/hr. Noted heparin to be turned off 3/27 at 0600 for trach. - Check 8 hr HL - Daily HL / CBC  Christoper Fabianaron Miquan Tandon, PharmD, BCPS Clinical pharmacist, pager [phone] 12/08/2013, 5:34 PM

## 2013-12-08 NOTE — Progress Notes (Signed)
Inpatient Diabetes Program Recommendations  AACE/ADA: New Consensus Statement on Inpatient Glycemic Control (2013)  Target Ranges:  Prepandial:   less than 140 mg/dL      Peak postprandial:   less than 180 mg/dL (1-2 hours)      Critically ill patients:  140 - 180 mg/dL   Results for Ronald HeapsCORBETT, Tell T (MRN [id number]) as of 12/08/2013 08:56  Ref. Range 12/07/2013 07:23 12/07/2013 12:22 12/07/2013 15:18 12/07/2013 19:05 12/07/2013 23:51 12/08/2013 04:21 12/08/2013 08:23  Glucose-Capillary Latest Range: 70-99 mg/dL 409246 (H) 811277 (H) 914298 (H) 273 (H) 252 (H) 181 (H) 237 (H)   Diabetes history: DM2  Outpatient Diabetes medications: Metformin 500 mg BID  Current orders for Inpatient glycemic control: Lantus 40 units daily, Novolog 0-20 units Q4H  Inpatient Diabetes Program Recommendations Inpatient Glycemic Control Order Set: Please consider ordering ICU Glycemic Control order set instead of currently regular glycemic control order set since blood glucose has consistently been greater than 200 mg/dl despite increase in basal and Novolog correction scale. Insulin - Basal: Please consider increasing Lantus to 45 units daily. Insulin - Meal Coverage: Please order Novolog 5 untis Q4H for tube feeding coverage.  Thanks, Orlando PennerMarie Traeh Milroy, RN, MSN, CCRN Diabetes Coordinator Inpatient Diabetes Program [phone] (Team [phone] ([facility] office) [phone] [facility] office)

## 2013-12-08 NOTE — Progress Notes (Signed)
NUTRITION FOLLOW UP  Intervention:    Continue 59M PEPuP Protocol: Vital High Protein at goal rate of 70 ml/h (1680 ml per day) to provide 1680 kcals (22 kcals/kg ideal weight), 147 gm protein, 1404 ml free water daily.  Nutrition Dx:   Inadequate oral intake related to inability to eat as evidenced by NPO status. Ongoing.  Goal:   Enteral nutrition to provide 60-70% of estimated calorie needs (22-25 kcals/kg ideal body weight) and 100% of estimated protein needs, based on ASPEN guidelines for permissive underfeeding in critically ill obese individuals. Met.  Monitor:   TF tolerance/adequacy, weight trend, labs, vent status.  Assessment:   Patient is a 79 y.o. M admitted on 3/16 for fall, suffered L hip contusion with no fx or prosthesis displacment noted on CT of hip. Developed acute onset tachypnea, tachycardia, hypoxia 3/17. Transferred to MICU and intubated on 3/17.   Discussed patient in ICU rounds today. Patient is tolerating TF well. Currently receiving Vital High Protein via OGT at goal rate of 70 ml/h (1680 ml per day) to provide 1680 kcals (22 kcals/kg ideal weight), 147 gm protein, 1404 ml free water daily.  Patient remains intubated on ventilator support.  MV: 16.8 L/min Temp (24hrs), Avg:99.5 F (37.5 C), Min:97.3 F (36.3 C), Max:100.9 F (38.3 C)   Height: Ht Readings from Last 1 Encounters:  11/29/13 $RemoveB'5\' 10"'SKDVaWyh$  (1.778 m)    Weight Status:   Wt Readings from Last 1 Encounters:  12/07/13 223 lb 15.8 oz (101.6 kg)  12/05/13  220 lb 3.8 oz (99.9 kg)  11/30/13  218 lb 14.7 oz (99.3 kg)  Re-estimated needs:  Kcal: 2475 Protein: 151 gm Fluid: 2.5 L  Skin: no wounds  Diet Order:  NPO   Intake/Output Summary (Last 24 hours) at 12/08/13 1224 Last data filed at 12/08/13 1200  Gross per 24 hour  Intake 4752.65 ml  Output   3950 ml  Net 802.65 ml    Last BM: 3/25 (rectal tube)   Labs:   Recent Labs Lab 12/06/13 1730 12/07/13 0422 12/08/13 0455  NA  157* 155* 153*  K 3.5* 3.4* 3.9  CL 119* 114* 115*  CO2 $Re'26 25 25  'Okz$ BUN 77* 75* 63*  CREATININE 1.94* 1.93* 1.57*  CALCIUM 7.8* 7.8* 7.8*  GLUCOSE 344* 326* 206*    CBG (last 3)   Recent Labs  12/08/13 0421 12/08/13 0823 12/08/13 1136  GLUCAP 181* 237* 264*    Scheduled Meds: . albuterol  2.5 mg Inhalation Q6H  . antiseptic oral rinse  15 mL Mouth Rinse QID  . aspirin  81 mg Per Tube Daily  . chlorhexidine  15 mL Mouth Rinse BID  . clopidogrel  75 mg Per Tube Q breakfast  . digoxin  0.125 mg Oral Daily  . diltiazem  30 mg Per Tube 4 times per day  . ezetimibe  10 mg Per Tube Daily  . free water  200 mL Per Tube Q4H  . insulin aspart  0-20 Units Subcutaneous 6 times per day  . insulin glargine  40 Units Subcutaneous Daily  . metoprolol tartrate  50 mg Per Tube BID  . piperacillin-tazobactam (ZOSYN)  IV  3.375 g Intravenous Q8H  . rosuvastatin  20 mg Per Tube q1800  . vancomycin  750 mg Intravenous Q12H    Continuous Infusions: . sodium chloride Stopped (12/06/13 0000)  . dextrose 5 % with kcl 100 mL/hr at 12/07/13 2037  . feeding supplement (VITAL HIGH PROTEIN) 1,000 mL (12/08/13  3212)  . fentaNYL infusion INTRAVENOUS 100 mcg/hr (12/08/13 0900)  . heparin 800 Units/hr (12/08/13 1200)     Molli Barrows, RD, LDN, [place] Pager [phone] After Hours Pager [phone]

## 2013-12-08 NOTE — Progress Notes (Signed)
ANTICOAGULATION / ANTIBIOTIC CONSULT NOTE - Follow Up Consult  Pharmacy Consult:  Heparin + Vancomycin Indication: atrial fibrillation + empiric for fever / GPC bacteremia  Allergies  Allergen Reactions  . Actos [Pioglitazone Hydrochloride] Other (See Comments)    unkknown  . Erythromycin Hives and Itching  . Escitalopram Oxalate Hives and Itching  . Lipitor [Atorvastatin Calcium] Other (See Comments)    Weakness   . Omnipaque [Iohexol] Other (See Comments)    unknown  . Pletaal [Cilostazol] Other (See Comments)    unknown    Patient Measurements: Height: 5\' 10"  (177.8 cm) Weight: 223 lb 15.8 oz (101.6 kg) IBW/kg (Calculated) : 73 Heparin Dosing Weight: 93 kg  Vital Signs: Temp: 99.1 F (37.3 C) (03/26 0401) Temp src: Oral (03/26 0401) BP: 128/39 mmHg (03/26 0600) Pulse Rate: 88 (03/26 0600)  Labs:  Recent Labs  12/06/13 0350 12/06/13 1730  12/07/13 0422 12/07/13 1044 12/07/13 2030 12/08/13 0455  HGB 9.8*  --   --  8.7*  --   --  8.9*  HCT 30.6*  --   --  27.6*  --   --  27.8*  PLT 259  --   --  280  --   --  343  HEPARINUNFRC  --   --   < >  --  0.68 0.42 0.26*  CREATININE 1.95* 1.94*  --  1.93*  --   --  1.57*  < > = values in this interval not displayed.  Estimated Creatinine Clearance: 47 ml/min (by C-G formula based on Cr of 1.57).      Assessment: [age] YOM with atrial fibrillation to continue on IV heparin.  Heparin level now slightly sub-therapeutic.  Patient continues to have pink/blood-tinged secretions per RN.  Patient continues on vancomycin and Zosyn for GPC bacteremia and as empiric therapy for persistent fever.  His renal function is improving.  Patient's weight has trended up, partly due to volume status.  Vanc 3/17 >> 3/21, resumed 3/24 >> Zosyn 3/17 >> 3/22, resumed 3/24 >> Levaquin 3/18 >> 3/20; restarted 3/22 >> 3/24  3/17 Bld - negative 3/17 Urine - negative 3/17 Trach Asp - MSSA 3/23 C.diff PCR - negative 3/24 resp cx -  pending 3/24 BCx - GPC   Goal of Therapy:  Heparin level 0.3-0.7 units/ml Monitor platelets by anticoagulation protocol: Yes Vanc trough 15-20 mcg/mL    Plan:  - Increase heparin gtt to 800 units/hr - Check 8 hr HL - Daily HL / CBC - Change vanc to 750mg  IV Q12H - Continue Zosyn 3.375gm IV Q8H, 4 hr infusion, ?discontinue - Monitor renal fxn, fever curve, vanc trough prior to 4th dose    Ronald Frank, PharmD, BCPS Pager:  [phone] 12/08/2013, 7:18 AM

## 2013-12-08 NOTE — Progress Notes (Signed)
Called to inform pharmacy that patient is now having more blood tinged sputum- concerns raised earlier due to heparin gtts. Informed that he is still on the low side of normal and that we will look at labs in the AM and adjust accordingly.

## 2013-12-08 NOTE — Progress Notes (Signed)
PULMONARY / CRITICAL CARE MEDICINE   Name: Ronald Frank MRN: [id number] DOB: 10/17/1934    ADMISSION DATE:  11/28/2013 CONSULTATION DATE:  11/29/2013  REFERRING MD :  Benjamine MolaVann PRIMARY SERVICE: PCCM  CHIEF COMPLAINT:  Resp Distress  BRIEF PATIENT DESCRIPTION: 79 y.o. M admitted 3/16 for fall, suffered L hip contusion with no fx or prosthesis displacment noted on CT of hip.  Developed acute onset tachypnea, tachycardia, hypoxia 3/17.  RRT paged and PCCM called for transfer to ICU and ETT placed.  SIGNIFICANT EVENTS / STUDIES:  3/16 admitted after mechanical fall at home. 3/16 CT L hip: no abnormalities noted 3/17 Acute respiratory distress. Transferred to ICU and intubated. ARDS protocol. Shock > vasopressors and volume resuscitation. Presumed PNA and severe sepsis/septic shock 3/17 Emergent echocardiogram: LVEF 55%, RV systolic function was mildly reduced. 3/17 CT head: chronic sinusitis. Otherwise NAD 3/18 LE venous Dopplers: NEG 3/19 Improved vent requirements 3/20 Off vasopressors 3/21 AFRVR. diltiazem infusion initiated 3/20 - 3/23 persistent, recurrent fevers 3/23 Vent mechanics much improved. Tolerated PS 5-10 cm H2O. Discussion with family about goals of care initiated 3/24  Cognition improved. Follows commands. Converted from AFRVR to flutter with 4:1 block. Enteral metoprolol initiated. Diltiazem infusion weaned from 20 to 10 mg/hr. Vent requirements increased. Mild edema pattern on CXR. Furosemide ordered. Worsening hypernatremia (Na 163). Free water increased (D5W added to enteral free water). Full dose heparin initiated 3/25 CXR and gas exchange improved 3/26 Agitation requiring resumption of continuous sedative/analgesics  LINES / TUBES: R radial A-line 3/17>> 3/24 L [place] CVL 3/17 >> 3/27 (ordered) ETT 3/17 >>  PICC 3/27 (ordered) >>    CULTURES: Blood 3/17 >> neg Urine 3/17 >> neg Resp 3/17 >> MSSA C diff 3/23 >> NEG Urine 3/24 >> UA negative Resp 3/24 >> NOF Blood  3/24 >> 1/2 coag neg staph (likely contaminant)  ANTIBIOTICS: Ceftriaxone 3/17 x 1 Vanc 3/17 >>>3/21 Pip-tazo 3/17 >>>3/22 levofloxacin 3/18 >> 3/24 Vanc 3/24 >>  Pip-tazo 3/24 >>   SUBJECTIVE:  Episodic agitation. + F/C intermittently  VITAL SIGNS: Temp:  [97.3 F (36.3 C)-100.2 F (37.9 C)] 99.5 F (37.5 C) (03/26 1222) Pulse Rate:  [42-152] 112 (03/26 1400) Resp:  [20-46] 20 (03/26 1400) BP: (102-172)/(32-90) 110/32 mmHg (03/26 1400) SpO2:  [90 %-100 %] 92 % (03/26 1400) FiO2 (%):  [50 %-80 %] 50 % (03/26 1400) HEMODYNAMICS: CVP:  [10 mmHg-11 mmHg] 10 mmHg  VENTILATOR SETTINGS: Vent Mode:  [-] PCV FiO2 (%):  [50 %-80 %] 50 % Set Rate:  [20 bmp] 20 bmp PEEP:  [5 cmH20] 5 cmH20 Pressure Support:  [10 cmH20] 10 cmH20 Plateau Pressure:  [12 cmH20-16 cmH20] 12 cmH20 INTAKE / OUTPUT: Intake/Output     03/25 0701 - 03/26 0700 03/26 0701 - 03/27 0700   I.V. (mL/kg) 2443.5 (24.1) 736 (7.2)   NG/GT 1680 890   IV Piggyback 400 162.5   Total Intake(mL/kg) 4523.5 (44.5) 1788.5 (17.6)   Urine (mL/kg/hr) 3575 (1.5) 650 (0.9)   Stool 800 (0.3) 300 (0.4)   Total Output 4375 950   Net +148.5 +838.5          PHYSICAL EXAMINATION: General: RASS +1. + F/C  Neuro: no focal deficits HEENT: WNL Cardiovascular:  IRIR, tachy, no M Lungs: clear anteriorly, prolonged exp phase Abdomen: BS x 4, soft, NT/ND.  Ext: symmetric trace edema    LABS:  I have reviewed all of today's lab results. Relevant abnormalities are discussed in the A/P section   CXR:  IS edema and small L effusion  ASSESSMENT / PLAN:  PULMONARY A: Acute Respiratory Failure ARD, resolved Pulm edema Very high Ve persists Small L efusion P:   Cont full vent support - settings reviewed and/or adjusted Cont vent bundle Daily SBT if/when meets criteria Repeat Lasix X 1 dose 3/26 Plan trach tube placement 3/27  CARDIOVASCULAR A: Septic shock, resolved Chronic RBBB H/O CAD, HTN Afib RVR < > Atrial  flutter P: Cont enteral diltiazem Cont daily digoxin Increase enteral metoprolol Cont full dose UFH - hold for procedure 3/27. Then consider transition to warfarin if no complications Continue Crestor, ASA, Clopidogrel  RENAL A:  Acute on chronic renal insuff, nonoliguric - improving Severe hypernatremia, improving Mild hypokalemia, resolved Hypervolemia P:   Cont free water - D5W rate adjusted Monitor BMET intermittently Monitor I/Os Correct electrolytes as indicated  GASTROINTESTINAL A:   Diarrhea, C diff negative P:   SUP: enteral PPI Cont TFs Cont rectal tube  HEMATOLOGIC A:   Mild ICU acquired anemia P:  DVT px: full dose UFH Monitor CBC intermittently Transfuse per usual ICU guidelines  INFECTIOUS A: severe sepsis Persistent fever, leukocytosis - imroved MSSA PNA, treated Positive blood cx - likely contaminant but has 10d old CVL P:   Micro and abx as above PICC ordered and DC CVL  ENDOCRINE A:  DM 2 - inadequately controlled P:   Cont SSI protocol  Consider increase Lantus after trach tube placed  NEUROLOGIC A:  Agitated delirium/acute encephalopathy Vent dyssynchrony, improved Severe L hip pain  ICU associated discomfort P:   Cont fent infusion until after trach tube placed RASS goal 0 to -1  Family updated in detail and I discussed trach option with them encouraging that it would be reasonable and appropriate to proceed with such. Planned for Fri 3/27. Will then plan on transfer to SDU and seek LTACH or rehab depending on course of illness   I have personally obtained a history, examined the patient, evaluated laboratory and imaging results, formulated the assessment and plan and placed orders. CRITICAL CARE: The patient is critically ill with multiple organ systems failure and requires high complexity decision making for assessment and support, frequent evaluation and titration of therapies, application of advanced monitoring technologies  and extensive interpretation of multiple databases. Critical Care Time devoted to patient care services described in this note is 45  minutes.    Billy Fischer, MD ; [facility] [phone].  After 5:30 PM or weekends, call [phone]

## 2013-12-08 NOTE — Progress Notes (Signed)
Peripherally Inserted Central Catheter/Midline Placement  The IV Nurse has discussed with the patient and/or persons authorized to consent for the patient, the purpose of this procedure and the potential benefits and risks involved with this procedure.  The benefits include less needle sticks, lab draws from the catheter and patient may be discharged home with the catheter.  Risks include, but not limited to, infection, bleeding, blood clot (thrombus formation), and puncture of an artery; nerve damage and irregular heat beat.  Alternatives to this procedure were also discussed.  Telephone consent obtained from son due to patient sedated on ventilator.   PICC/Midline Placement Documentation  PICC Triple Lumen 12/08/13 PICC Left Basilic 45 cm 0 cm (Active)  Indication for Insertion or Continuance of Line Limited venous access - need for IV therapy >5 days (PICC only);Poor Vasculature-patient has had multiple peripheral attempts or PIVs lasting less than 24 hours 12/08/2013  9:15 PM  Exposed Catheter (cm) 0 cm 12/08/2013  9:15 PM  Site Assessment Clean;Dry;Intact 12/08/2013  9:15 PM  Lumen #1 Status Flushed;Saline locked;Blood return noted 12/08/2013  9:15 PM  Lumen #2 Status Flushed;Saline locked;Blood return noted 12/08/2013  9:15 PM  Lumen #3 Status Flushed;Saline locked;Blood return noted 12/08/2013  9:15 PM  Dressing Type Transparent 12/08/2013  9:15 PM  Dressing Status Clean;Dry;Intact;Antimicrobial disc in place 12/08/2013  9:15 PM  Dressing Change Due 12/15/13 12/08/2013  9:15 PM       Amiir Heckard, Lajean ManesKerry Loraine 12/08/2013, 9:27 PM

## 2013-12-08 NOTE — Progress Notes (Signed)
UR Completed.  Jasalyn Frysinger Jane [phone] 12/08/2013  

## 2013-12-09 ENCOUNTER — Inpatient Hospital Stay (HOSPITAL_COMMUNITY): Payer: Medicare Other

## 2013-12-09 DIAGNOSIS — Z9911 Dependence on respirator [ventilator] status: Secondary | ICD-10-CM

## 2013-12-09 DIAGNOSIS — D72829 Elevated white blood cell count, unspecified: Secondary | ICD-10-CM

## 2013-12-09 DIAGNOSIS — Z930 Tracheostomy status: Secondary | ICD-10-CM

## 2013-12-09 LAB — CBC
HCT: 24.9 % — ABNORMAL LOW (ref 39.0–52.0)
Hemoglobin: 8.1 g/dL — ABNORMAL LOW (ref 13.0–17.0)
MCH: 33.6 pg (ref 26.0–34.0)
MCHC: 32.5 g/dL (ref 30.0–36.0)
MCV: 103.3 fL — ABNORMAL HIGH (ref 78.0–100.0)
PLATELETS: 347 10*3/uL (ref 150–400)
RBC: 2.41 MIL/uL — ABNORMAL LOW (ref 4.22–5.81)
RDW: 15.6 % — AB (ref 11.5–15.5)
WBC: 23 10*3/uL — AB (ref 4.0–10.5)

## 2013-12-09 LAB — BASIC METABOLIC PANEL
BUN: 66 mg/dL — ABNORMAL HIGH (ref 6–23)
CALCIUM: 7.8 mg/dL — AB (ref 8.4–10.5)
CO2: 25 mEq/L (ref 19–32)
CREATININE: 1.73 mg/dL — AB (ref 0.50–1.35)
Chloride: 111 mEq/L (ref 96–112)
GFR, EST AFRICAN AMERICAN: 42 mL/min — AB (ref >90)
GFR, EST NON AFRICAN AMERICAN: 36 mL/min — AB (ref >90)
Glucose, Bld: 157 mg/dL — ABNORMAL HIGH (ref 70–99)
Potassium: 4.2 mEq/L (ref 3.7–5.3)
Sodium: 150 mEq/L — ABNORMAL HIGH (ref 137–147)

## 2013-12-09 LAB — HEPARIN LEVEL (UNFRACTIONATED): HEPARIN UNFRACTIONATED: 0.36 [IU]/mL (ref 0.30–0.70)

## 2013-12-09 LAB — GLUCOSE, CAPILLARY
GLUCOSE-CAPILLARY: 155 mg/dL — AB (ref 70–99)
GLUCOSE-CAPILLARY: 177 mg/dL — AB (ref 70–99)
Glucose-Capillary: 236 mg/dL — ABNORMAL HIGH (ref 70–99)
Glucose-Capillary: 157 mg/dL — ABNORMAL HIGH (ref 70–99)
Glucose-Capillary: 131 mg/dL — ABNORMAL HIGH (ref 70–99)
Glucose-Capillary: 172 mg/dL — ABNORMAL HIGH (ref 70–99)

## 2013-12-09 LAB — PROTIME-INR
INR: 1.22 (ref 0.00–1.49)
PROTHROMBIN TIME: 15.1 s (ref 11.6–15.2)

## 2013-12-09 LAB — APTT: APTT: 91 s — AB (ref 24–37)

## 2013-12-09 MED ORDER — FENTANYL CITRATE 0.05 MG/ML IJ SOLN
25.0000 ug | INTRAMUSCULAR | Status: DC | PRN
  Administered 2013-12-10: 100 ug via INTRAVENOUS
  Administered 2013-12-10: 50 ug via INTRAVENOUS
  Filled 2013-12-09 (×2): qty 2

## 2013-12-09 MED ORDER — HALOPERIDOL LACTATE 5 MG/ML IJ SOLN
5.0000 mg | Freq: Once | INTRAMUSCULAR | Status: AC
  Administered 2013-12-09: 5 mg via INTRAVENOUS
  Filled 2013-12-09: qty 1

## 2013-12-09 MED ORDER — HEPARIN (PORCINE) IN NACL 100-0.45 UNIT/ML-% IJ SOLN
1100.0000 [IU]/h | INTRAMUSCULAR | Status: DC
  Administered 2013-12-10: 1100 [IU]/h via INTRAVENOUS

## 2013-12-09 MED ORDER — SODIUM CHLORIDE 0.9 % IV SOLN
1250.0000 mg | INTRAVENOUS | Status: DC
  Filled 2013-12-09: qty 1250

## 2013-12-09 MED ORDER — MIDAZOLAM HCL 2 MG/2ML IJ SOLN
INTRAMUSCULAR | Status: AC
  Filled 2013-12-09: qty 4

## 2013-12-09 MED ORDER — FENTANYL CITRATE 0.05 MG/ML IJ SOLN
25.0000 ug | INTRAMUSCULAR | Status: DC | PRN
  Administered 2013-12-09: 50 ug via INTRAVENOUS
  Filled 2013-12-09: qty 2

## 2013-12-09 MED ORDER — LORAZEPAM 2 MG/ML IJ SOLN
2.0000 mg | Freq: Once | INTRAMUSCULAR | Status: AC
  Administered 2013-12-09: 2 mg via INTRAVENOUS
  Filled 2013-12-09: qty 1

## 2013-12-09 NOTE — Progress Notes (Addendum)
ANTICOAGULATION / ANTIBIOTIC CONSULT NOTE - Follow Up Consult  Pharmacy Consult:  Heparin + Vancomycin Indication: atrial fibrillation + empiric for fever / GPC bacteremia  Allergies  Allergen Reactions  . Actos [Pioglitazone Hydrochloride] Other (See Comments)    unkknown  . Erythromycin Hives and Itching  . Escitalopram Oxalate Hives and Itching  . Lipitor [Atorvastatin Calcium] Other (See Comments)    Weakness   . Omnipaque [Iohexol] Other (See Comments)    unknown  . Pletaal [Cilostazol] Other (See Comments)    unknown    Patient Measurements: Height: 5\' 10"  (177.8 cm) Weight: 223 lb 15.8 oz (101.6 kg) IBW/kg (Calculated) : 73 Heparin Dosing Weight: 93 kg  Vital Signs: Temp: 101.8 F (38.8 C) (03/27 0910) Temp src: Oral (03/27 0910) BP: 125/55 mmHg (03/27 0746) Pulse Rate: 111 (03/27 0746)  Labs:  Recent Labs  12/07/13 0422  12/08/13 0455 12/08/13 1630 12/09/13 0200 12/09/13 0430  HGB 8.7*  --  8.9*  --   --  8.1*  HCT 27.6*  --  27.8*  --   --  24.9*  PLT 280  --  343  --   --  347  APTT  --   --   --   --   --  91*  LABPROT  --   --   --   --   --  15.1  INR  --   --   --   --   --  1.22  HEPARINUNFRC  --   < > 0.26* <0.10* 0.36  --   CREATININE 1.93*  --  1.57*  --   --  1.73*  < > = values in this interval not displayed.  Estimated Creatinine Clearance: 42.7 ml/min (by C-G formula based on Cr of 1.73).      Assessment: Patient continues on vancomycin and Zosyn for GPC bacteremia and as empiric therapy for persistent fever.  His renal function is improving.  Will change to vanc to q24 due to age.   Vanc 3/17 >> 3/21, resumed 3/24 >> Zosyn 3/17 >> 3/22, resumed 3/24 >> Levaquin 3/18 >> 3/20; restarted 3/22 >> 3/24  3/17 Bld - negative 3/17 Urine - negative 3/17 Trach Asp - MSSA 3/23 C.diff PCR - negative 3/24 resp cx - neg 3/24 BCx - 1/2 CNS   Goal of Therapy:  Vanc trough 15-20 mcg/mL    Plan:   Change vanc to 1.25g IV q24 Cont  zosyn 3.375g IV q8  Addendum  Resuming heparin at 1100 units/hr tomorrow at 0700 F/u with 8 hr heparin level  Ulyses SouthwardMinh Rayna Brenner, PharmD Pager: [phone] 12/09/2013 1:37 PM

## 2013-12-09 NOTE — Discharge Summary (Signed)
Physician Discharge Summary       Patient ID: Ronald Frank MRN: [id number] DOB/AGE: [birth year] 79 y.o.  Admit date: 11/28/2013 Discharge date: 12/08/2013  Discharge Diagnoses:  Principal Problem:   ARDS (adult respiratory distress syndrome) Active Problems:   HTN (hypertension)   DM (diabetes mellitus)   CAD (coronary artery disease)   Contusion of left hip   Acute renal failure   Left hip pain   Leukocytosis, unspecified   Acute respiratory failure with hypoxia   Acute encephalopathy   Severe sepsis(995.92)   Atrial fibrillation with RVR   Hypernatremia   Staphylococcal pneumonia   Ventilator dependence   Tracheostomy status  Detailed Hospital Course:   79 y.o. M admitted 3/16 for fall, suffered L hip contusion with no fx or prosthesis displacment noted on CT of hip. Developed acute onset tachypnea, tachycardia, hypoxia 3/17. RRT paged and PCCM called for transfer to ICU and ETT placed. Found to be in ARDS and shock, vasopressors and volume resuscitation. Working diagnosis of PNA and severe sepsis/septic shock. He was treated with low Vt ventilation, sedation protocol, pressor support, pan cultures were sent, and empiric antibiotics were started. He was weaned off pressors as of 3/20. On 3/21 course was complicated by AF with RVR. On 3/23 had persistent  fevers. Cultures again were sent. As of 3/24 his mental status had improved, but ventilator requirements increased due to pulmonary edema which we presumed was due to results of his AF w/ RVR. His Na was increased and because of this his free water was increased. We continued diuresis. Unfortunately agitation was also a major barrier to extubation so sedation was increased on 3/26. His culture data came back from blood with 1 of 2 coag negative staph which we felt represented a line infection. On 3/27 the line was ordered to be discontinued with plan to place PICC. Given his acute agitation and slow weaning progress we decided to  place tracheostomy on 3/27 to facilitate weaning efforts. On early 3/28 with increased agitation w/ hypoxia and increased wob. CXR w/ no acute process noted. EKG with no acute changes noted. Troponin was neg. Pt was placed on fentanyl with significant improvement in wob w/ O2 sats improved and paO2 on  ABG improved. Pt appears to be ready for trasfer to  [facility] to complete his antibiotic course and continuing weaning efforts. His plan of care per active dx are listed below.     Discharge Plan by diagnoses   Acute Respiratory Failure w/ failure to wean status post ARDS (resolved), complicated by Pulm edema, delirium, and Very high Ve persists  Small L effusion  Trach placed 3/27 Plan: Wean as tolerated Hope that he should be candidate for decannulation eventuallly    Persistent fever, leukocytosis - improved  MSSA PNA, treated  Positive blood cx - likely contaminant but has 10d old CVL  P:  Micro and abx as above  PICC ordered and DC CVL Complete 9 days total abx vanc and zosyn, (stop date 3/31)   Septic shock, resolved  Chronic RBBB  H/O CAD, HTN  Afib RVR < > Atrial flutter  P:  Cont enteral diltiazem  Cont daily digoxin  Increase enteral metoprolol  Cont full dose UFH and consider transition to warfarin if no complications  Continue Crestor, ASA, Clopidogrel   Acute on chronic renal insuff, nonoliguric - improving  Severe hypernatremia, improving  Mild hypokalemia, resolved  Hypervolemia  P:  Cont free water Monitor BMET intermittently  Monitor I/Os  Correct electrolytes as indicated  Diarrhea, C diff negative  P:  SUP: enteral PPI  Cont TFs -Vital High Protein at 70cc/hr  Cont rectal tube  Mild ICU acquired anemia  P:  DVT px: full dose UFH  Monitor CBC intermittently  Transfuse per usual ICU guidelines   DM 2 - inadequately controlled  P:  Cont SSI protocol  Consider increase Lantus after trach tube placed   Agitated delirium/acute encephalopathy  Vent  dyssynchrony, improved  Severe L hip pain  ICU associated discomfort  P:  Cont fent infusion -severe anxiety  RASS goal 0 to -1     Significant Hospital tests/ studies/ interventions and procedures  SIGNIFICANT EVENTS / STUDIES:  3/16 admitted after mechanical fall at home.  3/16 CT L hip: no abnormalities noted  3/17 Acute respiratory distress. Transferred to ICU and intubated. ARDS protocol. Shock > vasopressors and volume resuscitation. Presumed PNA and severe sepsis/septic shock  3/17 Emergent echocardiogram: LVEF 55%, RV systolic function was mildly reduced.  3/17 CT head: chronic sinusitis. Otherwise NAD  3/18 LE venous Dopplers: NEG  3/19 Improved vent requirements  3/20 Off vasopressors  3/21 AFRVR. diltiazem infusion initiated  3/20 - 3/23 persistent, recurrent fevers  3/23 Vent mechanics much improved. Tolerated PS 5-10 cm H2O. Discussion with family about goals of care initiated  3/24 Cognition improved. Follows commands. Converted from AFRVR to flutter with 4:1 block. Enteral metoprolol initiated. Diltiazem infusion weaned from 20 to 10 mg/hr. Vent requirements increased. Mild edema pattern on CXR. Furosemide ordered. Worsening hypernatremia (Na 163). Free water increased (D5W added to enteral free water). Full dose heparin initiated  3/25 CXR and gas exchange improved  3/26 Agitation requiring resumption of continuous sedative/analgesics   LINES / TUBES:  R radial A-line 3/17>> 3/24  L [place] CVL 3/17 >> 3/27 (ordered)  ETT 3/17 >>  PICC 3/27 (ordered) >>   CULTURES:  Blood 3/17 >> neg  Urine 3/17 >> neg  Resp 3/17 >> MSSA  C diff 3/23 >> NEG  Urine 3/24 >> UA negative  Resp 3/24 >> NOF  Blood 3/24 >> 1/2 coag neg staph (likely contaminant)   ANTIBIOTICS:  Ceftriaxone 3/17 x 1  Vanc 3/17 >>>3/21  Pip-tazo 3/17 >>>3/22  levofloxacin 3/18 >> 3/24  Vanc 3/24 >>  Pip-tazo 3/24 >>    Discharge Exam: BP 94/35  Pulse 80  Temp(Src) 100 F (37.8 C) (Oral)  Resp  27  Ht 5\' 10"  (1.778 m)  Wt 101.6 kg (223 lb 15.8 oz)  BMI 32.14 kg/m2  SpO2 100%  General: sedated on vent , calm  Neuro: no focal deficits  HEENT: WNL  Cardiovascular: IRIR, tachy, no M  Lungs: clear anteriorly, prolonged exp phase  Abdomen: BS x 4, soft, NT/ND.  Ext: symmetric trace edema   Labs at discharge Lab Results  Component Value Date   CREATININE 1.70* 11/18/2013   BUN 68* 12/09/2013   NA 145 12/05/2013   K 4.6 11/18/2013   CL 109 11/13/2013   CO2 22 12/08/2013   Lab Results  Component Value Date   WBC 23.5* 11/17/2013   HGB 7.3* 11/17/2013   HCT 22.6* 12/01/2013   MCV 102.3* 11/17/2013   PLT 340 12/09/2013   Lab Results  Component Value Date   ALT 90* 12/08/2013   AST 89* 12/08/2013   ALKPHOS 47 12/08/2013   BILITOT 0.3 12/08/2013   Lab Results  Component Value Date   INR 1.22 12/09/2013   INR 1.30 11/29/2013  INR 0.88 07/24/2012    Current radiology studies Dg Chest Port 1 View  Jan 02, 2014   CLINICAL DATA:  Increased shortness of breath.  Intubated.  EXAM: PORTABLE CHEST - 1 VIEW  COMPARISON:  January 02, 2014  FINDINGS: Tracheostomy tube and NG tube remain in place, unchanged. Prior CABG. Increasing airspace opacity in the left retrocardiac and infrahilar regions. Stable patchy opacity peripherally in the right mid lung, likely inferior right upper lobe. No visible effusions. Heart is normal size. No acute bony abnormality.  IMPRESSION: Stable patchy opacity peripherally in the right mid lung. Increasing left infrahilar and retrocardiac opacity, possible pneumonia.   Electronically Signed   By: Charlett Nose M.D.   On: 01/02/14 12:24   Dg Chest Port 1 View  Jan 02, 2014   CLINICAL DATA:  Respiratory failure  EXAM: PORTABLE CHEST - 1 VIEW  COMPARISON:  DG CHEST 1V PORT dated 12/09/2013  FINDINGS: Tracheostomy tube stable in position. Enteric tube courses inferior to the diaphragm, tip projects over the left upper quadrant, likely within the stomach. Left upper extremity PICC  line tip projects over the superior vena cava, unchanged. Stable cardiac and mediastinal contours status post median sternotomy. Lungs are well aerated. Interval improvement of previously visualized heterogeneous opacities left greater than right lung bases. No definite pleural effusion or pneumothorax.  IMPRESSION: 1. Stable support apparatus. 2. Improved aeration with near complete resolution of bibasilar opacities most compatible with resolving atelectasis.   Electronically Signed   By: Annia Belt M.D.   On: 01-02-2014 07:29   Chest Portable 1 View To Assess Tube Placement And Rule-out Pneumothorax  12/09/2013   CLINICAL DATA:  Tube placement and rule out pneumothorax.  EXAM: PORTABLE CHEST - 1 VIEW  COMPARISON:  DG CHEST 1V PORT dated 12/08/2013  FINDINGS: The endotracheal tube has been removed and the patient now has a tracheostomy tube that appears to be well positioned above the carina. There is a nasogastric tube but the distal aspect is poorly characterized. There are increased interstitial densities in both lungs suggesting edema. Bibasilar densities suggest atelectasis and possibly small effusions. Negative for a pneumothorax. Left arm PICC line tip in the upper SVC region.  IMPRESSION: Negative for a pneumothorax following placement of tracheostomy tube.  Increased interstitial densities and bibasilar densities. Findings suggest interstitial pulmonary edema and cannot exclude small effusions.   Electronically Signed   By: Richarda Overlie M.D.   On: 12/09/2013 13:35   Dg Chest Port 1 View  12/08/2013   CLINICAL DATA:  PICC placement.  EXAM: PORTABLE CHEST - 1 VIEW  COMPARISON:  Chest x-ray 12/08/2013.  FINDINGS: There is a left upper extremity PICC with tip terminating in the mid superior vena cava. Right subclavian central venous catheter with tip terminating in the mid superior vena cava. An endotracheal tube is in place with tip 4.8 cm above the carina. A nasogastric tube is seen extending into the  stomach, however, the tip of the nasogastric tube extends below the lower margin of the image. Lung volumes are low. Patchy interstitial and airspace opacities throughout the lungs bilaterally, most confluent in the left lower lobe, concerning for multilobar pneumonia. No definite cephalization of the pulmonary vasculature. Heart size is normal. Upper mediastinal contours are within normal limits allowing for slight patient rotation in the right. Atherosclerosis in the thoracic aorta. Status post median sternotomy for CABG.  IMPRESSION: 1. Support apparatus, as above. 2. Patchy multifocal asymmetrically distributed interstitial and airspace disease in the lungs bilaterally, most confluent in the  left lower lobe, concerning for multilobar bronchopneumonia. 3. Atherosclerosis.   Electronically Signed   By: Trudie Reedaniel  Entrikin M.D.   On: 12/08/2013 21:49    Disposition:  06-Home-Health Care Svc       Future Appointments Provider Department Dept Phone   01/04/2014 1:30 PM Myeong Eyvonne LeftSheard, DPM [facility] [phone]   05/08/2014 3:00 PM [facility] [address] [phone]   05/08/2014 3:30 PM [facility] [place] [address] [phone]   05/08/2014 4:00 PM Carma LairSuzanne L Nickel, NP [facility] [phone]     Current facility-administered medications:0.9 %  sodium chloride infusion, , Intravenous, Continuous, Storm FriskPatrick E Wright, MD, Last Rate: 10 mL/hr at 01-13-2014 0940;  acetaminophen (TYLENOL) solution 650 mg, 650 mg, Per Tube, Q4H PRN, Leslye Peerobert S Byrum, MD, 650 mg at 01-13-2014 0825;  albuterol (PROVENTIL) (2.5 MG/3ML) 0.083% nebulizer solution 2.5 mg, 2.5 mg, Inhalation, Q6H, Merwyn Katosavid B Simonds, MD, 2.5 mg at 01-13-2014 0757 antiseptic oral rinse (BIOTENE) solution 15 mL, 15 mL, Mouth Rinse, QID, Merwyn Katosavid B Simonds, MD, 15 mL at 01-13-2014 1200;  aspirin chewable tablet 81 mg, 81 mg, Oral, Daily, Lupita Leashouglas B McQuaid, MD, 81 mg at 01-13-2014  1221;  chlorhexidine (PERIDEX) 0.12 % solution 15 mL, 15 mL, Mouth Rinse, BID, Merwyn Katosavid B Simonds, MD, 15 mL at 01-13-2014 0831;  clopidogrel (PLAVIX) tablet 75 mg, 75 mg, Per Tube, Q breakfast, Oretha Milchakesh V Alva, MD, 75 mg at 01-13-2014 1325 digoxin (LANOXIN) 0.05 MG/ML solution 0.125 mg, 0.125 mg, Oral, Daily, Merwyn Katosavid B Simonds, MD, 0.125 mg at 01-13-2014 1043;  diltiazem (CARDIZEM) 10 mg/ml oral suspension 30 mg, 30 mg, Per Tube, 4 times per day, Merwyn Katosavid B Simonds, MD, 30 mg at 01-13-2014 1147;  ezetimibe (ZETIA) tablet 10 mg, 10 mg, Per Tube, Daily, Merwyn Katosavid B Simonds, MD, 10 mg at 01-13-2014 1043 feeding supplement (VITAL HIGH PROTEIN) liquid 1,000 mL, 1,000 mL, Per Tube, Continuous, Hettie HolsteinKimberly Alverson Harris, RD, Last Rate: 70 mL/hr at 01-13-2014 0600, 1,000 mL at 01-13-2014 0600;  fentaNYL (SUBLIMAZE) 10 mcg/mL in sodium chloride 0.9 % 250 mL infusion, 0-400 mcg/hr, Intravenous, Continuous, Bess Saltzman S Dima Ferrufino, NP, Last Rate: 10 mL/hr at 01-13-2014 0924, 100 mcg/hr at 01-13-2014 0924 fentaNYL (SUBLIMAZE) bolus via infusion 25-50 mcg, 25-50 mcg, Intravenous, Q1H PRN, Avonna Iribe S Kaytlin Burklow, NP, 50 mcg at 01-13-2014 1440;  free water 200 mL, 200 mL, Per Tube, Q4H, Merwyn Katosavid B Simonds, MD, 200 mL at 01-13-2014 1400;  heparin ADULT infusion 100 units/mL (25000 units/250 mL), 1,100 Units/hr, Intravenous, Continuous, Oretha Milchakesh V Alva, MD, Last Rate: 11 mL/hr at 01-13-2014 1107, 1,100 Units/hr at 01-13-2014 1107 hydrALAZINE (APRESOLINE) injection 10-40 mg, 10-40 mg, Intravenous, Q4H PRN, Storm FriskPatrick E Wright, MD, 10 mg at 12/06/13 1528;  insulin aspart (novoLOG) injection 0-20 Units, 0-20 Units, Subcutaneous, 6 times per day, Merwyn Katosavid B Simonds, MD, 11 Units at 01-13-2014 1222;  [START ON 12/11/2013] insulin glargine (LANTUS) injection 60 Units, 60 Units, Subcutaneous, Daily, Lupita Leashouglas B McQuaid, MD metoprolol (LOPRESSOR) injection 2.5-5 mg, 2.5-5 mg, Intravenous, Q3H PRN, Merwyn Katosavid B Simonds, MD, 5 mg at 12/07/13 1745;  metoprolol tartrate (LOPRESSOR) 25 mg/10 mL oral suspension 50  mg, 50 mg, Per Tube, Q8H, Merwyn Katosavid B Simonds, MD, 50 mg at 01-13-2014 1043;  midazolam (VERSED) injection 1 mg, 1 mg, Intravenous, Q2H PRN, Zigmund GottronElizabeth C Deterding, MD, 1 mg at 01-13-2014 0809 rosuvastatin (CRESTOR) tablet 20 mg, 20 mg, Per Tube, q1800, Merwyn Katosavid B Simonds, MD, 20 mg at 12/09/13 1725;  sodium chloride 0.9 %  injection 10-40 mL, 10-40 mL, Intracatheter, Q12H, Oretha Milch, MD, 10 mL at 01-Jan-2014 1044;  sodium chloride 0.9 % injection 10-40 mL, 10-40 mL, Intracatheter, PRN, Oretha Milch, MD;  vancomycin (VANCOCIN) 1,250 mg in sodium chloride 0.9 % 250 mL IVPB, 1,250 mg, Intravenous, Q24H, Merwyn Katos, MD    Discharged Condition: stable  Physician Statement:   The Patient was personally examined, the discharge assessment and plan has been personally reviewed and I agree with Gurjit Loconte NP-C  assessment and plan. > 30 minutes of time have been dedicated to discharge assessment, planning and discharge instructions.   Signed: Pecolia Marando NP-C  01-01-2014, 2:48 PM

## 2013-12-09 NOTE — Care Management Note (Signed)
    Page 1 of 2   12/09/2013     11:39:02 AM   CARE MANAGEMENT NOTE 12/09/2013  Patient:  Ronald Frank,Ronald Frank   Account Number:  [id number]  Date Initiated:  12/05/2013  Documentation initiated by:  Junius CreamerWELL,DEBBIE  Subjective/Objective Assessment:   adm w resp distress  on vent     Action/Plan:   lives w wife, pcp d alteimer   Anticipated DC Date:  12/13/2013   Anticipated DC Plan:  LONG TERM ACUTE CARE ([facility])      DC Planning Services  CM consult      Choice offered to / List presented to:             Status of service:  In process, will continue to follow Medicare Important Message given?   (If response is "NO", the following Medicare IM given date fields will be blank) Date Medicare IM given:   Date Additional Medicare IM given:    Discharge Disposition:    Per UR Regulation:  Reviewed for med. necessity/level of care/duration of stay  If discussed at Long Length of Stay Meetings, dates discussed:   12/06/2013  12/08/2013    Comments:  Contact:  Ezekiel,Susan Daughter [phone]                 Picco,Daelon "Skip" Son     [phone]  12-09-13 11:35am Avie ArenasSarah Trinidad Ingle, RNBSN [phone] Plan for trach today.  Meets for [facility]. Both [facility] offering beds.  Talked with daughter and son and both would like father to go to [facility] post trach and would prefer [facility].  Aware that tx may be as soon as 3-28. Staff, physician and w/e CM updated.  12-08-13 1pm Avie ArenasSarah Aliena Ghrist, RNBSN [phone] Not weaning on vent - planning for trach.  [facility] consult placed.  12-06-13 3:15pm Avie ArenasSarah Eliot Bencivenga, RNBSN [phone] Continues on vent -  Afib - on cardizem.

## 2013-12-09 NOTE — Progress Notes (Signed)
ANTICOAGULATION CONSULT NOTE - Follow Up Consult  Pharmacy Consult for heparin Indication: atrial fibrillation  Labs:  Recent Labs  12/06/13 0350 12/06/13 1730  12/07/13 0422  12/08/13 0455 12/08/13 1630 12/09/13 0200  HGB 9.8*  --   --  8.7*  --  8.9*  --   --   HCT 30.6*  --   --  27.6*  --  27.8*  --   --   PLT 259  --   --  280  --  343  --   --   HEPARINUNFRC  --   --   < >  --   < > 0.26* <0.10* 0.36  CREATININE 1.95* 1.94*  --  1.93*  --  1.57*  --   --   < > = values in this interval not displayed.   Assessment/Plan:  79yo male now therapeutic on heparin after rate increase.  Plan to hold this am for trach.  Will continue gtt for now and f/u s/p procedure to resume.  Ronald Frank, PharmD, BCPS  12/09/2013,2:36 AM

## 2013-12-09 NOTE — Progress Notes (Signed)
PULMONARY / CRITICAL CARE MEDICINE   Name: Ronald HeapsHenry T Szczesniak MRN: [id number] DOB: 12/08/1934    ADMISSION DATE:  11/28/2013 CONSULTATION DATE:  11/29/2013  REFERRING MD :  Benjamine MolaVann PRIMARY SERVICE: PCCM  CHIEF COMPLAINT:  Resp Distress  BRIEF PATIENT DESCRIPTION: 79 y.o. M admitted 3/16 for fall, suffered L hip contusion with no fx or prosthesis displacment noted on CT of hip.  Developed acute onset tachypnea, tachycardia, hypoxia 3/17.  RRT paged and PCCM called for transfer to ICU and ETT placed.  SIGNIFICANT EVENTS / STUDIES:  3/16 admitted after mechanical fall at home. 3/16 CT L hip: no abnormalities noted 3/17 Acute respiratory distress. Transferred to ICU and intubated. ARDS protocol. Shock > vasopressors and volume resuscitation. Presumed PNA and severe sepsis/septic shock 3/17 Emergent echocardiogram: LVEF 55%, RV systolic function was mildly reduced. 3/17 CT head: chronic sinusitis. Otherwise NAD 3/18 LE venous Dopplers: NEG 3/19 Improved vent requirements 3/20 Off vasopressors 3/21 AFRVR. diltiazem infusion initiated 3/20 - 3/23 persistent, recurrent fevers 3/23 Vent mechanics much improved. Tolerated PS 5-10 cm H2O. Discussion with family about goals of care initiated 3/24  Cognition improved. Follows commands. Converted from AFRVR to flutter with 4:1 block. Enteral metoprolol initiated. Diltiazem infusion weaned from 20 to 10 mg/hr. Vent requirements increased. Mild edema pattern on CXR. Furosemide ordered. Worsening hypernatremia (Na 163). Free water increased (D5W added to enteral free water). Full dose heparin initiated 3/25 CXR and gas exchange improved 3/26 Agitation requiring resumption of continuous sedative/analgesics  LINES / TUBES: R radial A-line 3/17>> 3/24 L [place] CVL 3/17 >> 3/26 ETT 3/17 >> 3/27 LUE PICC 3/26 >>  Trach tube 3/27 >>   CULTURES: Blood 3/17 >> neg Urine 3/17 >> neg Resp 3/17 >> MSSA C diff 3/23 >> NEG Urine 3/24 >> UA negative Resp 3/24 >>  NOF Blood 3/24 >> 1/2 coag neg staph  ANTIBIOTICS: Ceftriaxone 3/17 x 1 Vanc 3/17 >>>3/21 Pip-tazo 3/17 >>>3/22 levofloxacin 3/18 >> 3/24 Pip-tazo 3/24 >> 3/27 Vanc 3/24 >> (plan 7 days for possible line sepsis)   SUBJECTIVE:  RASS -1. Not F/C.   VITAL SIGNS: Temp:  [98.7 F (37.1 C)-101.8 F (38.8 C)] 99.1 F (37.3 C) (03/27 1243) Pulse Rate:  [66-123] 82 (03/27 1513) Resp:  [20-37] 27 (03/27 1513) BP: (109-198)/(34-75) 154/48 mmHg (03/27 1513) SpO2:  [90 %-100 %] 100 % (03/27 1513) FiO2 (%):  [50 %] 50 % (03/27 1514) HEMODYNAMICS:    VENTILATOR SETTINGS: Vent Mode:  [-] PCV FiO2 (%):  [50 %] 50 % Set Rate:  [20 bmp] 20 bmp PEEP:  [5 cmH20] 5 cmH20 Plateau Pressure:  [15 cmH20-19 cmH20] 16 cmH20 INTAKE / OUTPUT: Intake/Output     03/26 0701 - 03/27 0700 03/27 0701 - 03/28 0700   I.V. (mL/kg) 2493 (24.5) 241.3 (2.4)   NG/GT 2190    IV Piggyback 387.5 50   Total Intake(mL/kg) 5070.5 (49.9) 291.3 (2.9)   Urine (mL/kg/hr) 2695 (1.1) 325 (0.4)   Stool 1025 (0.4)    Total Output 3720 325   Net +1350.5 -33.8          PHYSICAL EXAMINATION: General: RASS 11. Not F/C  Neuro: no focal deficits HEENT: WNL Cardiovascular:  IRIR, rate adequately controlled, no M Lungs: clear anteriorly, prolonged exp phase Abdomen: BS x 4, soft, NT/ND.  Ext: symmetric trace edema    LABS:  I have reviewed all of today's lab results. Relevant abnormalities are discussed in the A/P section   CXR: Increased interstitial densities and bibasilar  densities   ASSESSMENT / PLAN:  PULMONARY A: Acute Respiratory Failure ARD, resolved Evanescent infiltrates - likely pulm edema Very high Ve persists Tracheostomy tube dependence P:   Cont full vent support - settings reviewed and/or adjusted Cont vent bundle Wean in PSV mode as tolerated Daily SBT   CARDIOVASCULAR A: Septic shock, resolved Chronic RBBB H/O CAD, HTN Afib/flutter P: Cont enteral diltiazem Cont daily  digoxin Cont enteral metoprolol Resume heparin AM 3/28 AM if no trach site bleeding Continue Crestor, ASA, Clopidogrel  RENAL A:  Acute on chronic renal insuff, nonoliguric - improving Severe hypernatremia, improving Mild hypokalemia, resolved Hypervolemia P:   Cont free water - D5W rate adjusted Monitor BMET intermittently Monitor I/Os Correct electrolytes as indicated  GASTROINTESTINAL A:   Diarrhea, C diff negative P:   SUP: enteral PPI Cont TFs Cont rectal tube for now  HEMATOLOGIC A:   ICU acquired anemia P:  DVT px: full dose UFH Monitor CBC intermittently Transfuse per usual ICU guidelines  INFECTIOUS A: severe sepsis, resolved Persistent fever, leukocytosis MSSA PNA, treated 1/2 positive blood cx - possible line sepsis P:   Micro and abx as above CVL changed out 3/26  ENDOCRINE A:  DM 2 - inadequately controlled P:   Cont SSI protocol  Adjust Lantus  NEUROLOGIC A:  Agitated delirium/acute encephalopathy Vent dyssynchrony, improved Severe L hip pain, controlled ICU associated discomfort P:   DC fentanyl infusion PRN fentanyl RASS goal 0  Family updated in detail. I answered all questions and assured them that we would still be involved in his care after he transfers to [facility].  If no problems today, transfer to [facility] AM 3/28 and PCCM will cont to follow there.    I have personally obtained a history, examined the patient, evaluated laboratory and imaging results, formulated the assessment and plan and placed orders. CRITICAL CARE: The patient is critically ill with multiple organ systems failure and requires high complexity decision making for assessment and support, frequent evaluation and titration of therapies, application of advanced monitoring technologies and extensive interpretation of multiple databases. Critical Care Time devoted to patient care services described in this note is 45  minutes.    Billy Fischer, MD ; [facility]  [phone].  After 5:30 PM or weekends, call [phone]

## 2013-12-09 NOTE — Progress Notes (Signed)
eLink Physician-Brief Progress Note Patient Name: Ronald HeapsHenry T Sime DOB: 07/06/1935 MRN: [id number]  Date of Service  12/09/2013   HPI/Events of Note     eICU Interventions  ASA and plavix held in prep for trach placement 3/27. Heparin gtt is scheduled to be held at 7:30 am   Intervention Category Intermediate Interventions: Bleeding - evaluation and treatment with blood products  BYRUM,ROBERT S. 12/09/2013, 2:31 AM

## 2013-12-09 NOTE — Procedures (Signed)
TRACHEOSTOMY TUBE PLACEMENT  INDICATION: need for prolonged ventilator support CONSENT: Obtained PREMEDS: fentanyl and propofol infusions NMB: vecuronium 10 mg IV X 1  PROCEDURE: performed with bronchoscopic assistance by Dr Tyson AliasFeinstein Patient placed in proper position with shoulder roll  Landmarks identified Area prepped and draped in sterile fashion 1% lidocaine with epi infused liberally with effective blanching  1 cm vertical incision Hemostasis achieved Blunt dissection down to trachea Trachea entered with 14 gauge introducer needle and guide wire advanced under bronchoscopic visualization Punch dilator advanced over wire and removed 26 FR dilator advanced over wire and removed #6 cuffed Shiley over graduated dilator advanced over wire  Trach tube position confirmed with bronchoscopy Trach tube sutured in place  COMPLICATIONS: none. Procedure was well tolerated. CXR reveals proper position and no PTX or pneumomediastinum EBL: minimal   Billy Fischeravid Simonds, MD;  PCCM service; Mobile [phone]

## 2013-12-09 NOTE — Procedures (Signed)
Bronchoscopy Procedure Note Ronald HeapsHenry T Frank [id number] 02/25/1935  Procedure: Bronchoscopy Indications: Diagnostic evaluation of the airways  Procedure Details Consent: Risks of procedure as well as the alternatives and risks of each were explained to the (patient/caregiver).  Consent for procedure obtained. Time Out: Verified patient identification, verified procedure, site/side was marked, verified correct patient position, special equipment/implants available, medications/allergies/relevent history reviewed, required imaging and test results available.  Performed  In preparation for procedure, patient was given 100% FiO2 and bronchoscope lubricated. Sedation: Etomidate  Airway entered and the following bronchi were examined: Bronchi.   Procedures performed: Brushings performed no Placed scope, backed ett up to 17 cm, observed entire trach procedure, placement needle, wire, dialtors, trach. Then placed bronch through new trach, carina 6 cm below no bleeding, trauma noted. Bronchoscope removed.    Evaluation Hemodynamic Status: BP stable throughout; O2 sats: stable throughout Patient's Current Condition: stable Specimens:  None Complications: No apparent complications Patient did tolerate procedure well.   Nelda BucksFEINSTEIN,DANIEL J. 12/09/2013

## 2013-12-09 NOTE — Procedures (Signed)
Bedside Tracheostomy Insertion Procedure Note   Patient Details:   Name: Ronald Frank DOB: 06/25/1935 MRN: [id number]  Procedure: Tracheostomy  Pre Procedure Assessment: ET Tube Size:7.5 ET Tube secured at lip (cm):24 Bite block in place: No Breath Sounds: Clear  Post Procedure Assessment: BP 154/48  Pulse 82  Temp(Src) 99.1 F (37.3 C) (Oral)  Resp 27  Ht 5\' 10"  (1.778 m)  Wt 223 lb 15.8 oz (101.6 kg)  BMI 32.14 kg/m2  SpO2 100% O2 sats: stable throughout Complications: No apparent complications Patient did tolerate procedure well Tracheostomy Brand:Shiley Tracheostomy Style:Cuffed Tracheostomy Size: 6.0 Tracheostomy Secured WUJ:WJXBJYNvia:Sutures Tracheostomy Placement Confirmation:Trach cuff visualized and in place and Chest X ray ordered for placement    Ronald Frank, Ronald Frank 12/09/2013, 4:04 PM

## 2013-12-10 ENCOUNTER — Other Ambulatory Visit (HOSPITAL_COMMUNITY): Payer: Medicare Other

## 2013-12-10 ENCOUNTER — Inpatient Hospital Stay (HOSPITAL_COMMUNITY): Payer: Medicare Other

## 2013-12-10 ENCOUNTER — Inpatient Hospital Stay
Admission: RE | Admit: 2013-12-10 | Discharge: 2014-01-13 | Disposition: E | Payer: Medicare Other | Attending: Internal Medicine | Admitting: Internal Medicine

## 2013-12-10 ENCOUNTER — Other Ambulatory Visit: Payer: Self-pay

## 2013-12-10 DIAGNOSIS — J80 Acute respiratory distress syndrome: Secondary | ICD-10-CM

## 2013-12-10 DIAGNOSIS — J9601 Acute respiratory failure with hypoxia: Secondary | ICD-10-CM

## 2013-12-10 DIAGNOSIS — Z930 Tracheostomy status: Secondary | ICD-10-CM

## 2013-12-10 DIAGNOSIS — G9340 Encephalopathy, unspecified: Secondary | ICD-10-CM

## 2013-12-10 LAB — BLOOD GAS, ARTERIAL
ACID-BASE EXCESS: 1 mmol/L (ref 0.0–2.0)
Bicarbonate: 24.5 mEq/L — ABNORMAL HIGH (ref 20.0–24.0)
FIO2: 0.6 %
LHR: 20 {breaths}/min
O2 Saturation: 98.2 %
PATIENT TEMPERATURE: 98.6
PCO2 ART: 35 mmHg (ref 35.0–45.0)
PEEP/CPAP: 5 cmH2O
Pressure control: 10 cmH2O
TCO2: 25.6 mmol/L (ref 0–100)
pH, Arterial: 7.459 — ABNORMAL HIGH (ref 7.350–7.450)
pO2, Arterial: 81.3 mmHg (ref 80.0–100.0)

## 2013-12-10 LAB — GLUCOSE, CAPILLARY
GLUCOSE-CAPILLARY: 253 mg/dL — AB (ref 70–99)
Glucose-Capillary: 197 mg/dL — ABNORMAL HIGH (ref 70–99)
Glucose-Capillary: 222 mg/dL — ABNORMAL HIGH (ref 70–99)
Glucose-Capillary: 283 mg/dL — ABNORMAL HIGH (ref 70–99)

## 2013-12-10 LAB — BASIC METABOLIC PANEL
BUN: 68 mg/dL — ABNORMAL HIGH (ref 6–23)
CHLORIDE: 109 meq/L (ref 96–112)
CO2: 22 meq/L (ref 19–32)
Calcium: 7.7 mg/dL — ABNORMAL LOW (ref 8.4–10.5)
Creatinine, Ser: 1.7 mg/dL — ABNORMAL HIGH (ref 0.50–1.35)
GFR calc Af Amer: 43 mL/min — ABNORMAL LOW (ref >90)
GFR calc non Af Amer: 37 mL/min — ABNORMAL LOW (ref >90)
GLUCOSE: 309 mg/dL — AB (ref 70–99)
Potassium: 4.6 mEq/L (ref 3.7–5.3)
SODIUM: 145 meq/L (ref 137–147)

## 2013-12-10 LAB — CBC
HCT: 22.6 % — ABNORMAL LOW (ref 39.0–52.0)
Hemoglobin: 7.3 g/dL — ABNORMAL LOW (ref 13.0–17.0)
MCH: 33 pg (ref 26.0–34.0)
MCHC: 32.3 g/dL (ref 30.0–36.0)
MCV: 102.3 fL — ABNORMAL HIGH (ref 78.0–100.0)
PLATELETS: 340 10*3/uL (ref 150–400)
RBC: 2.21 MIL/uL — AB (ref 4.22–5.81)
RDW: 15.6 % — ABNORMAL HIGH (ref 11.5–15.5)
WBC: 23.5 10*3/uL — AB (ref 4.0–10.5)

## 2013-12-10 LAB — HEPARIN LEVEL (UNFRACTIONATED)

## 2013-12-10 LAB — POCT I-STAT 3, ART BLOOD GAS (G3+)
ACID-BASE DEFICIT: 3 mmol/L — AB (ref 0.0–2.0)
Acid-base deficit: 2 mmol/L (ref 0.0–2.0)
BICARBONATE: 22.7 meq/L (ref 20.0–24.0)
Bicarbonate: 21.4 mEq/L (ref 20.0–24.0)
O2 SAT: 91 %
O2 Saturation: 97 %
PCO2 ART: 36.7 mmHg (ref 35.0–45.0)
PO2 ART: 63 mmHg — AB (ref 80.0–100.0)
Patient temperature: 100.5
TCO2: 22 mmol/L (ref 0–100)
TCO2: 24 mmol/L (ref 0–100)
pCO2 arterial: 34.6 mmHg — ABNORMAL LOW (ref 35.0–45.0)
pH, Arterial: 7.404 (ref 7.350–7.450)
pH, Arterial: 7.405 (ref 7.350–7.450)
pO2, Arterial: 90 mmHg (ref 80.0–100.0)

## 2013-12-10 LAB — TROPONIN I: Troponin I: <0.3 ng/mL (ref <0.30)

## 2013-12-10 LAB — CORTISOL: Cortisol, Plasma: 18.8 ug/dL

## 2013-12-10 LAB — PRO B NATRIURETIC PEPTIDE: Pro B Natriuretic peptide (BNP): 2583 pg/mL — ABNORMAL HIGH (ref 0–450)

## 2013-12-10 LAB — LACTIC ACID, PLASMA: LACTIC ACID, VENOUS: 1.7 mmol/L (ref 0.5–2.2)

## 2013-12-10 MED ORDER — HEPARIN (PORCINE) IN NACL 100-0.45 UNIT/ML-% IJ SOLN
1300.0000 [IU]/h | INTRAMUSCULAR | Status: DC
  Administered 2013-12-10: 1300 [IU]/h via INTRAVENOUS
  Filled 2013-12-10: qty 250

## 2013-12-10 MED ORDER — INSULIN GLARGINE 100 UNIT/ML ~~LOC~~ SOLN
50.0000 [IU] | Freq: Every day | SUBCUTANEOUS | Status: DC
  Administered 2013-12-10: 50 [IU] via SUBCUTANEOUS
  Filled 2013-12-10 (×2): qty 0.5

## 2013-12-10 MED ORDER — FENTANYL CITRATE 0.05 MG/ML IJ SOLN
INTRAMUSCULAR | Status: AC
  Administered 2013-12-10: 100 ug
  Filled 2013-12-10: qty 2

## 2013-12-10 MED ORDER — FUROSEMIDE 10 MG/ML IJ SOLN
40.0000 mg | Freq: Once | INTRAMUSCULAR | Status: AC
  Administered 2013-12-10: 40 mg via INTRAVENOUS
  Filled 2013-12-10: qty 4

## 2013-12-10 MED ORDER — CLOPIDOGREL 5 MG/30 ML ORAL SUSPENSION
75.0000 mg | Freq: Every day | ORAL | Status: DC
  Filled 2013-12-10 (×2): qty 450

## 2013-12-10 MED ORDER — ASPIRIN 81 MG PO CHEW
81.0000 mg | CHEWABLE_TABLET | Freq: Every day | ORAL | Status: DC
  Administered 2013-12-10: 81 mg via ORAL
  Filled 2013-12-10: qty 1

## 2013-12-10 MED ORDER — FENTANYL BOLUS VIA INFUSION
25.0000 ug | INTRAVENOUS | Status: DC | PRN
  Administered 2013-12-10 (×2): 50 ug via INTRAVENOUS
  Filled 2013-12-10: qty 50

## 2013-12-10 MED ORDER — MIDAZOLAM HCL 2 MG/2ML IJ SOLN
1.0000 mg | INTRAMUSCULAR | Status: DC | PRN
  Administered 2013-12-10 (×2): 1 mg via INTRAVENOUS
  Filled 2013-12-10 (×2): qty 2

## 2013-12-10 MED ORDER — SODIUM CHLORIDE 0.9 % IV SOLN
0.0000 ug/h | INTRAVENOUS | Status: DC
  Administered 2013-12-10: 100 ug/h via INTRAVENOUS
  Filled 2013-12-10: qty 50

## 2013-12-10 MED ORDER — INSULIN GLARGINE 100 UNIT/ML ~~LOC~~ SOLN: 60.0000 [IU] | Freq: Every day | SUBCUTANEOUS | Status: DC

## 2013-12-10 MED ORDER — MIDAZOLAM HCL 2 MG/2ML IJ SOLN
1.0000 mg | INTRAMUSCULAR | Status: AC | PRN
  Administered 2013-12-10 (×3): 1 mg via INTRAVENOUS
  Filled 2013-12-10 (×2): qty 2

## 2013-12-10 MED ORDER — CLOPIDOGREL BISULFATE 75 MG PO TABS
75.0000 mg | ORAL_TABLET | Freq: Every day | ORAL | Status: DC
  Administered 2013-12-10: 75 mg
  Filled 2013-12-10 (×3): qty 1

## 2013-12-10 MED ORDER — HEPARIN (PORCINE) IN NACL 100-0.45 UNIT/ML-% IJ SOLN
1100.0000 [IU]/h | INTRAMUSCULAR | Status: DC
  Filled 2013-12-10 (×2): qty 250

## 2013-12-10 MED ORDER — HALOPERIDOL LACTATE 5 MG/ML IJ SOLN
10.0000 mg | Freq: Once | INTRAMUSCULAR | Status: AC
  Administered 2013-12-10: 10 mg via INTRAVENOUS
  Filled 2013-12-10: qty 2

## 2013-12-10 NOTE — Progress Notes (Signed)
PULMONARY / CRITICAL CARE MEDICINE   Name: Ronald Frank MRN: [id number] DOB: 07/05/1935    ADMISSION DATE:  11/28/2013 CONSULTATION DATE:  11/29/2013  REFERRING MD :  Benjamine MolaVann PRIMARY SERVICE: PCCM  CHIEF COMPLAINT:  Resp Distress  BRIEF PATIENT DESCRIPTION: 79 y.o. M admitted 3/16 for fall, suffered L hip contusion with no fx or prosthesis displacment noted on CT of hip.  Developed acute onset tachypnea, tachycardia, hypoxia 3/17.  RRT paged and PCCM called for transfer to ICU and ETT placed.  SIGNIFICANT EVENTS / STUDIES:  3/16 admitted after mechanical fall at home. 3/16 CT L hip: no abnormalities noted 3/17 Acute respiratory distress. Transferred to ICU and intubated. ARDS protocol. Shock > vasopressors and volume resuscitation. Presumed PNA and severe sepsis/septic shock 3/17 Emergent echocardiogram: LVEF 55%, RV systolic function was mildly reduced. 3/17 CT head: chronic sinusitis. Otherwise NAD 3/18 LE venous Dopplers: NEG 3/19 Improved vent requirements 3/20 Off vasopressors 3/21 AFRVR. diltiazem infusion initiated 3/20 - 3/23 persistent, recurrent fevers 3/23 Vent mechanics much improved. Tolerated PS 5-10 cm H2O. Discussion with family about goals of care initiated 3/24  Cognition improved. Follows commands. Converted from AFRVR to flutter with 4:1 block. Enteral metoprolol initiated. Diltiazem infusion weaned from 20 to 10 mg/hr. Vent requirements increased. Mild edema pattern on CXR. Furosemide ordered. Worsening hypernatremia (Na 163). Free water increased (D5W added to enteral free water). Full dose heparin initiated 3/25 CXR and gas exchange improved 3/26 Agitation requiring resumption of continuous sedative/analgesics 3/27 Trach   LINES / TUBES: R radial A-line 3/17>> 3/24 L [place] CVL 3/17 >> 3/27 (ordered) ETT 3/17 >> 3/27 PICC 3/27 left  >>  Trach 3/27 >>   CULTURES: Blood 3/17 >> neg Urine 3/17 >> neg Resp 3/17 >> MSSA C diff 3/23 >> NEG Urine 3/24 >> UA  negative Resp 3/24 >> NOF Blood 3/24 >> 1/2 coag neg staph (likely contaminant) BC x 2 3/28>> UC 3/28 >>   ANTIBIOTICS: Ceftriaxone 3/17 x 1 Vanc 3/17 >>>3/21 Pip-tazo 3/17 >>>3/22 levofloxacin 3/18 >> 3/24 Vanc 3/24 >>  Pip-tazo 3/24 >>   SUBJECTIVE:  Increased WOB w/ drop in sats early am 3/27  Agitated Fevers persist   VITAL SIGNS: Temp:  [98.4 F (36.9 C)-101.8 F (38.8 C)] 100.5 F (38.1 C) (03/28 0427) Pulse Rate:  [79-111] 103 (03/28 0802) Resp:  [20-42] 42 (03/28 0802) BP: (98-174)/(29-109) 151/109 mmHg (03/28 0700) SpO2:  [93 %-100 %] 96 % (03/28 0802) FiO2 (%):  [50 %] 50 % (03/28 0803) HEMODYNAMICS:    VENTILATOR SETTINGS: Vent Mode:  [-] PCV FiO2 (%):  [50 %] 50 % Set Rate:  [20 bmp] 20 bmp PEEP:  [5 cmH20] 5 cmH20 Plateau Pressure:  [16 cmH20] 16 cmH20 INTAKE / OUTPUT: Intake/Output     03/27 0701 - 03/28 0700 03/28 0701 - 03/29 0700   I.V. (mL/kg) 980.2 (9.6)    NG/GT 1750    IV Piggyback 50    Total Intake(mL/kg) 2780.2 (27.4)    Urine (mL/kg/hr) 1550 (0.6)    Stool 450 (0.2)    Total Output 2000     Net +780.2            PHYSICAL EXAMINATION: General:. Agitated, diaphoretic  Neuro:  Agitated  HEENT: WNL Cardiovascular:  IRIR, tachy, no M Lungs: tachypneic , increased WOB, accessory use  Abdomen: BS x 4, soft, NT/ND.  Ext: symmetric trace-1+ edema    LABS:    Recent Labs Lab 12/08/13 0455 12/09/13 0430 2014-07-11 0500  HGB 8.9* 8.1* 7.3*  HCT 27.8* 24.9* 22.6*  WBC 25.9* 23.0* 23.5*  PLT 343 347 340     Recent Labs Lab 12/03/13 1950 12/06/13 1633 01/01/14 0834  PHART 7.395 7.495* 7.404  PCO2ART 43.3 31.1* 34.6*  PO2ART 60.4* 56.0* 63.0*  HCO3 26.0* 23.9 21.4  TCO2 27.3 25 22   O2SAT 89.8 91.0 91.0    Recent Labs Lab 12/06/13 1730 12/07/13 0422 12/08/13 0455 12/09/13 0430 January 01, 2014 0500  NA 157* 155* 153* 150* 145  K 3.5* 3.4* 3.9 4.2 4.6  CL 119* 114* 115* 111 109  CO2 26 25 25 25 22   GLUCOSE 344* 326*  206* 157* 309*  BUN 77* 75* 63* 66* 68*  CREATININE 1.94* 1.93* 1.57* 1.73* 1.70*  CALCIUM 7.8* 7.8* 7.8* 7.8* 7.7*     CXR: improved aeration , near resolution of bibasilar opacities   ASSESSMENT / PLAN:  PULMONARY A: Acute Respiratory Failure s/p trach 3/27  ARDS, resolved Pulm edema-resolved  3/28 > increased wob , drop in sats despite improved cxr    P:   Cont full vent support - settings reviewed and/or .fio2 adjusted Cont vent bundle Lasix X 1 dose 3/28  Repeat abg in 2 hr , if remains hypoxic consider repeat cxr +/- CT angio    CARDIOVASCULAR A: Septic shock, resolved Chronic RBBB H/O CAD, HTN Afib RVR < > Atrial flutter on hep drip  P: Cont enteral diltiazem Cont daily digoxin Cont metoprolol Cont full dose UFH -   Continue Crestor Check EKG and stat card enzymes , check bnp  Restart ASA, plavix  RENAL A:  Acute on chronic renal insuff, nonoliguric - Hypervolemia P:   Cont free water -  Monitor BMET intermittently Monitor I/Os Correct electrolytes as indicated D/c D5W   GASTROINTESTINAL A:   Diarrhea, C diff negative P:   SUP: enteral PPI Cont TFs Cont rectal tube  HEMATOLOGIC A:    ICU acquired anemia 3/28 >hbg tr down  P:  DVT px: full dose UFH Monitor CBC intermittently Transfuse per usual ICU guidelines hemocult stool x 1   INFECTIOUS A: severe sepsis Persistent fever, leukocytosis - MSSA PNA, treated Positive blood cx - lines changed P:   Micro and abx as above Pan Cx  Cont vanc /zosyn   ENDOCRINE A:  DM 2 - still inadequately controlled P:   Cont SSI protocol  Increase Lantus to 60 D/c D5w IVF   NEUROLOGIC A:  Agitated delirium/acute encephalopathy Vent dyssynchrony Severe L hip pain  ICU associated discomfort 3/28 >increased agitation ,delirium  P:   Begin  fent infusion  RASS goal 0 to -1       Tammy Parrett NP-C  [facility]  [phone]   Attending:  I have seen and  examined the patient with nurse practitioner/resident and agree with the note above.   Severe anxiety attack this morning leading to rapid, shallow breathing and hypoxemia. The only intervention that corrected his hypoxemia was adding sedation with fentanyl.  His son notes severe anxiety and panic attacks for years, particularly when short of breath.  Fortunately there is no other explanation on this morning's EKG, CXR and lab work.  Plan: -move to [facility] today -maintain on fentanyl gtt through Monday morning -PCCM to follow in [facility]  Son updated by me  CC time 40 minutes.  Yolonda Kida PCCM Pager: [phone] Cell: [phone] If no response, call [phone]

## 2013-12-10 NOTE — Progress Notes (Signed)
ANTICOAGULATION / ANTIBIOTIC CONSULT NOTE - Follow Up Consult  Pharmacy Consult:  Heparin Indication: atrial fibrillation   Allergies  Allergen Reactions  . Actos [Pioglitazone Hydrochloride] Other (See Comments)    unkknown  . Erythromycin Hives and Itching  . Escitalopram Oxalate Hives and Itching  . Lipitor [Atorvastatin Calcium] Other (See Comments)    Weakness   . Omnipaque [Iohexol] Other (See Comments)    unknown  . Pletaal [Cilostazol] Other (See Comments)    unknown    Patient Measurements: Height: 5\' 10"  (177.8 cm) Weight: 223 lb 15.8 oz (101.6 kg) IBW/kg (Calculated) : 73 Heparin Dosing Weight: 93 kg  Vital Signs: Temp: 100 F (37.8 C) (03/28 1140) Temp src: Oral (03/28 1140) BP: 94/35 mmHg (03/28 1200) Pulse Rate: 80 (03/28 1200)  Labs:  Recent Labs  12/08/13 0455 12/08/13 1630 12/09/13 0200 12/09/13 0430 30-Oct-2013 0500 30-Oct-2013 0838 30-Oct-2013 1450  HGB 8.9*  --   --  8.1* 7.3*  --   --   HCT 27.8*  --   --  24.9* 22.6*  --   --   PLT 343  --   --  347 340  --   --   APTT  --   --   --  91*  --   --   --   LABPROT  --   --   --  15.1  --   --   --   INR  --   --   --  1.22  --   --   --   HEPARINUNFRC 0.26* <0.10* 0.36  --   --   --  <0.10*  CREATININE 1.57*  --   --  1.73* 1.70*  --   --   TROPONINI  --   --   --   --   --  <0.30  --     Estimated Creatinine Clearance: 43.4 ml/min (by C-G formula based on Cr of 1.7).  Assessment:  Pt was started on IV heparin this AM for afib. Pt got trached yesterday. Level came back low this AM for some reason. Rn said it has run fine. Will adjust rate.   Goal of Therapy:   Heparin level = 0.3-0.7   Plan:   Increase heparin drip to 1300 units/hr Check 8 hr heparin level

## 2013-12-10 NOTE — Discharge Summary (Signed)
MCQUAID, DOUGLAS [place] PCCM Pager: [phone] Cell: [phone] If no response, call [phone]  

## 2013-12-11 ENCOUNTER — Other Ambulatory Visit (HOSPITAL_COMMUNITY): Payer: Medicare Other

## 2013-12-11 ENCOUNTER — Encounter: Payer: Self-pay | Admitting: Radiology

## 2013-12-11 LAB — BASIC METABOLIC PANEL
BUN: 72 mg/dL — AB (ref 6–23)
CO2: 24 mEq/L (ref 19–32)
Calcium: 7.8 mg/dL — ABNORMAL LOW (ref 8.4–10.5)
Chloride: 111 mEq/L (ref 96–112)
Creatinine, Ser: 1.73 mg/dL — ABNORMAL HIGH (ref 0.50–1.35)
GFR calc Af Amer: 42 mL/min — ABNORMAL LOW (ref >90)
GFR, EST NON AFRICAN AMERICAN: 36 mL/min — AB (ref >90)
Glucose, Bld: 278 mg/dL — ABNORMAL HIGH (ref 70–99)
Potassium: 4.3 mEq/L (ref 3.7–5.3)
Sodium: 147 mEq/L (ref 137–147)

## 2013-12-11 LAB — BLOOD GAS, ARTERIAL
Acid-base deficit: 0.7 mmol/L (ref 0.0–2.0)
Bicarbonate: 23.2 mEq/L (ref 20.0–24.0)
FIO2: 0.6 %
O2 Saturation: 93.3 %
PATIENT TEMPERATURE: 100.6
PEEP: 5 cmH2O
PO2 ART: 74.4 mmHg — AB (ref 80.0–100.0)
PRESSURE CONTROL: 10 cmH2O
RATE: 20 resp/min
TCO2: 24.3 mmol/L (ref 0–100)
pCO2 arterial: 38.1 mmHg (ref 35.0–45.0)
pH, Arterial: 7.407 (ref 7.350–7.450)

## 2013-12-11 LAB — HEPATIC FUNCTION PANEL
ALK PHOS: 54 U/L (ref 39–117)
ALT: 118 U/L — ABNORMAL HIGH (ref 0–53)
AST: 68 U/L — AB (ref 0–37)
Albumin: 1.5 g/dL — ABNORMAL LOW (ref 3.5–5.2)
Total Protein: 5.2 g/dL — ABNORMAL LOW (ref 6.0–8.3)

## 2013-12-11 LAB — CBC WITH DIFFERENTIAL/PLATELET
Basophils Absolute: 0 10*3/uL (ref 0.0–0.1)
Basophils Relative: 0 % (ref 0–1)
EOS ABS: 0.2 10*3/uL (ref 0.0–0.7)
EOS PCT: 1 % (ref 0–5)
HEMATOCRIT: 19.6 % — AB (ref 39.0–52.0)
Hemoglobin: 6.2 g/dL — CL (ref 13.0–17.0)
LYMPHS ABS: 1.1 10*3/uL (ref 0.7–4.0)
Lymphocytes Relative: 6 % — ABNORMAL LOW (ref 12–46)
MCH: 32.8 pg (ref 26.0–34.0)
MCHC: 31.6 g/dL (ref 30.0–36.0)
MCV: 103.7 fL — AB (ref 78.0–100.0)
MONO ABS: 0.6 10*3/uL (ref 0.1–1.0)
MONOS PCT: 3 % (ref 3–12)
Neutro Abs: 16.7 10*3/uL — ABNORMAL HIGH (ref 1.7–7.7)
Neutrophils Relative %: 90 % — ABNORMAL HIGH (ref 43–77)
Platelets: 325 10*3/uL (ref 150–400)
RBC: 1.89 MIL/uL — AB (ref 4.22–5.81)
RDW: 15.9 % — ABNORMAL HIGH (ref 11.5–15.5)
WBC: 18.5 10*3/uL — ABNORMAL HIGH (ref 4.0–10.5)

## 2013-12-11 LAB — PROTIME-INR
INR: 1.3 (ref 0.00–1.49)
Prothrombin Time: 15.9 seconds — ABNORMAL HIGH (ref 11.6–15.2)

## 2013-12-11 LAB — URINE CULTURE
CULTURE: NO GROWTH
Colony Count: NO GROWTH

## 2013-12-11 LAB — CK: CK TOTAL: 331 U/L — AB (ref 7–232)

## 2013-12-11 LAB — T4, FREE: FREE T4: 0.95 ng/dL (ref 0.80–1.80)

## 2013-12-11 LAB — APTT: aPTT: 38 seconds — ABNORMAL HIGH (ref 24–37)

## 2013-12-11 LAB — TSH: TSH: 0.499 u[IU]/mL (ref 0.350–4.500)

## 2013-12-11 LAB — PREPARE RBC (CROSSMATCH)

## 2013-12-11 LAB — ABO/RH: ABO/RH(D): O POS

## 2013-12-11 LAB — PRO B NATRIURETIC PEPTIDE: PRO B NATRI PEPTIDE: 3068 pg/mL — AB (ref 0–450)

## 2013-12-11 LAB — PROCALCITONIN: PROCALCITONIN: 0.44 ng/mL

## 2013-12-11 LAB — PREALBUMIN: Prealbumin: 11.7 mg/dL — ABNORMAL LOW (ref 17.0–34.0)

## 2013-12-11 LAB — TROPONIN I: Troponin I: <0.3 ng/mL (ref <0.30)

## 2013-12-11 MED ORDER — IOHEXOL 350 MG/ML SOLN
80.0000 mL | Freq: Once | INTRAVENOUS | Status: AC | PRN
  Administered 2013-12-11: 67 mL via INTRAVENOUS

## 2013-12-12 DIAGNOSIS — J9600 Acute respiratory failure, unspecified whether with hypoxia or hypercapnia: Secondary | ICD-10-CM

## 2013-12-12 DIAGNOSIS — J9589 Other postprocedural complications and disorders of respiratory system, not elsewhere classified: Secondary | ICD-10-CM

## 2013-12-12 DIAGNOSIS — G9340 Encephalopathy, unspecified: Secondary | ICD-10-CM

## 2013-12-12 DIAGNOSIS — Z930 Tracheostomy status: Secondary | ICD-10-CM

## 2013-12-12 LAB — BASIC METABOLIC PANEL
BUN: 88 mg/dL — ABNORMAL HIGH (ref 6–23)
CHLORIDE: 113 meq/L — AB (ref 96–112)
CO2: 22 mEq/L (ref 19–32)
Calcium: 7.8 mg/dL — ABNORMAL LOW (ref 8.4–10.5)
Creatinine, Ser: 1.89 mg/dL — ABNORMAL HIGH (ref 0.50–1.35)
GFR calc non Af Amer: 33 mL/min — ABNORMAL LOW (ref >90)
GFR, EST AFRICAN AMERICAN: 38 mL/min — AB (ref >90)
Glucose, Bld: 371 mg/dL — ABNORMAL HIGH (ref 70–99)
POTASSIUM: 5.6 meq/L — AB (ref 3.7–5.3)
Sodium: 149 mEq/L — ABNORMAL HIGH (ref 137–147)

## 2013-12-12 LAB — BLOOD GAS, ARTERIAL
Acid-base deficit: 2.4 mmol/L — ABNORMAL HIGH (ref 0.0–2.0)
Bicarbonate: 21.7 mEq/L (ref 20.0–24.0)
FIO2: 0.55 %
LHR: 20 {breaths}/min
O2 SAT: 96.6 %
PATIENT TEMPERATURE: 98.6
PEEP/CPAP: 5 cmH2O
Pressure control: 10 cmH2O
TCO2: 22.8 mmol/L (ref 0–100)
pCO2 arterial: 36.4 mmHg (ref 35.0–45.0)
pH, Arterial: 7.393 (ref 7.350–7.450)
pO2, Arterial: 89.5 mmHg (ref 80.0–100.0)

## 2013-12-12 LAB — CBC
HEMATOCRIT: 24.9 % — AB (ref 39.0–52.0)
HEMOGLOBIN: 8.4 g/dL — AB (ref 13.0–17.0)
MCH: 32.4 pg (ref 26.0–34.0)
MCHC: 33.7 g/dL (ref 30.0–36.0)
MCV: 96.1 fL (ref 78.0–100.0)
Platelets: 315 10*3/uL (ref 150–400)
RBC: 2.59 MIL/uL — ABNORMAL LOW (ref 4.22–5.81)
RDW: 21.3 % — ABNORMAL HIGH (ref 11.5–15.5)
WBC: 15.7 10*3/uL — ABNORMAL HIGH (ref 4.0–10.5)

## 2013-12-12 LAB — CULTURE, BLOOD (ROUTINE X 2): CULTURE: NO GROWTH

## 2013-12-12 LAB — OCCULT BLOOD X 1 CARD TO LAB, STOOL
FECAL OCCULT BLD: POSITIVE — AB
Fecal Occult Bld: POSITIVE — AB

## 2013-12-12 LAB — GLUCOSE, CAPILLARY: Glucose-Capillary: 204 mg/dL — ABNORMAL HIGH (ref 70–99)

## 2013-12-12 LAB — DIGOXIN LEVEL: DIGOXIN LVL: 1.1 ng/mL (ref 0.8–2.0)

## 2013-12-12 LAB — HEMOGLOBIN AND HEMATOCRIT, BLOOD
HCT: 25.6 % — ABNORMAL LOW (ref 39.0–52.0)
HEMOGLOBIN: 8.6 g/dL — AB (ref 13.0–17.0)

## 2013-12-12 NOTE — Progress Notes (Addendum)
[facility]                                                                                              Progress note     Patient Demographics  Ronald Frank, is a 79 y.o. male  ZOX:[id number]  [id number]  DOB - 17-Aug-1935  Admit date - 2014/01/07  Admitting Physician Elnora Morrison, MD  Outpatient Primary MD for the patient is Junious Silk, MD  LOS - 2   Chief complaint  respiratory failure  Sepsis  Atrial fib with RVR           Subjective:   Unable to obtain due to patient's condition  Objective:   Vital signs  Temperature100.8  Heart rate86  Respiratory rate31  Blood pressure140/32  Pulse ox98%    Exam  obtunded , No new F.N deficits,  [place].AT, moist mucous membranes , pinpoint pupils  Supple Neck,No JVD, No cervical lymphadenopathy appriciated. Tracheostomy and midline noted  Symmetrical Chest wall movement,  decreased breath sounds bilaterally with crackles and rhonchi RRR,No Gallops,Rubs or new Murmurs, No Parasternal Heave +ve B.Sounds, Abd Soft, Non tender, No organomegaly appriciated, No rebound - guarding or rigidity. No Cyanosis, Clubbing or edema, No new Rash or bruise     I&Os1822/800  NG tube yes Foley yes Trach yes  Data Review   CBC  Recent Labs Lab 12/08/13 0455 12/09/13 0430 01-07-14 0500 12/11/13 0653 12/12/13 0534 12/12/13 0643  WBC 25.9* 23.0* 23.5* 18.5*  --  15.7*  HGB 8.9* 8.1* 7.3* 6.2* 8.6* 8.4*  HCT 27.8* 24.9* 22.6* 19.6* 25.6* 24.9*  PLT 343 347 340 325  --  315  MCV 101.8* 103.3* 102.3* 103.7*  --  96.1  MCH 32.6 33.6 33.0 32.8  --  32.4  MCHC 32.0 32.5 32.3 31.6  --  33.7  RDW 16.1* 15.6* 15.6* 15.9*  --  21.3*  LYMPHSABS  --   --   --  1.1  --   --   MONOABS  --   --   --  0.6  --   --   EOSABS  --   --   --  0.2  --   --   BASOSABS  --   --   --  0.0  --   --     Chemistries   Recent Labs Lab  12/08/13 0455 12/09/13 0430 07-Jan-2014 0500 12/11/13 0653 12/12/13 0643  NA 153* 150* 145 147 149*  K 3.9 4.2 4.6 4.3 5.6*  CL 115* 111 109 111 113*  CO2 25 25 22 24 22   GLUCOSE 206* 157* 309* 278* 371*  BUN 63* 66* 68* 72* 88*  CREATININE 1.57* 1.73* 1.70* 1.73* 1.89*  CALCIUM 7.8* 7.8* 7.7* 7.8* 7.8*  AST 89*  --   --  68*  --   ALT 90*  --   --  118*  --   ALKPHOS 47  --   --  54  --   BILITOT 0.3  --   --  <0.2*  --      Recent Labs  12/11/13 [phone]  TSH 0.499   Coagulation profile  Recent Labs Lab 12/09/13 0430 12/11/13 0653  INR 1.22 1.30    No results found for this basename: DDIMER,  in the last 72 hours  Cardiac Enzymes  Recent Labs Lab 11/19/2013 0838 12/11/13 1645  TROPONINI <0.30 <0.30   ------------------------------------------------------------------------------------------------------------------ No components found with this basename: POCBNP,   Micro Results Recent Results (from the past 240 hour(s))  CLOSTRIDIUM DIFFICILE BY PCR     Status: None   Collection Time    12/05/13  2:34 PM      Result Value Ref Range Status   C difficile by pcr NEGATIVE  NEGATIVE Final  CULTURE, BLOOD (ROUTINE X 2)     Status: None   Collection Time    12/06/13 10:50 AM      Result Value Ref Range Status   Specimen Description BLOOD LEFT ANTECUBITAL   Final   Special Requests BOTTLES DRAWN AEROBIC AND ANAEROBIC 10CC   Final   Culture  Setup Time     Final   Value: 12/06/2013 17:54     Performed at [facility]   Culture     Final   Value: NO GROWTH 5 DAYS     Performed at [facility]   Report Status 12/12/2013 FINAL   Final  CULTURE, BLOOD (ROUTINE X 2)     Status: None   Collection Time    12/06/13 11:04 AM      Result Value Ref Range Status   Specimen Description BLOOD LEFT HAND   Final   Special Requests BOTTLES DRAWN AEROBIC ONLY 3CC   Final   Culture  Setup Time     Final   Value: 12/06/2013 17:50     Performed at [facility]   Culture     Final   Value: STAPHYLOCOCCUS SPECIES (COAGULASE NEGATIVE)     Note: THE SIGNIFICANCE OF ISOLATING THIS ORGANISM FROM A SINGLE SET OF BLOOD CULTURES WHEN MULTIPLE SETS ARE DRAWN IS UNCERTAIN. PLEASE NOTIFY THE MICROBIOLOGY DEPARTMENT WITHIN ONE WEEK IF SPECIATION AND SENSITIVITIES ARE REQUIRED.     Note: Gram Stain Report Called to,Read Back By and Verified With: LINDSAY VERNER ON 12/07/2013 AT 8:43P BY WILEJ     Performed at [facility]   Report Status 12/08/2013 FINAL   Final  CULTURE, RESPIRATORY (NON-EXPECTORATED)     Status: None   Collection Time    12/06/13 12:59 PM      Result Value Ref Range Status   Specimen Description TRACHEAL ASPIRATE   Final   Special Requests NONE   Final   Gram Stain     Final   Value: RARE WBC PRESENT,BOTH PMN AND MONONUCLEAR     NO SQUAMOUS EPITHELIAL CELLS SEEN     NO ORGANISMS SEEN     Performed at [facility]   Culture     Final   Value: Non-Pathogenic Oropharyngeal-type Flora Isolated.     Performed at [facility]   Report Status 12/08/2013 FINAL   Final  URINE CULTURE     Status: None   Collection Time    11/23/2013  9:39 AM      Result Value Ref Range Status   Specimen Description URINE, CATHETERIZED   Final   Special Requests NONE   Final   Culture  Setup Time     Final   Value: 11/27/2013 18:51     Performed at [facility] Count  Final   Value: NO GROWTH     [facility]   Culture     Final   Value: NO GROWTH     [facility]   Report Status 12/11/2013 FINAL   Final  CULTURE, BLOOD (ROUTINE X 2)     Status: None   Collection Time    12/01/2013 10:00 AM      Result Value Ref Range Status   Specimen Description BLOOD RIGHT HAND   Final   Special Requests BOTTLES DRAWN AEROBIC ONLY 6CC   Final   Culture  Setup Time     Final   Value: 11/16/2013 18:44     [facility]   Culture     Final   Value:         BLOOD CULTURE RECEIVED NO GROWTH TO DATE CULTURE WILL BE HELD FOR 5 DAYS BEFORE ISSUING A FINAL NEGATIVE REPORT     [facility]   Report Status PENDING   Incomplete  CULTURE, BLOOD (ROUTINE X 2)     Status: None   Collection Time    11/30/2013 10:15 AM      Result Value Ref Range Status   Specimen Description BLOOD RIGHT HAND   Final   Special Requests BOTTLES DRAWN AEROBIC ONLY 5CC   Final   Culture  Setup Time     Final   Value: 12/08/2013 18:44     [facility] at [facility]   Culture     Final   Value:        BLOOD CULTURE RECEIVED NO GROWTH TO DATE CULTURE WILL BE HELD FOR 5 DAYS BEFORE ISSUING A FINAL NEGATIVE REPORT     [facility]   Report Status PENDING   Incomplete  CULTURE, RESPIRATORY (NON-EXPECTORATED)     Status: None   Collection Time    12/11/13  4:32 AM      Result Value Ref Range Status   Specimen Description TRACHEAL ASPIRATE   Final   Special Requests NONE   Final   Gram Stain     Final   Value: MODERATE WBC PRESENT, PREDOMINANTLY PMN     FEW SQUAMOUS EPITHELIAL CELLS PRESENT     NO ORGANISMS SEEN     [facility]   Culture PENDING   Incomplete   Report Status PENDING   Incomplete  CULTURE, BLOOD (ROUTINE X 2)     Status: None   Collection Time    12/11/13  6:50 AM      Result Value Ref Range Status   Specimen Description BLOOD RIGHT PICC LINE   Final   Special Requests BOTTLES DRAWN AEROBIC AND ANAEROBIC 10CC   Final   Culture  Setup Time     Final   Value: 12/11/2013 16:53     [facility] at [facility]   Culture     Final   Value:        BLOOD CULTURE RECEIVED NO GROWTH TO DATE CULTURE WILL BE HELD FOR 5 DAYS BEFORE ISSUING A FINAL NEGATIVE REPORT     [facility] at [facility]   Report Status PENDING   Incomplete       Assessment & Plan   VDRF ; continue with mechanical ventilation, weaning per protocol , patient on PC 10 with respiratory rate of 20 , FiO2          55% with  deep of 5 and respiratory rate of 20. Gases look fair.? Neurologic etiology Sepsis with septic shock continue with vancomycin and meropenem A. fib with RVR anticoagulation on hold due to to anemia, continue with digoxin, Cardizem, and metoprolol Altered mental status Acute on chronic renal injury, worsening Hyponatremia increase freefluids Diarrhea continue with flexiseal Anemia status post blood transfusion, positive blood per stools Coronary artery disease status post CABG CVA on aspirin and Crestor Peripheral vascular disease we'll hold aspirin today Left hip contusion status post arthroplasty Agitation/delirium continue with fentanyl and Versed COPD continue with neb treatments Diabetes mellitus type 2 on Levemir and I SS  hyperkalemia we'll give Kayexalate  Plan  Increase free water per tube Kayexalate per tube DC aspirin Place SCDs  Code Status: full   DVT Prophylaxis SCDs   Carron Curie M.D on 12/12/2013 at 11:18 AM

## 2013-12-12 NOTE — Progress Notes (Signed)
PULMONARY / CRITICAL CARE MEDICINE   Name: Ronald Frank MRN: [id number] DOB: October 27, 1934    ADMISSION DATE:  2013/12/30 CONSULTATION DATE:  11/29/2013  REFERRING MD :  [facility]  CHIEF COMPLAINT:  Resp Distress  BRIEF PATIENT DESCRIPTION: 79 y.o. M admitted 3/16 for fall, suffered L hip contusion with no fx or prosthesis displacment noted on CT of hip.  Developed acute onset tachypnea, tachycardia, hypoxia 3/17.  RRT paged and PCCM called for transfer to ICU and ETT placed.  SIGNIFICANT EVENTS / STUDIES:  3/16 admitted after mechanical fall at home. 3/16 CT L hip: no abnormalities noted 3/17 Acute respiratory distress. Transferred to ICU and intubated. ARDS protocol. Shock > vasopressors and volume resuscitation. Presumed PNA and severe sepsis/septic shock 3/17 Emergent echocardiogram: LVEF 55%, RV systolic function was mildly reduced. 3/17 CT head: chronic sinusitis. Otherwise NAD 3/18 LE venous Dopplers: NEG 3/19 Improved vent requirements 3/20 Off vasopressors 3/21 AFRVR. diltiazem infusion initiated 3/20 - 3/23 persistent, recurrent fevers 3/23 Vent mechanics much improved. Tolerated PS 5-10 cm H2O. Discussion with family about goals of care initiated 3/24  Cognition improved. Follows commands. Converted from AFRVR to flutter with 4:1 block. Enteral metoprolol initiated. Diltiazem infusion weaned from 20 to 10 mg/hr. Vent requirements increased. Mild edema pattern on CXR. Furosemide ordered. Worsening hypernatremia (Na 163). Free water increased (D5W added to enteral free water). Full dose heparin initiated 3/25 CXR and gas exchange improved 3/26 Agitation requiring resumption of continuous sedative/analgesics 3/27 Trach   LINES / TUBES: R radial A-line 3/17>> 3/24 L [place] CVL 3/17 >> 3/27 (ordered) ETT 3/17 >> 3/27 PICC 3/27 left  >>  Trach 3/27 >>  CULTURES: Blood 3/17 >> neg Urine 3/17 >> neg Resp 3/17 >> MSSA C diff 3/23 >> NEG Urine 3/24 >> UA negative Resp 3/24 >>  NOF Blood 3/24 >> 1/2 coag neg staph (likely contaminant) BC x 2 3/28>> UC 3/28 >>  ANTIBIOTICS: Ceftriaxone 3/17 x 1 Vanc 3/17 >>>3/21 Pip-tazo 3/17 >>>3/22 levofloxacin 3/18 >> 3/24 Vanc 3/24 >>  Pip-tazo 3/24 >>   SUBJECTIVE:  Increased WOB w/ drop in sats early am 3/27  Agitated Fevers persist   VITAL SIGNS: Reviewed  PHYSICAL EXAMINATION: General:. Sedated on vent  Neuro:  Agitated  HEENT: WNL Cardiovascular:  IRIR, tachy, no M Lungs:scattered rhonchi  Abdomen: BS x 4, soft, NT/ND.  Ext: symmetric trace-1+ edema  LABS:  Recent Labs Lab 12-30-2013 0500 12/11/13 0653 12/12/13 0534 12/12/13 0643  HGB 7.3* 6.2* 8.6* 8.4*  HCT 22.6* 19.6* 25.6* 24.9*  WBC 23.5* 18.5*  --  15.7*  PLT 340 325  --  315     Recent Labs Lab 2013-12-30 0834 12/30/2013 1132 Dec 30, 2013 2015 12/11/13 1220 12/12/13 0535  PHART 7.404 7.405 7.459* 7.407 7.393  PCO2ART 34.6* 36.7 35.0 38.1 36.4  PO2ART 63.0* 90.0 81.3 74.4* 89.5  HCO3 21.4 22.7 24.5* 23.2 21.7  TCO2 22 24 25.6 24.3 22.8  O2SAT 91.0 97.0 98.2 93.3 96.6    Recent Labs Lab 12/08/13 0455 12/09/13 0430 12/30/2013 0500 12/11/13 0653 12/12/13 0643  NA 153* 150* 145 147 149*  K 3.9 4.2 4.6 4.3 5.6*  CL 115* 111 109 111 113*  CO2 25 25 22 24 22   GLUCOSE 206* 157* 309* 278* 371*  BUN 63* 66* 68* 72* 88*  CREATININE 1.57* 1.73* 1.70* 1.73* 1.89*  CALCIUM 7.8* 7.8* 7.7* 7.8* 7.8*    IMAGING: Ct Head Wo Contrast  12/11/2013   CLINICAL DATA:  Altered mental status.  EXAM: CT HEAD WITHOUT CONTRAST  TECHNIQUE: Contiguous axial images were obtained from the base of the skull through the vertex without intravenous contrast.  COMPARISON:  11/29/2013.  FINDINGS: Stable enlarged ventricles and subarachnoid spaces. Stable patchy white matter low density in both cerebral hemispheres. No intracranial hemorrhage, mass lesion or CT evidence of acute infarction. Interval bilateral maxillary sinus, ethmoid sinus, sphenoid sinus and  frontal sinus air-fluid levels. Unremarkable bones.  IMPRESSION: 1. Interval acute pansinusitis. 2. No acute intracranial abnormality. 3. Stable atrophy and chronic small vessel white matter ischemic changes.   Electronically Signed   By: Gordan PaymentSteve  Reid M.D.   On: 12/11/2013 18:37   Ct Angio Chest Pe W/cm &/or Wo Cm  12/11/2013   CLINICAL DATA:  Pulmonary embolism.  Ventilated patient.  EXAM: CT ANGIOGRAPHY CHEST WITH CONTRAST  TECHNIQUE: Multidetector CT imaging of the chest was performed using the standard protocol during bolus administration of intravenous contrast. Multiplanar CT image reconstructions and MIPs were obtained to evaluate the vascular anatomy.  CONTRAST:  67mL OMNIPAQUE IOHEXOL 350 MG/ML SOLN  COMPARISON:  DG CHEST 1V PORT dated April 15, 2014  FINDINGS: Tracheostomy is present. Enteric tube passes through the thoracic esophagus and the enteric tube terminates in the proximal stomach. CABG.  Respiratory motion is present on the examination, mildly degrading the study. Segmental and smaller vessels poorly evaluated due to motion. There is no pulmonary embolism in the adequately opacified pulmonary arteries.  Consolidation of the dependent lower lobes is present with a distribution suggesting aspiration. There is underlying emphysema. Interlobular septal thickening and ground-glass attenuation is present at the apices, consistent with superimposed pulmonary edema.  Grossly, the central airways are patent. Left upper extremity PICC terminates in the mid SVC. Aortic atherosclerosis with calcification and mural plaque. No gross acute aortic abnormality. No aggressive osseous lesions. There is no pleural effusion. No pericardial effusion. Low right paratracheal lymph node measures 10 mm short axis. Left hilar lymph node measures 12 mm short axis. Enlarged subcarinal node measures 22 mm x 25 mm and may also be reactive to lower lobe consolidation  Review of the MIP images confirms the above findings.   IMPRESSION: 1. No pulmonary embolism identified. Study is technically degraded by a respiratory motion. 2. Dependent consolidation of both lower lobes, most compatible with aspiration. Underlying emphysema. 3. Enlarged low right paratracheal lymph node and subcarinal node eccentric to the right. Left hilar adenopathy. Lymphadenopathy may be reactive, congestive however neoplasm cannot be excluded. Consider CT follow-up after acute illness for re-evaluation. 4. Interstitial and mild alveolar pulmonary edema.   Electronically Signed   By: Andreas NewportGeoffrey  Lamke M.D.   On: 12/11/2013 18:40   Dg Chest Port 1 View  April 15, 2014   CLINICAL DATA:  Respiratory distress.  EXAM: PORTABLE CHEST - 1 VIEW  COMPARISON:  Earlier today.  FINDINGS: The tracheostomy tube remains in satisfactory position. Stable post CABG changes. Nasogastric tube tip in the proximal stomach. Prominent interstitial markings with improvement. No significant change in a patchy density in the inferior aspect of the right upper lobe, laterally.  IMPRESSION: 1. Stable probable small area of pneumonia in the inferior right upper lobe. 2. Improving interstitial pulmonary edema superimposed on chronic interstitial lung disease.   Electronically Signed   By: Gordan PaymentSteve  Reid M.D.   On: 0August 01, 2015 21:18   Dg Abd Portable 1v  April 15, 2014   CLINICAL DATA:  NG tube placement  EXAM: PORTABLE ABDOMEN - 1 VIEW  COMPARISON:  11/30/2013  FINDINGS: Enteric tube likely terminates in the  gastric cardia. Side port remains in the distal esophagus.  Nonspecific but nonobstructive bowel gas pattern.  Degenerative changes of the lumbar spine.  IMPRESSION: Enteric tube terminates in the gastric cardia.   Electronically Signed   By: Charline Bills M.D.   On: 12/03/2013 21:14   ASSESSMENT AND PLAN:  Acute respiratory failure ARDS Pulmonary edema MSSA pneumonia Tracheostomy status Acute delirium Mediastinal lymphadenopathy   Full mechanical support  Weaning per protocol,  may accept high Ve  D/c sedation and allow for high Ve  ABG: if hyperventilation - neurogenic, if normal / low - VQ mismatch  Repeat CT scan 6-8 weeks to re-eval lymphadenopathy  I have personally obtained history, examined patient, evaluated and interpreted laboratory and imaging results, reviewed medical records, formulated assessment / plan and placed orders.  Lonia Farber, MD [facility] Pager: [phone]  12/12/2013, 4:19 PM     .

## 2013-12-13 ENCOUNTER — Other Ambulatory Visit (HOSPITAL_COMMUNITY): Payer: Medicare Other

## 2013-12-13 LAB — BASIC METABOLIC PANEL
BUN: 89 mg/dL — ABNORMAL HIGH (ref 6–23)
CALCIUM: 8 mg/dL — AB (ref 8.4–10.5)
CO2: 24 mEq/L (ref 19–32)
Chloride: 118 mEq/L — ABNORMAL HIGH (ref 96–112)
Creatinine, Ser: 1.93 mg/dL — ABNORMAL HIGH (ref 0.50–1.35)
GFR calc Af Amer: 37 mL/min — ABNORMAL LOW (ref >90)
GFR, EST NON AFRICAN AMERICAN: 32 mL/min — AB (ref >90)
GLUCOSE: 183 mg/dL — AB (ref 70–99)
Potassium: 4.8 mEq/L (ref 3.7–5.3)
Sodium: 156 mEq/L — ABNORMAL HIGH (ref 137–147)

## 2013-12-13 LAB — CBC
HCT: 29.2 % — ABNORMAL LOW (ref 39.0–52.0)
Hemoglobin: 9.3 g/dL — ABNORMAL LOW (ref 13.0–17.0)
MCH: 31.7 pg (ref 26.0–34.0)
MCHC: 31.8 g/dL (ref 30.0–36.0)
MCV: 99.7 fL (ref 78.0–100.0)
Platelets: 334 10*3/uL (ref 150–400)
RBC: 2.93 MIL/uL — ABNORMAL LOW (ref 4.22–5.81)
RDW: 20.6 % — ABNORMAL HIGH (ref 11.5–15.5)
WBC: 17.1 10*3/uL — ABNORMAL HIGH (ref 4.0–10.5)

## 2013-12-13 LAB — TYPE AND SCREEN
ABO/RH(D): O POS
Antibody Screen: NEGATIVE
Unit division: 0
Unit division: 0

## 2013-12-13 LAB — CULTURE, RESPIRATORY

## 2013-12-13 LAB — CULTURE, RESPIRATORY W GRAM STAIN

## 2013-12-13 NOTE — Progress Notes (Addendum)
[facility]                                                                                              Progress note     Patient Demographics  Ronald Frank, is a 79 y.o. male  AVW:[id number]  WGN:[id number]  DOB - 1934/12/29  Admit date - 12/02/2013  Admitting Physician Elnora Morrison, MD  Outpatient Primary MD for the patient is Junious Silk, MD  LOS - 3   Chief complaint  respiratory failure  Sepsis  Atrial fib with RVR           Subjective:   Unable to obtain due to patient's condition  Objective:   Vital signs  Temperature100.8  Heart rate86  Respiratory rate31  Blood pressure140/32  Pulse ox98%    Exam Obtunded,, No new F.N deficits,  [place].AT, moist mucous membranes , pinpoint pupils  Supple Neck,No JVD, No cervical lymphadenopathy appriciated. Tracheostomy and midline noted  Symmetrical Chest wall movement,  decreased breath sounds bilaterally with crackles and rhonchi RRR,No Gallops,Rubs or new Murmurs, No Parasternal Heave +ve B.Sounds, Abd Soft, Non tender, No organomegaly appriciated, No rebound - guarding or rigidity. No Cyanosis, Clubbing or edema, No new Rash or bruise     [id number]  NG tube yes Foley yes Trach yes  Data Review   CBC  Recent Labs Lab 12/09/13 0430 11/25/2013 0500 12/11/13 0653 12/12/13 0534 12/12/13 0643 12/13/13 0540  WBC 23.0* 23.5* 18.5*  --  15.7* 17.1*  HGB 8.1* 7.3* 6.2* 8.6* 8.4* 9.3*  HCT 24.9* 22.6* 19.6* 25.6* 24.9* 29.2*  PLT 347 340 325  --  315 334  MCV 103.3* 102.3* 103.7*  --  96.1 99.7  MCH 33.6 33.0 32.8  --  32.4 31.7  MCHC 32.5 32.3 31.6  --  33.7 31.8  RDW 15.6* 15.6* 15.9*  --  21.3* 20.6*  LYMPHSABS  --   --  1.1  --   --   --   MONOABS  --   --  0.6  --   --   --   EOSABS  --   --  0.2  --   --   --   BASOSABS  --   --  0.0  --   --   --     Chemistries   Recent Labs Lab 12/08/13 0455  12/09/13 0430 12/03/2013 0500 12/11/13 0653 12/12/13 0643 12/13/13 0540  NA 153* 150* 145 147 149* 156*  K 3.9 4.2 4.6 4.3 5.6* 4.8  CL 115* 111 109 111 113* 118*  CO2 25 25 22 24 22 24   GLUCOSE 206* 157* 309* 278* 371* 183*  BUN 63* 66* 68* 72* 88* 89*  CREATININE 1.57* 1.73* 1.70* 1.73* 1.89* 1.93*  CALCIUM 7.8* 7.8* 7.7* 7.8* 7.8* 8.0*  AST 89*  --   --  68*  --   --   ALT 90*  --   --  118*  --   --   ALKPHOS 47  --   --  54  --   --   BILITOT 0.3  --   --  <  0.2*  --   --      Recent Labs  12/11/13 0653  TSH 0.499   Coagulation profile  Recent Labs Lab 12/09/13 0430 12/11/13 0653  INR 1.22 1.30    No results found for this basename: DDIMER,  in the last 72 hours  Cardiac Enzymes  Recent Labs Lab Dec 24, 2013 0838 12/11/13 1645  TROPONINI <0.30 <0.30   ------------------------------------------------------------------------------------------------------------------ No components found with this basename: POCBNP,   Micro Results Recent Results (from the past 240 hour(s))  CLOSTRIDIUM DIFFICILE BY PCR     Status: None   Collection Time    12/05/13  2:34 PM      Result Value Ref Range Status   C difficile by pcr NEGATIVE  NEGATIVE Final  CULTURE, BLOOD (ROUTINE X 2)     Status: None   Collection Time    12/06/13 10:50 AM      Result Value Ref Range Status   Specimen Description BLOOD LEFT ANTECUBITAL   Final   Special Requests BOTTLES DRAWN AEROBIC AND ANAEROBIC 10CC   Final   Culture  Setup Time     Final   Value: 12/06/2013 17:54     Performed at [facility]   Culture     Final   Value: NO GROWTH 5 DAYS     Performed at [facility]   Report Status 12/12/2013 FINAL   Final  CULTURE, BLOOD (ROUTINE X 2)     Status: None   Collection Time    12/06/13 11:04 AM      Result Value Ref Range Status   Specimen Description BLOOD LEFT HAND   Final   Special Requests BOTTLES DRAWN AEROBIC ONLY 3CC   Final   Culture  Setup Time     Final    Value: 12/06/2013 17:50     Performed at [facility]   Culture     Final   Value: STAPHYLOCOCCUS SPECIES (COAGULASE NEGATIVE)     Note: THE SIGNIFICANCE OF ISOLATING THIS ORGANISM FROM A SINGLE SET OF BLOOD CULTURES WHEN MULTIPLE SETS ARE DRAWN IS UNCERTAIN. PLEASE NOTIFY THE MICROBIOLOGY DEPARTMENT WITHIN ONE WEEK IF SPECIATION AND SENSITIVITIES ARE REQUIRED.     Note: Gram Stain Report Called to,Read Back By and Verified With: LINDSAY VERNER ON 12/07/2013 AT 8:43P BY WILEJ     Performed at [facility]   Report Status 12/08/2013 FINAL   Final  CULTURE, RESPIRATORY (NON-EXPECTORATED)     Status: None   Collection Time    12/06/13 12:59 PM      Result Value Ref Range Status   Specimen Description TRACHEAL ASPIRATE   Final   Special Requests NONE   Final   Gram Stain     Final   Value: RARE WBC PRESENT,BOTH PMN AND MONONUCLEAR     NO SQUAMOUS EPITHELIAL CELLS SEEN     NO ORGANISMS SEEN     Performed at [facility]   Culture     Final   Value: Non-Pathogenic Oropharyngeal-type Flora Isolated.     Performed at [facility]   Report Status 12/08/2013 FINAL   Final  URINE CULTURE     Status: None   Collection Time    2013/12/24  9:39 AM      Result Value Ref Range Status   Specimen Description URINE, CATHETERIZED   Final   Special Requests NONE   Final   Culture  Setup Time     Final  Value: 08-02-2014 18:51     Performed [facility]   Colony Count     Final   Value: NO GROWTH     [facility]   Culture     Final   Value: NO GROWTH     [facility]formed at [facility]   Report Status 12/11/2013 FINAL   Final  CULTURE, BLOOD (ROUTINE X 2)     Status: None   Collection Time    2014-04-18 10:00 AM      Result Value Ref Range Status   Specimen Description BLOOD RIGHT HAND   Final   Special Requests BOTTLES DRAWN AEROBIC ONLY [id number]   Final   Culture  Setup Time     Final   Value: 08-02-2014 18:44     Performed at  [facility]   Culture     Final   Value:        BLOOD CULTURE RECEIVED NO GROWTH TO DATE CULTURE WILL BE HELD FOR 5 DAYS BEFORE ISSUING A FINAL NEGATIVE REPORT     [facility]formed at [facility]   Report Status PENDING   Incomplete  CULTURE, BLOOD (ROUTINE X 2)     Status: None   Collection Time    2014-04-18 10:15 AM      Result Value Ref Range Status   Specimen Description BLOOD RIGHT HAND   Final   Special Requests BOTTLES DRAWN AEROBIC ONLY 5CC   Final   Culture  Setup Time     Final   Value: 08-02-2014 18:44     Performed at [facility]   Culture     Final   Value:        BLOOD CULTURE RECEIVED NO GROWTH TO DATE CULTURE WILL BE HELD FOR 5 DAYS BEFORE ISSUING A FINAL NEGATIVE REPORT     Performed at [facility]   Report Status PENDING   Incomplete  CULTURE, RESPIRATORY (NON-EXPECTORATED)     Status: None   Collection Time    12/11/13  4:32 AM      Result Value Ref Range Status   Specimen Description TRACHEAL ASPIRATE   Final   Special Requests NONE   Final   Gram Stain     Final   Value: MODERATE WBC PRESENT, PREDOMINANTLY PMN     FEW SQUAMOUS EPITHELIAL CELLS PRESENT     NO ORGANISMS SEEN     [facility]formed at [facility]   Culture     Final   Value: Non-Pathogenic Oropharyngeal-type Flora Isolated.     Performed at [facility]   Report Status 12/13/2013 FINAL   Final  CULTURE, BLOOD (ROUTINE X 2)     Status: None   Collection Time    12/11/13  6:50 AM      Result Value Ref Range Status   Specimen Description BLOOD RIGHT PICC LINE   Final   Special Requests BOTTLES DRAWN AEROBIC AND ANAEROBIC 10CC   Final   Culture  Setup Time     Final   Value: 12/11/2013 16:53     Performed at [facility]   Culture     Final   Value:        BLOOD CULTURE RECEIVED NO GROWTH TO DATE CULTURE WILL BE HELD FOR 5 DAYS BEFORE ISSUING A FINAL NEGATIVE REPORT     Performed at [facility]   Report Status PENDING   Incomplete  Assessment & Plan   VDRF ; continue with mechanical ventilation, weaning per protocol ,and respiratory rate of 20. / Neurologic etiology Sepsis with septic shock continue with vancomycin and meropenem A. fib with RVR anticoagulation on hold due to to anemia, continue with digoxin, Cardizem, and metoprolol Altered mental status Acute on chronic renal injury, start IV fluids D5 border from the HypERnatremia increase free fluids, start D5 water Diarrhea continue with flexiseal Anemia status post blood transfusion, positive blood per stools Coronary artery disease status post CABG CVA on aspirin and Crestor Peripheral vascular disease we'll hold aspirin today Left hip contusion status post arthroplasty Agitation/delirium continue with fentanyl and Versed COPD continue with neb treatments Diabetes mellitus type 2 on Levemir and I SS  hyperkalemia   Plan Start D5W Check labs in a.m.   taper fentanyl and Versed drips Critical care time 33 minutes  Code Status:  full   DVT Prophylaxis SCDs   Carron Curie M.D on 12/13/2013 at 2:19 PM

## 2013-12-14 ENCOUNTER — Other Ambulatory Visit (HOSPITAL_COMMUNITY): Payer: Medicare Other

## 2013-12-14 LAB — CBC
HCT: 33.2 % — ABNORMAL LOW (ref 39.0–52.0)
Hemoglobin: 10.9 g/dL — ABNORMAL LOW (ref 13.0–17.0)
MCH: 32.7 pg (ref 26.0–34.0)
MCHC: 32.8 g/dL (ref 30.0–36.0)
MCV: 99.7 fL (ref 78.0–100.0)
PLATELETS: 475 10*3/uL — AB (ref 150–400)
RBC: 3.33 MIL/uL — ABNORMAL LOW (ref 4.22–5.81)
RDW: 19.1 % — ABNORMAL HIGH (ref 11.5–15.5)
WBC: 20.9 10*3/uL — ABNORMAL HIGH (ref 4.0–10.5)

## 2013-12-14 LAB — BLOOD GAS, ARTERIAL
ACID-BASE DEFICIT: 1.1 mmol/L (ref 0.0–2.0)
Bicarbonate: 23.7 mEq/L (ref 20.0–24.0)
FIO2: 0.55 %
O2 SAT: 92.1 %
PEEP: 5 cmH2O
Patient temperature: 98.6
Pressure control: 10 cmH2O
RATE: 20 resp/min
TCO2: 25.1 mmol/L (ref 0–100)
pCO2 arterial: 43.8 mmHg (ref 35.0–45.0)
pH, Arterial: 7.353 (ref 7.350–7.450)
pO2, Arterial: 71.5 mmHg — ABNORMAL LOW (ref 80.0–100.0)

## 2013-12-14 LAB — BASIC METABOLIC PANEL
BUN: 91 mg/dL — ABNORMAL HIGH (ref 6–23)
CALCIUM: 7.8 mg/dL — AB (ref 8.4–10.5)
CO2: 24 mEq/L (ref 19–32)
Chloride: 116 mEq/L — ABNORMAL HIGH (ref 96–112)
Creatinine, Ser: 1.81 mg/dL — ABNORMAL HIGH (ref 0.50–1.35)
GFR calc non Af Amer: 34 mL/min — ABNORMAL LOW (ref >90)
GFR, EST AFRICAN AMERICAN: 40 mL/min — AB (ref >90)
Glucose, Bld: 286 mg/dL — ABNORMAL HIGH (ref 70–99)
Potassium: 5.5 mEq/L — ABNORMAL HIGH (ref 3.7–5.3)
Sodium: 154 mEq/L — ABNORMAL HIGH (ref 137–147)

## 2013-12-14 NOTE — Progress Notes (Signed)
[facility]                                                                                              Progress note     Patient Demographics  Ronald Frank, is a 79 y.o. male  ZOX:[id number]  WJX:[id number]  DOB - 1935-05-24  Admit date - 12/13/2013  Admitting Physician Elnora Morrison, MD  Outpatient Primary MD for the patient is Junious Silk, MD  LOS - 4   Chief complaint  respiratory failure  Sepsis  Atrial fib with RVR           Subjective:   Unable to obtain due to patient's condition  Objective:   Vital signs  Temperature 100.7  Heart rate 90 Respiratory rate 38 Blood pressure 166/53  Pulse ox 93%    Exam Obtunded, shallow breaths,, No new F.N deficits,  [place].AT, moist mucous membranes , pinpoint pupils  Supple Neck,No JVD, No cervical lymphadenopathy appriciated. Tracheostomy and midline noted  Symmetrical Chest wall movement,  decreased breath sounds bilaterally with crackles and rhonchi RRR,No Gallops,Rubs or new Murmurs, No Parasternal Heave +ve B.Sounds, Abd Soft, Non tender, No organomegaly appriciated, No rebound - guarding or rigidity. No Cyanosis, Clubbing or edema, No new Rash or bruise     I&Os 4534/3804  NG tube yes Foley yes Trach yes  Data Review   CBC  Recent Labs Lab 11/19/2013 0500 12/11/13 0653 12/12/13 0534 12/12/13 0643 12/13/13 0540 12/14/13 0645  WBC 23.5* 18.5*  --  15.7* 17.1* 20.9*  HGB 7.3* 6.2* 8.6* 8.4* 9.3* 10.9*  HCT 22.6* 19.6* 25.6* 24.9* 29.2* 33.2*  PLT 340 325  --  315 334 475*  MCV 102.3* 103.7*  --  96.1 99.7 99.7  MCH 33.0 32.8  --  32.4 31.7 32.7  MCHC 32.3 31.6  --  33.7 31.8 32.8  RDW 15.6* 15.9*  --  21.3* 20.6* 19.1*  LYMPHSABS  --  1.1  --   --   --   --   MONOABS  --  0.6  --   --   --   --   EOSABS  --  0.2  --   --   --   --   BASOSABS  --  0.0  --   --   --   --     Chemistries   Recent  Labs Lab 12/08/13 0455  11/15/2013 0500 12/11/13 0653 12/12/13 0643 12/13/13 0540 12/14/13 0905  NA 153*  < > 145 147 149* 156* 154*  K 3.9  < > 4.6 4.3 5.6* 4.8 5.5*  CL 115*  < > 109 111 113* 118* 116*  CO2 25  < > 22 24 22 24 24   GLUCOSE 206*  < > 309* 278* 371* 183* 286*  BUN 63*  < > 68* 72* 88* 89* 91*  CREATININE 1.57*  < > 1.70* 1.73* 1.89* 1.93* 1.81*  CALCIUM 7.8*  < > 7.7* 7.8* 7.8* 8.0* 7.8*  AST 89*  --   --  68*  --   --   --   ALT 90*  --   --  118*  --   --   --   ALKPHOS 47  --   --  54  --   --   --   BILITOT 0.3  --   --  <0.2*  --   --   --   < > = values in this interval not displayed.  No results found for this basename: TSH, T4TOTAL, FREET3, T3FREE, THYROIDAB,  in the last 72 hours Coagulation profile  Recent Labs Lab 12/09/13 0430 12/11/13 0653  INR 1.22 1.30    No results found for this basename: DDIMER,  in the last 72 hours  Cardiac Enzymes  Recent Labs Lab 12/01/2013 0838 12/11/13 1645  TROPONINI <0.30 <0.30   ------------------------------------------------------------------------------------------------------------------ No components found with this basename: POCBNP,   Micro Results Recent Results (from the past 240 hour(s))  CLOSTRIDIUM DIFFICILE BY PCR     Status: None   Collection Time    12/05/13  2:34 PM      Result Value Ref Range Status   C difficile by pcr NEGATIVE  NEGATIVE Final  CULTURE, BLOOD (ROUTINE X 2)     Status: None   Collection Time    12/06/13 10:50 AM      Result Value Ref Range Status   Specimen Description BLOOD LEFT ANTECUBITAL   Final   Special Requests BOTTLES DRAWN AEROBIC AND ANAEROBIC 10CC   Final   Culture  Setup Time     Final   Value: 12/06/2013 17:54     Performed at [facility]   Culture     Final   Value: NO GROWTH 5 DAYS     Performed at [facility]   Report Status 12/12/2013 FINAL   Final  CULTURE, BLOOD (ROUTINE X 2)     Status: None   Collection Time    12/06/13  11:04 AM      Result Value Ref Range Status   Specimen Description BLOOD LEFT HAND   Final   Special Requests BOTTLES DRAWN AEROBIC ONLY 3CC   Final   Culture  Setup Time     Final   Value: 12/06/2013 17:50     Performed at [facility]   Culture     Final   Value: STAPHYLOCOCCUS SPECIES (COAGULASE NEGATIVE)     Note: THE SIGNIFICANCE OF ISOLATING THIS ORGANISM FROM A SINGLE SET OF BLOOD CULTURES WHEN MULTIPLE SETS ARE DRAWN IS UNCERTAIN. PLEASE NOTIFY THE MICROBIOLOGY DEPARTMENT WITHIN ONE WEEK IF SPECIATION AND SENSITIVITIES ARE REQUIRED.     Note: Gram Stain Report Called to,Read Back By and Verified With: LINDSAY VERNER ON 12/07/2013 AT 8:43P BY WILEJ     Performed at [facility]   Report Status 12/08/2013 FINAL   Final  CULTURE, RESPIRATORY (NON-EXPECTORATED)     Status: None   Collection Time    12/06/13 12:59 PM      Result Value Ref Range Status   Specimen Description TRACHEAL ASPIRATE   Final   Special Requests NONE   Final   Gram Stain     Final   Value: RARE WBC PRESENT,BOTH PMN AND MONONUCLEAR     NO SQUAMOUS EPITHELIAL CELLS SEEN     NO ORGANISMS SEEN     Performed at [facility]   Culture     Final   Value: Non-Pathogenic Oropharyngeal-type Flora Isolated.     Performed at [facility]   Report Status 12/08/2013 FINAL   Final  URINE CULTURE  Status: None   Collection Time    11/18/2013  9:39 AM      Result Value Ref Range Status   Specimen Description URINE, CATHETERIZED   Final   Special Requests NONE   Final   Culture  Setup Time     Final   Value: 12/13/2013 18:51     Performed at [facility]   Colony Count     Final   Value: NO GROWTH     [facility]   Culture     Final   Value: NO GROWTH     [facility]   Report Status 12/11/2013 FINAL   Final  CULTURE, BLOOD (ROUTINE X 2)     Status: None   Collection Time    11/13/2013 10:00 AM      Result Value Ref Range Status    Specimen Description BLOOD RIGHT HAND   Final   Special Requests BOTTLES DRAWN AEROBIC ONLY 6CC   Final   Culture  Setup Time     Final   Value: 12/04/2013 18:44     [facility]formed [facility]   Culture     Final   Value:        BLOOD CULTURE RECEIVED NO GROWTH TO DATE CULTURE WILL BE HELD FOR 5 DAYS BEFORE ISSUING A FINAL NEGATIVE REPORT     [facility]   Report Status PENDING   Incomplete  CULTURE, BLOOD (ROUTINE X 2)     Status: None   Collection Time    11/27/2013 10:15 AM      Result Value Ref Range Status   Specimen Description BLOOD RIGHT HAND   Final   Special Requests BOTTLES DRAWN AEROBIC ONLY 5CC   Final   Culture  Setup Time     Final   Value: 12/09/2013 18:44     [facility]formed [facility]   Culture     Final   Value:        BLOOD CULTURE RECEIVED NO GROWTH TO DATE CULTURE WILL BE HELD FOR 5 DAYS BEFORE ISSUING A FINAL NEGATIVE REPORT     [facility]   Report Status PENDING   Incomplete  CULTURE, RESPIRATORY (NON-EXPECTORATED)     Status: None   Collection Time    12/11/13  4:32 AM      Result Value Ref Range Status   Specimen Description TRACHEAL ASPIRATE   Final   Special Requests NONE   Final   Gram Stain     Final   Value: MODERATE WBC PRESENT, PREDOMINANTLY PMN     FEW SQUAMOUS EPITHELIAL CELLS PRESENT     NO ORGANISMS SEEN     [facility]   Culture     Final   Value: Non-Pathogenic Oropharyngeal-type Flora Isolated.     [facility]formed at [facility]   Report Status 12/13/2013 FINAL   Final  CULTURE, BLOOD (ROUTINE X 2)     Status: None   Collection Time    12/11/13  6:50 AM      Result Value Ref Range Status   Specimen Description BLOOD RIGHT PICC LINE   Final   Special Requests BOTTLES DRAWN AEROBIC AND ANAEROBIC 10CC   Final   Culture  Setup Time     Final   Value: 12/11/2013 16:53     [facility]formed at [facility]   Culture  Final   Value:        BLOOD CULTURE  RECEIVED NO GROWTH TO DATE CULTURE WILL BE HELD FOR 5 DAYS BEFORE ISSUING A FINAL NEGATIVE REPORT     Performed at [facility]   Report Status PENDING   Incomplete       Assessment & Plan   VDRF ; continue with mechanical ventilation, weaning per protocol ,and respiratory rate of 20. / Neurologic etiology Sepsis with septic shock continue with vancomycin and meropenem A. fib with RVR anticoagulation on hold due to to anemia, continue with digoxin, Cardizem, and metoprolol Altered mental status Acute on chronic renal injury, start IV fluids D5 border from the HypERnatremia increase free fluids, start D5 water Diarrhea continue with flexiseal Anemia status post blood transfusion, positive blood per stools Coronary artery disease status post CABG CVA on aspirin and Crestor Peripheral vascular disease we'll hold aspirin today Left hip contusion status post arthroplasty Agitation/delirium continue with fentanyl and Versed COPD continue with neb treatments Diabetes mellitus type 2 on Levemir and I SS  hyperkalemia   Plan Hold Lasix Continue with D5 water Discussed with family regarding DO NOT RESUSCITATE Discussed with pulmonary critical care  Code Status:  full   DVT Prophylaxis SCDs   Carron Curie M.D on 12/14/2013 at 1:02 PM

## 2013-12-14 NOTE — Progress Notes (Signed)
PULMONARY / CRITICAL CARE MEDICINE   Name: Ronald Frank MRN: [id number] DOB: 05/19/1935    CONSULTATION DATE:  11/29/2013  CHIEF COMPLAINT:  Respiratory failure  BRIEF PATIENT DESCRIPTION: 79 yo admitted 3/16 after fall resulting in L hip contusion. Course is complicated by pneumonia, septic shock, respiratory failure requiring intubation and eventually tracheostomy.  INTERVAL HISTORY: Remains severely encephalopathic, asynchronous, fevers persist   PHYSICAL EXAMINATION: Vital signs:  Reviwed General:. Sedated on vent  Neuro:  Agitated  HEENT: WNL Cardiovascular:  IRIR, tachy, no M Lungs:scattered rhonchi  Abdomen: BS x 4, soft, NT/ND.  Ext: symmetric trace-1+ edema  LABS:  Recent Labs Lab 12/12/13 0643 12/13/13 0540 12/14/13 0645  HGB 8.4* 9.3* 10.9*  HCT 24.9* 29.2* 33.2*  WBC 15.7* 17.1* 20.9*  PLT 315 334 475*    Recent Labs Lab 12/12/2013 0500 12/11/13 0653 12/12/13 0643 12/13/13 0540 12/14/13 0905  NA 145 147 149* 156* 154*  K 4.6 4.3 5.6* 4.8 5.5*  CL 109 111 113* 118* 116*  CO2 22 24 22 24 24   GLUCOSE 309* 278* 371* 183* 286*  BUN 68* 72* 88* 89* 91*  CREATININE 1.70* 1.73* 1.89* 1.93* 1.81*  CALCIUM 7.7* 7.8* 7.8* 8.0* 7.8*   IMAGING: Dg Chest Port 1 View  12/13/2013   CLINICAL DATA:  Respiratory failure  EXAM: PORTABLE CHEST - 1 VIEW  COMPARISON:  CT angio chest of 12/11/2013 and chest x-ray of 11/29/2013  FINDINGS: The patchy infiltrate in the periphery of the right mid upper lung is stable and most consistent with pneumonia. There is cardiomegaly present with probable mild pulmonary vascular congestion. There may be small effusions present. Tracheostomy and an NG tube remain.  IMPRESSION: No significant change in probable patchy pneumonia in the periphery of the right mid upper lung. Question superimposed mild edema.   Electronically Signed   By: Dwyane DeePaul  Barry M.D.   On: 12/13/2013 08:09   Dg Abd Portable 1v  12/14/2013   CLINICAL DATA:  Check NG tube  position  EXAM: PORTABLE ABDOMEN - 1 VIEW  COMPARISON:  DG ABD PORTABLE 1V dated 11/21/2013; CT ANGIO CHEST W/CM &/OR WO/CM dated 12/11/2013  FINDINGS: Nasogastric tube tip in the region of the gastric antrum. The bowel gas pattern is nonobstructed. Lung bases are clear. No abnormal intra-abdominal mass effect or calcification.  IMPRESSION: Nasogastric tube tip at the distal stomach.   Electronically Signed   By: Tiburcio PeaJonathan  Watts M.D.   On: 12/14/2013 06:13   ASSESSMENT: Acute respiratory failure ARDS Pulmonary edema MSSA pneumonia Tracheostomy status Acute encephalopathy / delirium Mediastinal lymphadenopathy  PLAN: Full mechanical support Hold weaning as high Ve Antibiotics per primary service  Observe off sedation  Hold diuresis Would recommend DNR status with no escalation of care, potentially comfort measures ( this was discussed at length with patient's son who was present at bedside during rounds  BABCOCK,PETE  12/14/2013, 8:53 AM  I have personally obtained history, examined patient, evaluated and interpreted laboratory and imaging results, reviewed medical records, formulated assessment / plan and placed orders.  Lonia FarberZUBELEVITSKIY, Mario Voong, MD Pulmonary and Critical Care Medicine [facility] Pager: [phone]  12/14/2013, 1:58 PM    .

## 2013-12-14 DEATH — deceased

## 2013-12-15 ENCOUNTER — Inpatient Hospital Stay (HOSPITAL_BASED_OUTPATIENT_CLINIC_OR_DEPARTMENT_OTHER)
Admission: RE | Admit: 2013-12-15 | Discharge: 2013-12-15 | Disposition: A | Discharge status: Home / Self Care | Payer: Medicare Other | Attending: Internal Medicine | Admitting: Internal Medicine

## 2013-12-15 DIAGNOSIS — G9340 Encephalopathy, unspecified: Secondary | ICD-10-CM

## 2013-12-15 LAB — BASIC METABOLIC PANEL
BUN: 88 mg/dL — ABNORMAL HIGH (ref 6–23)
CALCIUM: 7.4 mg/dL — AB (ref 8.4–10.5)
CHLORIDE: 117 meq/L — AB (ref 96–112)
CO2: 25 meq/L (ref 19–32)
Creatinine, Ser: 1.77 mg/dL — ABNORMAL HIGH (ref 0.50–1.35)
GFR calc Af Amer: 41 mL/min — ABNORMAL LOW (ref >90)
GFR calc non Af Amer: 35 mL/min — ABNORMAL LOW (ref >90)
GLUCOSE: 304 mg/dL — AB (ref 70–99)
Potassium: 4.6 mEq/L (ref 3.7–5.3)
SODIUM: 156 meq/L — AB (ref 137–147)

## 2013-12-15 LAB — CBC
HCT: 30.4 % — ABNORMAL LOW (ref 39.0–52.0)
HEMOGLOBIN: 9.5 g/dL — AB (ref 13.0–17.0)
MCH: 32 pg (ref 26.0–34.0)
MCHC: 31.3 g/dL (ref 30.0–36.0)
MCV: 102.4 fL — ABNORMAL HIGH (ref 78.0–100.0)
Platelets: 267 10*3/uL (ref 150–400)
RBC: 2.97 MIL/uL — ABNORMAL LOW (ref 4.22–5.81)
RDW: 18.7 % — ABNORMAL HIGH (ref 11.5–15.5)
WBC: 19.6 10*3/uL — ABNORMAL HIGH (ref 4.0–10.5)

## 2013-12-15 NOTE — Progress Notes (Signed)
EEG completed; results pending.    

## 2013-12-15 NOTE — Progress Notes (Signed)
[facility]                                                                                              Progress note     Patient Demographics  Ronald Frank, is a 79 y.o. male  ZOX:[id number]  WJX:[id number]  DOB - 1934-11-25  Admit date - 14-Dec-2013  Admitting Physician Elnora Morrison, MD  Outpatient Primary MD for the patient is Junious Silk, MD  LOS - 5   Chief complaint  respiratory failure  Sepsis  Atrial fib with RVR           Subjective:   Unable to obtain due to patient's condition  Objective:   Vital signs  Temperature 99.2  Heart rate 88 Respiratory rate 31 Blood pressure 179/41  Pulse ox 94%    Exam Obtunded, shallow breaths,, No new F.N deficits,  [place].AT, moist mucous membranes , pinpoint pupils  NG tube in place Supple Neck,No JVD, No cervical lymphadenopathy appriciated. Tracheostomy and midline noted  Symmetrical Chest wall movement,  decreased breath sounds bilaterally  RRR,No Gallops,Rubs or new Murmurs, No Parasternal Heave +ve B.Sounds, Abd Soft, Non tender, No organomegaly appriciated, No rebound - guarding or rigidity. No Cyanosis, Clubbing or edema, No new Rash or bruise     I&Os 1900/500  NG tube yes Foley yes Trach yes  Data Review   CBC  Recent Labs Lab 12/11/13 0653 12/12/13 0534 12/12/13 0643 12/13/13 0540 12/14/13 0645 12/15/13 0540  WBC 18.5*  --  15.7* 17.1* 20.9* 19.6*  HGB 6.2* 8.6* 8.4* 9.3* 10.9* 9.5*  HCT 19.6* 25.6* 24.9* 29.2* 33.2* 30.4*  PLT 325  --  315 334 475* 267  MCV 103.7*  --  96.1 99.7 99.7 102.4*  MCH 32.8  --  32.4 31.7 32.7 32.0  MCHC 31.6  --  33.7 31.8 32.8 31.3  RDW 15.9*  --  21.3* 20.6* 19.1* 18.7*  LYMPHSABS 1.1  --   --   --   --   --   MONOABS 0.6  --   --   --   --   --   EOSABS 0.2  --   --   --   --   --   BASOSABS 0.0  --   --   --   --   --     Chemistries   Recent Labs Lab  12/11/13 0653 12/12/13 0643 12/13/13 0540 12/14/13 0905 12/15/13 0540  NA 147 149* 156* 154* 156*  K 4.3 5.6* 4.8 5.5* 4.6  CL 111 113* 118* 116* 117*  CO2 24 22 24 24 25   GLUCOSE 278* 371* 183* 286* 304*  BUN 72* 88* 89* 91* 88*  CREATININE 1.73* 1.89* 1.93* 1.81* 1.77*  CALCIUM 7.8* 7.8* 8.0* 7.8* 7.4*  AST 68*  --   --   --   --   ALT 118*  --   --   --   --   ALKPHOS 54  --   --   --   --   BILITOT <0.2*  --   --   --   --  No results found for this basename: TSH, T4TOTAL, FREET3, T3FREE, THYROIDAB,  in the last 72 hours Coagulation profile  Recent Labs Lab 12/09/13 0430 12/11/13 0653  INR 1.22 1.30    No results found for this basename: DDIMER,  in the last 72 hours  Cardiac Enzymes  Recent Labs Lab 11/19/2013 0838 12/11/13 1645  TROPONINI <0.30 <0.30   ------------------------------------------------------------------------------------------------------------------ No components found with this basename: POCBNP,   Micro Results Recent Results (from the past 240 hour(s))  CULTURE, BLOOD (ROUTINE X 2)     Status: None   Collection Time    12/06/13 10:50 AM      Result Value Ref Range Status   Specimen Description BLOOD LEFT ANTECUBITAL   Final   Special Requests BOTTLES DRAWN AEROBIC AND ANAEROBIC 10CC   Final   Culture  Setup Time     Final   Value: 12/06/2013 17:54     Performed at [facility]   Culture     Final   Value: NO GROWTH 5 DAYS     Performed at [facility]   Report Status 12/12/2013 FINAL   Final  CULTURE, BLOOD (ROUTINE X 2)     Status: None   Collection Time    12/06/13 11:04 AM      Result Value Ref Range Status   Specimen Description BLOOD LEFT HAND   Final   Special Requests BOTTLES DRAWN AEROBIC ONLY 3CC   Final   Culture  Setup Time     Final   Value: 12/06/2013 17:50     Performed at [facility]   Culture     Final   Value: STAPHYLOCOCCUS SPECIES (COAGULASE NEGATIVE)     Note: THE SIGNIFICANCE OF  ISOLATING THIS ORGANISM FROM A SINGLE SET OF BLOOD CULTURES WHEN MULTIPLE SETS ARE DRAWN IS UNCERTAIN. PLEASE NOTIFY THE MICROBIOLOGY DEPARTMENT WITHIN ONE WEEK IF SPECIATION AND SENSITIVITIES ARE REQUIRED.     Note: Gram Stain Report Called to,Read Back By and Verified With: LINDSAY VERNER ON 12/07/2013 AT 8:43P BY WILEJ     Performed at [facility]   Report Status 12/08/2013 FINAL   Final  CULTURE, RESPIRATORY (NON-EXPECTORATED)     Status: None   Collection Time    12/06/13 12:59 PM      Result Value Ref Range Status   Specimen Description TRACHEAL ASPIRATE   Final   Special Requests NONE   Final   Gram Stain     Final   Value: RARE WBC PRESENT,BOTH PMN AND MONONUCLEAR     NO SQUAMOUS EPITHELIAL CELLS SEEN     NO ORGANISMS SEEN     Performed at [facility]   Culture     Final   Value: Non-Pathogenic Oropharyngeal-type Flora Isolated.     Performed at [facility]   Report Status 12/08/2013 FINAL   Final  URINE CULTURE     Status: None   Collection Time    12/02/2013  9:39 AM      Result Value Ref Range Status   Specimen Description URINE, CATHETERIZED   Final   Special Requests NONE   Final   Culture  Setup Time     Final   Value: 11/24/2013 18:51     Performed at [facility] Count     Final   Value: NO GROWTH     Performed at [facility]   Culture     Final   Value:  NO GROWTH     Performed at [facility]   Report Status 12/11/2013 FINAL   Final  CULTURE, BLOOD (ROUTINE X 2)     Status: None   Collection Time    2013-12-25 10:00 AM      Result Value Ref Range Status   Specimen Description BLOOD RIGHT HAND   Final   Special Requests BOTTLES DRAWN AEROBIC ONLY [facility]   Final   Culture  Setup Time     Final   Value: 12/25/13 18:44     Performed at [facility]   Culture     Final   Value:        BLOOD CULTURE RECEIVED NO GROWTH TO DATE CULTURE WILL BE HELD FOR 5 DAYS BEFORE ISSUING A FINAL NEGATIVE REPORT      Performed at [facility]   Report Status PENDING   Incomplete  CULTURE, BLOOD (ROUTINE X 2)     Status: None   Collection Time    12-25-2013 10:15 AM      Result Value Ref Range Status   Specimen Description BLOOD RIGHT HAND   Final   Special Requests BOTTLES DRAWN AEROBIC ONLY 5CC   Final   Culture  Setup Time     Final   Value: 12/25/2013 18:44     Performed at [facility]   Culture     Final   Value:        BLOOD CULTURE RECEIVED NO GROWTH TO DATE CULTURE WILL BE HELD FOR 5 DAYS BEFORE ISSUING A FINAL NEGATIVE REPORT     Performed at [facility]   Report Status PENDING   Incomplete  CULTURE, RESPIRATORY (NON-EXPECTORATED)     Status: None   Collection Time    12/11/13  4:32 AM      Result Value Ref Range Status   Specimen Description TRACHEAL ASPIRATE   Final   Special Requests NONE   Final   Gram Stain     Final   Value: MODERATE WBC PRESENT, PREDOMINANTLY PMN     FEW SQUAMOUS EPITHELIAL CELLS PRESENT     NO ORGANISMS SEEN     Performed at [facility]   Culture     Final   Value: Non-Pathogenic Oropharyngeal-type Flora Isolated.     Performed at [facility]   Report Status 12/13/2013 FINAL   Final  CULTURE, BLOOD (ROUTINE X 2)     Status: None   Collection Time    12/11/13  6:50 AM      Result Value Ref Range Status   Specimen Description BLOOD RIGHT PICC LINE   Final   Special Requests BOTTLES DRAWN AEROBIC AND ANAEROBIC 10CC   Final   Culture  Setup Time     Final   Value: 12/11/2013 16:53     Performed at [facility]   Culture     Final   Value:        BLOOD CULTURE RECEIVED NO GROWTH TO DATE CULTURE WILL BE HELD FOR 5 DAYS BEFORE ISSUING A FINAL NEGATIVE REPORT     Performed at [facility]   Report Status PENDING   Incomplete       Assessment & Plan   VDRF ; continue with mechanical ventilation, weaning per protocol ,and respiratory rate of 20. ? Neurologic etiology Sepsis with septic shock  continue with vancomycin and meropenem A. fib with RVR anticoagulation on hold due to to anemia, continue with  digoxin, Cardizem, and metoprolol Altered mental status? Anoxic brain injury Acute on chronic renal injury, start IV fluids D5 W HypERnatremia increase free fluids, continue with D5 water Diarrhea continue with flexiseal Anemia status post blood transfusion, positive blood per stools Coronary artery disease status post CABG continue with aspirin and Crestor Peripheral vascular disease we'll hold aspirin today Left hip contusion status post arthroplasty Agitation/delirium continue with fentanyl and Versed COPD continue with neb treatments Diabetes mellitus type 2 on Levemir and I SS  hyperkalemia   Plan Continue with D5 water Continue with IV antibiotics  discussed with family and we made him DO NOT RESUSCITATE family asked me for an EEG before further          decisions are made .  Critical care time; 34 minutes    Code Status:  full   DVT Prophylaxis SCDs   Carron CurieHijazi, Yashas Camilli M.D on 12/15/2013 at 2:35 PM

## 2013-12-15 NOTE — Procedures (Signed)
ELECTROENCEPHALOGRAM REPORT  Patient: Ronald Frank       Room #: [facility]SH 5710 EEG No. ID: [id number] Age: 79 y.o.        Sex: male Referring Physician: Carron CurieAli Hijazi Report Date:  12/15/2013        Interpreting Physician: Aline BrochureSTEWART,Ellar Hakala R  History: Ronald Frank is an 79 y.o. male with a history of persistent encephalopathy following pneumonia and septic shock as well as respiratory failure requiring intubation and subsequent tracheostomy. Patient has also had persistent fever.  Indications for study:  Assess severity of encephalopathy; rule out seizure activity.  Technique: This is an 18 channel routine scalp EEG performed at the bedside with bipolar and monopolar montages arranged in accordance to the international 10/20 system of electrode placement.   Description: Patient was noted to be unresponsive to verbal stimulation during the study. Predominant background activity consisted of an alternating pattern of very low amplitude delta activity lasting for several seconds and occurrences of slightly higher amplitude 5-6 Hz slightly rhythmic theta activity diffusely. Photic stimulation was not performed. There was no change in background activity with verbal stimulation. No epileptiform activity was recorded.  Interpretation: This EEG is abnormal with a pattern of severe swelling of cerebral activity with a somewhat burst-suppression appearance, most often seen with severe hypoxic encephalopathy. No evidence of an epileptic disorder was demonstrated.   Venetia MaxonR Hang Ammon M.D. [facility] [phone]

## 2013-12-16 ENCOUNTER — Other Ambulatory Visit (HOSPITAL_COMMUNITY): Payer: Self-pay

## 2013-12-16 LAB — CBC
HCT: 30.6 % — ABNORMAL LOW (ref 39.0–52.0)
Hemoglobin: 9.3 g/dL — ABNORMAL LOW (ref 13.0–17.0)
MCH: 32.3 pg (ref 26.0–34.0)
MCHC: 30.4 g/dL (ref 30.0–36.0)
MCV: 106.3 fL — AB (ref 78.0–100.0)
PLATELETS: 258 10*3/uL (ref 150–400)
RBC: 2.88 MIL/uL — AB (ref 4.22–5.81)
RDW: 18.4 % — AB (ref 11.5–15.5)
WBC: 20.2 10*3/uL — AB (ref 4.0–10.5)

## 2013-12-16 LAB — BASIC METABOLIC PANEL
BUN: 132 mg/dL — ABNORMAL HIGH (ref 6–23)
CHLORIDE: 110 meq/L (ref 96–112)
CO2: 23 meq/L (ref 19–32)
CREATININE: 3.69 mg/dL — AB (ref 0.50–1.35)
Calcium: 7.5 mg/dL — ABNORMAL LOW (ref 8.4–10.5)
GFR calc Af Amer: 17 mL/min — ABNORMAL LOW (ref >90)
GFR calc non Af Amer: 15 mL/min — ABNORMAL LOW (ref >90)
Glucose, Bld: 237 mg/dL — ABNORMAL HIGH (ref 70–99)
POTASSIUM: 7.1 meq/L — AB (ref 3.7–5.3)
Sodium: 149 mEq/L — ABNORMAL HIGH (ref 137–147)

## 2013-12-16 LAB — CULTURE, BLOOD (ROUTINE X 2)
CULTURE: NO GROWTH
CULTURE: NO GROWTH

## 2013-12-17 LAB — CULTURE, BLOOD (ROUTINE X 2): CULTURE: NO GROWTH

## 2014-01-04 ENCOUNTER — Ambulatory Visit: Payer: Medicare Other | Admitting: Podiatry

## 2014-01-13 DEATH — deceased

## 2014-05-08 ENCOUNTER — Other Ambulatory Visit (HOSPITAL_COMMUNITY): Payer: Medicare Other

## 2014-05-08 ENCOUNTER — Ambulatory Visit: Payer: Medicare Other | Admitting: Family

## 2014-05-08 ENCOUNTER — Encounter (HOSPITAL_COMMUNITY): Payer: Medicare Other

## 2014-08-24 ENCOUNTER — Encounter (HOSPITAL_COMMUNITY): Payer: Self-pay | Admitting: Surgery

## 2015-01-15 IMAGING — CR DG CHEST 1V PORT
1 series · 1 of 1 positions shown · non-contrast
Comparison: Prior chest x-ray 12/04/2013

CLINICAL DATA: Acute respiratory distress syndrome

EXAM:
PORTABLE CHEST - 1 VIEW

[AP]
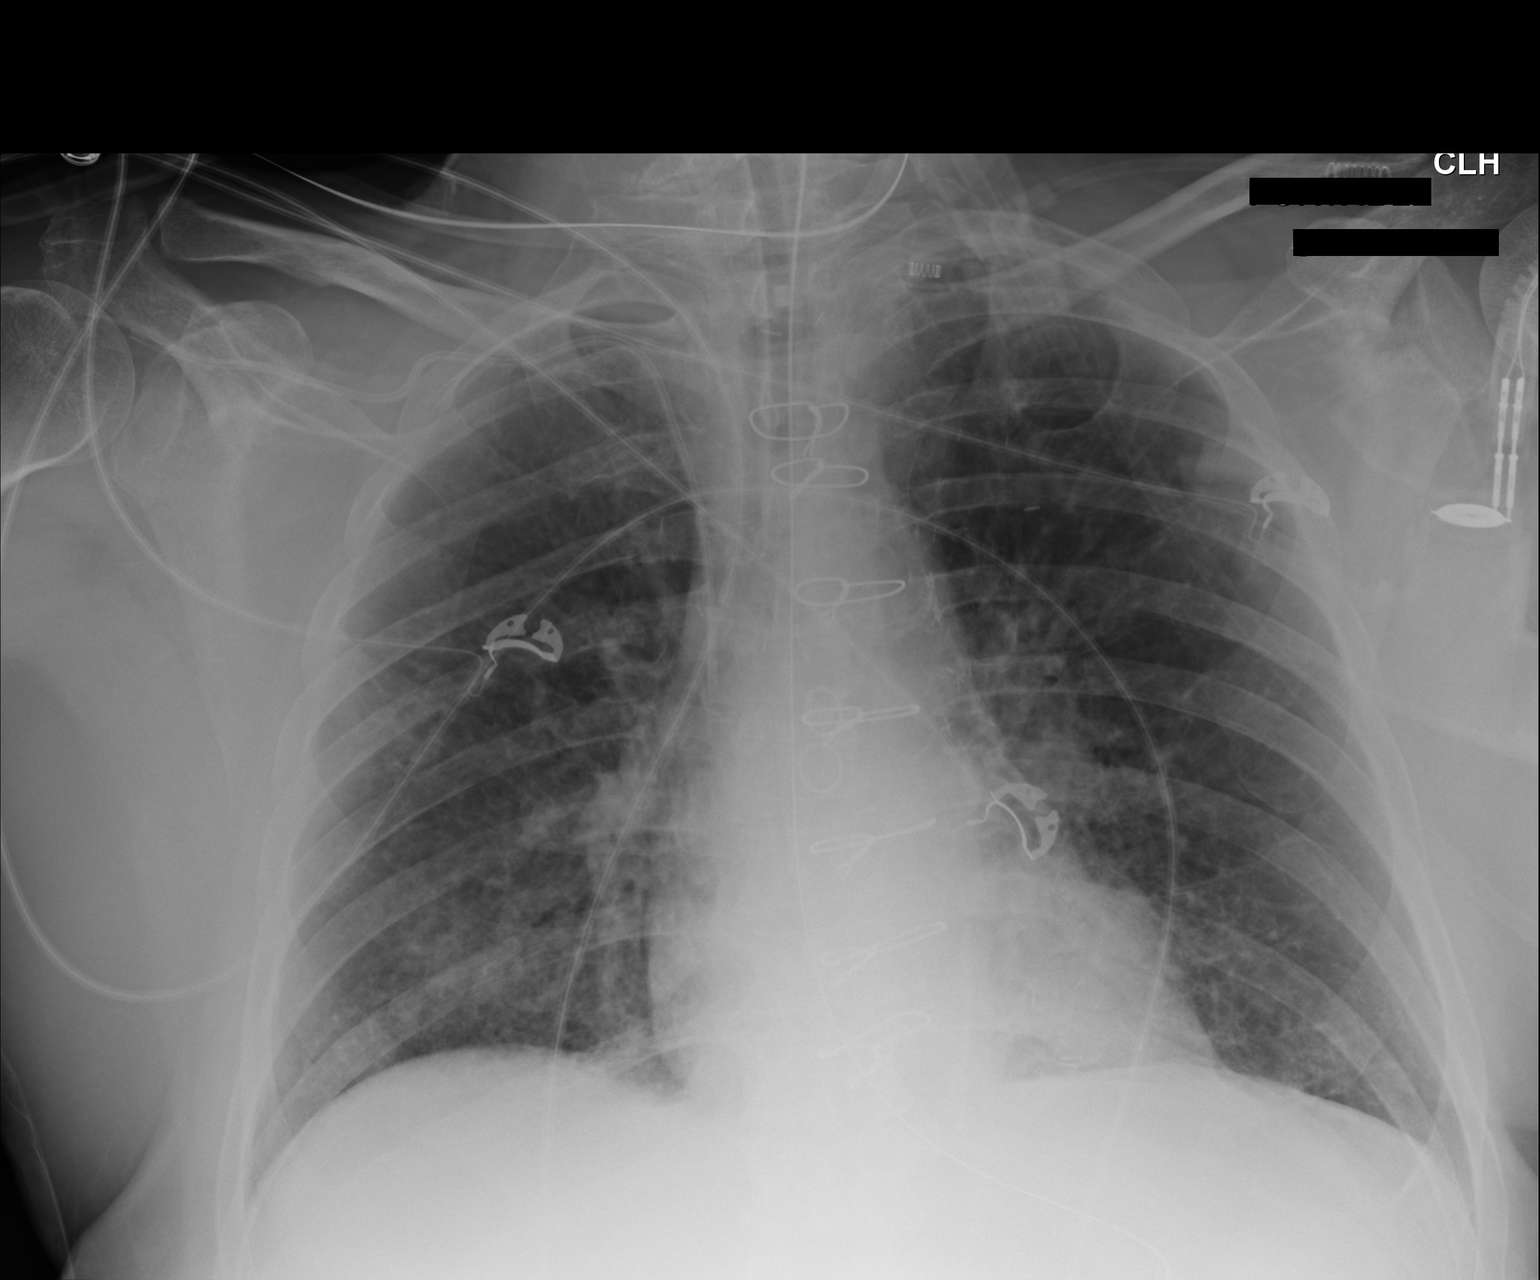

[1 of 1 positions shown; findings below may reference images not displayed]

FINDINGS: The patient is intubated. The tip of the endotracheal tube is 2.5 cm
above the carina. The tip of the nasogastric tube is not identified
but lies off the field of view presumably within the stomach. A
right subclavian central venous catheter is present. The tip
projects over the distal SVC. Cardiac and mediastinal contours are
within normal limits. The patient is status post median sternotomy
with evidence prior multivessel CABG including [REDACTED] bypass. There is
been no significant interval change in the appearance of the lungs.
Persistent bilateral perihilar and basilar patchy interstitial and
airspace opacities. No pneumothorax or pleural effusion. No acute
osseous abnormality.
IMPRESSION: 1. No significant interval change in the appearance of the chest.
Persistent bilateral perihilar and basilar patchy airspace
opacities.
2. Stable and satisfactory support apparatus.

## 2015-01-18 IMAGING — CR DG CHEST 1V PORT
1 series · 1 of 1 positions shown · non-contrast
Comparison: 12/07/2013

CLINICAL DATA: Shortness of breath

EXAM:
PORTABLE CHEST - 1 VIEW

[AP]
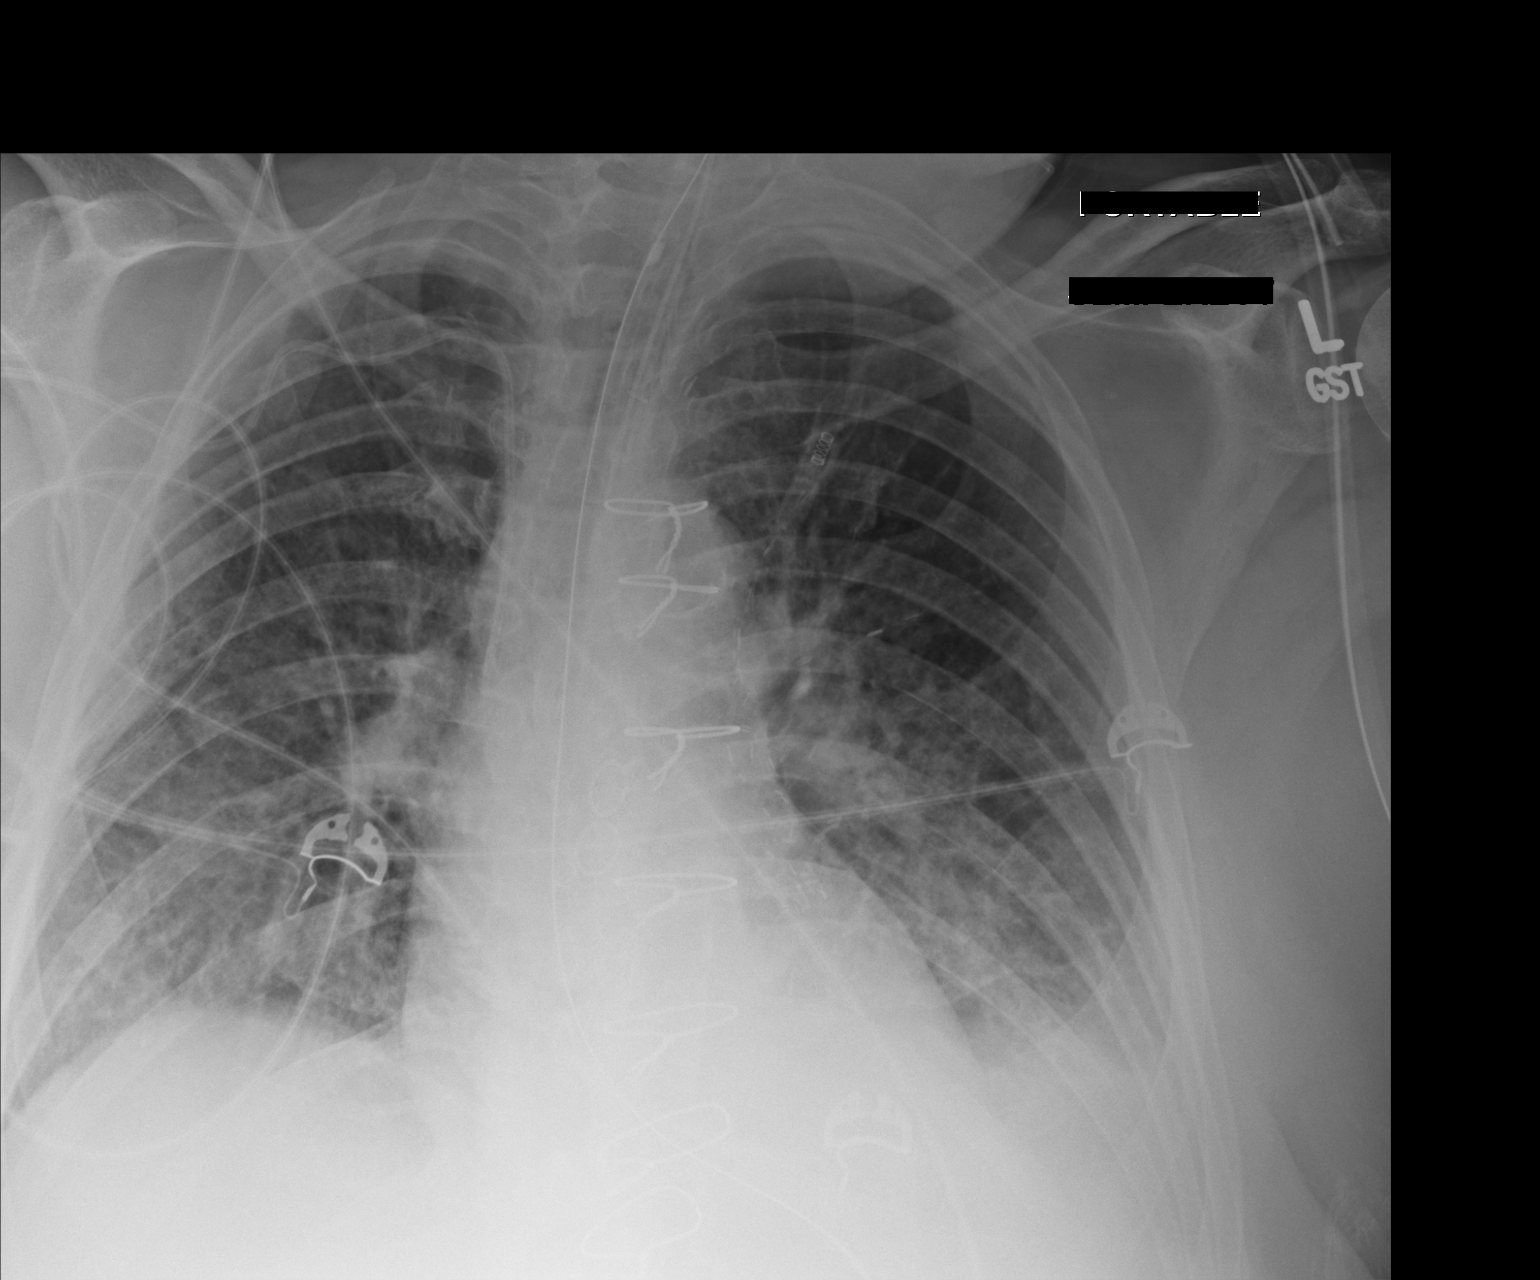

[1 of 1 positions shown; findings below may reference images not displayed]

FINDINGS: An endotracheal tube is again identified in satisfactory position
approximately 3.5 cm above the carina. Cardiac shadow is stable.
Postsurgical changes are again seen. A nasogastric catheter is noted
within the stomach. Stable right-sided central venous line.
Increasing left-sided pleural effusion is noted. Mild interstitial
edema is again seen.
IMPRESSION: Stable interstitial edema.

New left-sided pleural effusion.

## 2015-01-19 IMAGING — DX DG CHEST 1V PORT
1 series · 1 of 1 positions shown · non-contrast
Comparison: DG CHEST 1V PORT dated 12/08/2013

CLINICAL DATA: Tube placement and rule out pneumothorax.

EXAM:
PORTABLE CHEST - 1 VIEW

[portable]
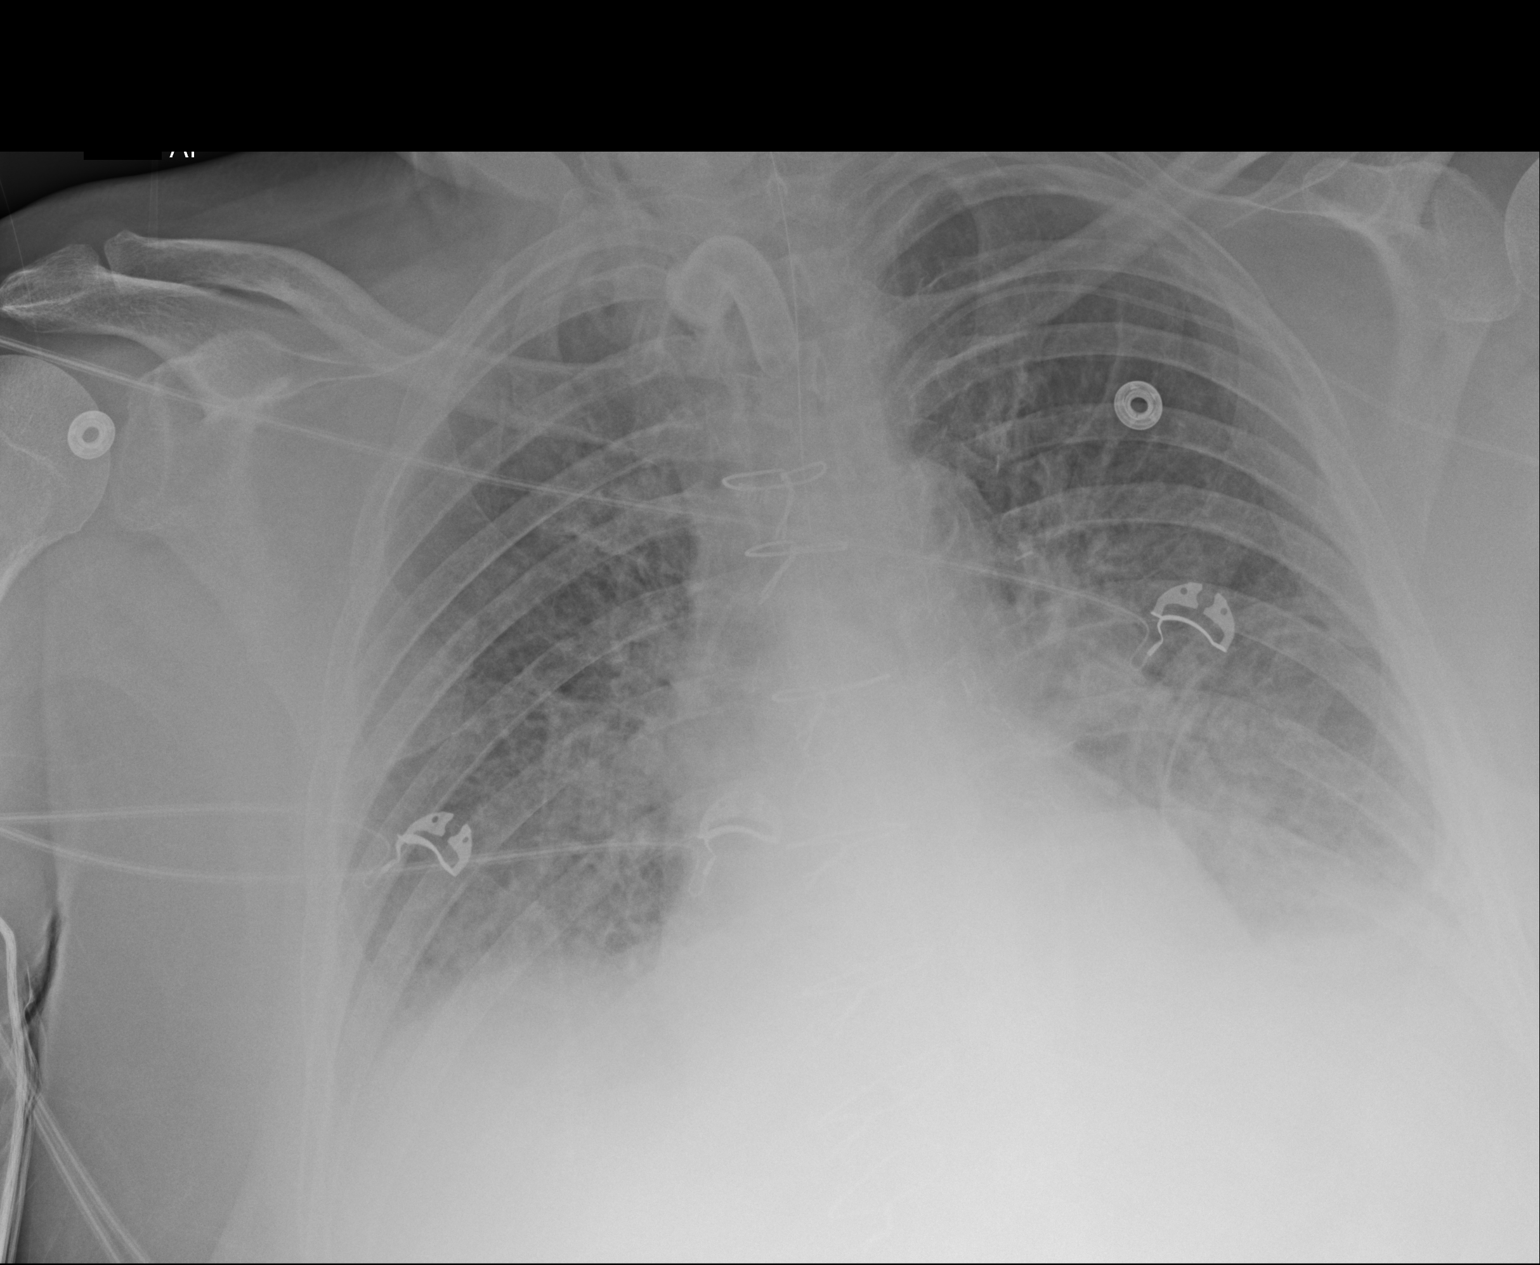

[1 of 1 positions shown; findings below may reference images not displayed]

FINDINGS: The endotracheal tube has been removed and the patient now has a
tracheostomy tube that appears to be well positioned above the
carina. There is a nasogastric tube but the distal aspect is poorly
characterized. There are increased interstitial densities in both
lungs suggesting edema. Bibasilar densities suggest atelectasis and
possibly small effusions. Negative for a pneumothorax. Left arm PICC
line tip in the upper SVC region.
IMPRESSION: Negative for a pneumothorax following placement of tracheostomy
tube.

Increased interstitial densities and bibasilar densities. Findings
suggest interstitial pulmonary edema and cannot exclude small
effusions.

## 2015-01-20 IMAGING — CR DG CHEST 1V PORT
1 series · 1 of 1 positions shown · non-contrast
Comparison: Earlier today.

CLINICAL DATA: Respiratory distress.

EXAM:
PORTABLE CHEST - 1 VIEW

[AP]
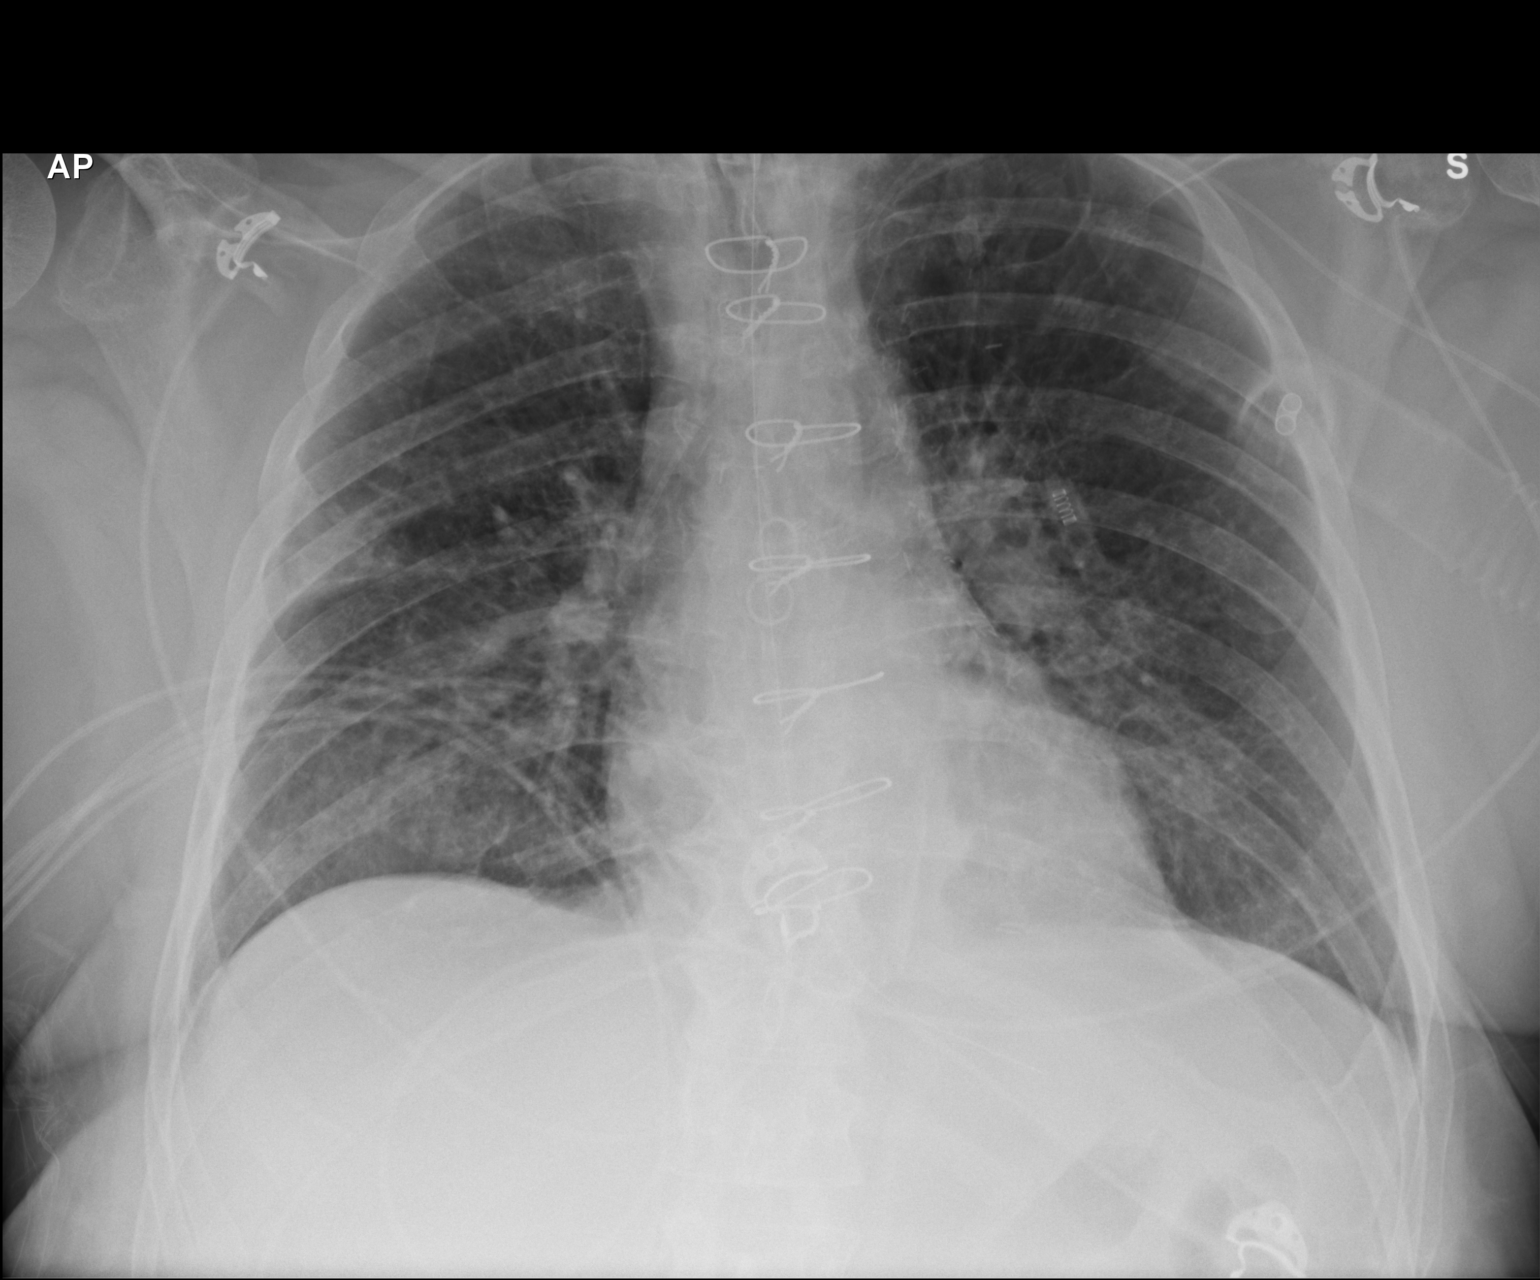

[1 of 1 positions shown; findings below may reference images not displayed]

FINDINGS: The tracheostomy tube remains in satisfactory position. Stable post
CABG changes. Nasogastric tube tip in the proximal stomach.
Prominent interstitial markings with improvement. No significant
change in a patchy density in the inferior aspect of the right upper
lobe, laterally.
IMPRESSION: 1. Stable probable small area of pneumonia in the inferior right
upper lobe.
2. Improving interstitial pulmonary edema superimposed on chronic
interstitial lung disease.

## 2015-01-24 IMAGING — DX DG ABD PORTABLE 1V
1 series · 1 of 1 positions shown · non-contrast
Comparison: DG ABD PORTABLE 1V dated 12/10/2013; CT ANGIO CHEST W/CM
&/OR WO/CM dated 12/11/2013

CLINICAL DATA: Check NG tube position

EXAM:
PORTABLE ABDOMEN - 1 VIEW

[supine ap]
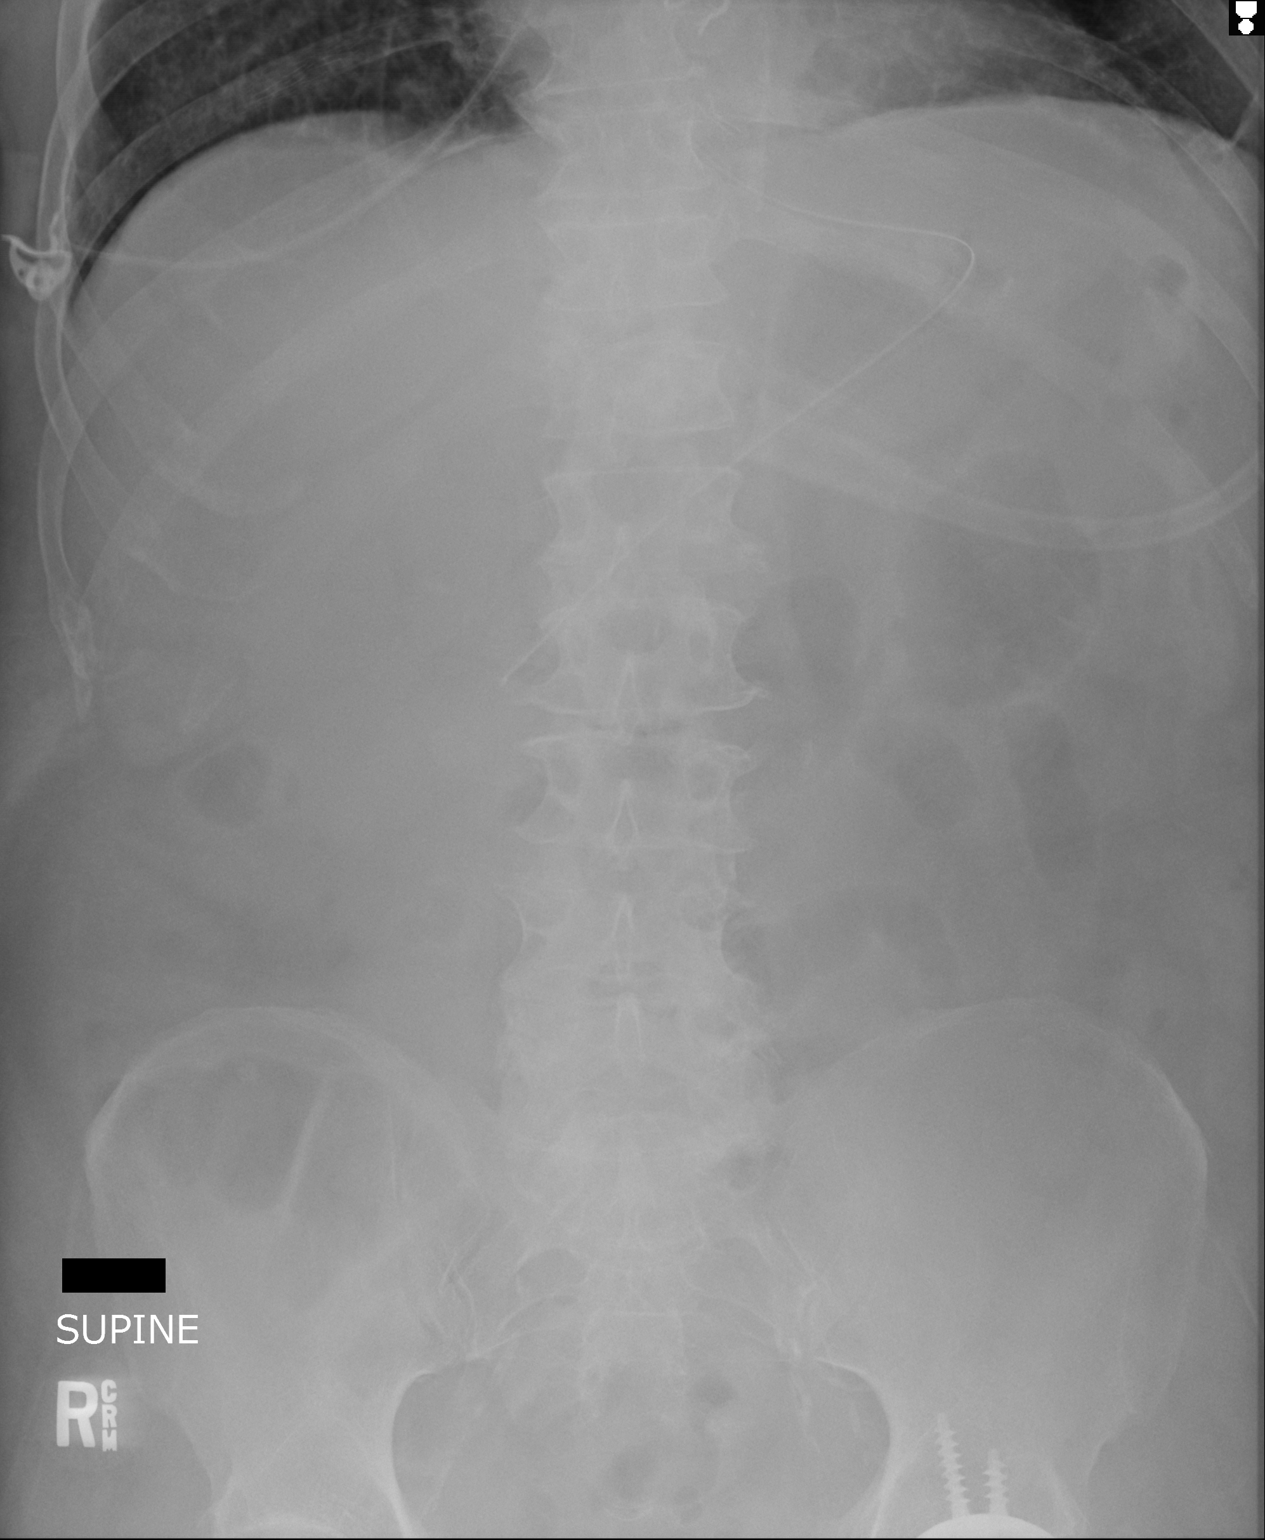

[1 of 1 positions shown; findings below may reference images not displayed]

FINDINGS: Nasogastric tube tip in the region of the gastric antrum. The bowel
gas pattern is nonobstructed. Lung bases are clear. No abnormal
intra-abdominal mass effect or calcification.
IMPRESSION: Nasogastric tube tip at the distal stomach.
